# Patient Record
Sex: Female | Born: 1990 | Race: Black or African American | Hispanic: No | Marital: Married | Smoking: Never smoker
Health system: Southern US, Community
[De-identification: ages and names within clinical notes are randomized; demographics above are authoritative.]

## PROBLEM LIST (undated history)

## (undated) ENCOUNTER — Inpatient Hospital Stay: Payer: Self-pay

## (undated) DIAGNOSIS — G43909 Migraine, unspecified, not intractable, without status migrainosus: Secondary | ICD-10-CM

## (undated) DIAGNOSIS — F419 Anxiety disorder, unspecified: Secondary | ICD-10-CM

## (undated) DIAGNOSIS — R569 Unspecified convulsions: Secondary | ICD-10-CM

## (undated) DIAGNOSIS — Z98891 History of uterine scar from previous surgery: Secondary | ICD-10-CM

## (undated) DIAGNOSIS — D649 Anemia, unspecified: Secondary | ICD-10-CM

## (undated) DIAGNOSIS — R519 Headache, unspecified: Secondary | ICD-10-CM

## (undated) DIAGNOSIS — R112 Nausea with vomiting, unspecified: Secondary | ICD-10-CM

## (undated) DIAGNOSIS — Z9889 Other specified postprocedural states: Secondary | ICD-10-CM

## (undated) HISTORY — PX: THERAPEUTIC ABORTION: SHX798

## (undated) HISTORY — PX: TYMPANOSTOMY TUBE PLACEMENT: SHX32

## (undated) HISTORY — DX: Migraine, unspecified, not intractable, without status migrainosus: G43.909

## (undated) HISTORY — PX: WISDOM TOOTH EXTRACTION: SHX21

## (undated) HISTORY — PX: TONSILLECTOMY: SUR1361

---
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
## 2009-09-21 ENCOUNTER — Ambulatory Visit: Payer: Self-pay

## 2010-06-29 ENCOUNTER — Ambulatory Visit: Payer: Self-pay | Admitting: Obstetrics and Gynecology

## 2012-12-04 ENCOUNTER — Ambulatory Visit: Payer: Self-pay | Admitting: Internal Medicine

## 2013-01-28 ENCOUNTER — Encounter (INDEPENDENT_AMBULATORY_CARE_PROVIDER_SITE_OTHER): Payer: Self-pay

## 2013-01-28 ENCOUNTER — Ambulatory Visit (INDEPENDENT_AMBULATORY_CARE_PROVIDER_SITE_OTHER): Payer: BC Managed Care – PPO | Admitting: Internal Medicine

## 2013-01-28 ENCOUNTER — Encounter: Payer: Self-pay | Admitting: Internal Medicine

## 2013-01-28 VITALS — BP 114/74 | HR 81 | Temp 98.5°F | Ht 64.0 in | Wt 136.0 lb

## 2013-01-28 DIAGNOSIS — D509 Iron deficiency anemia, unspecified: Secondary | ICD-10-CM

## 2013-01-28 DIAGNOSIS — E538 Deficiency of other specified B group vitamins: Secondary | ICD-10-CM

## 2013-01-28 DIAGNOSIS — D649 Anemia, unspecified: Secondary | ICD-10-CM

## 2013-01-28 DIAGNOSIS — N926 Irregular menstruation, unspecified: Secondary | ICD-10-CM | POA: Insufficient documentation

## 2013-01-28 DIAGNOSIS — Z309 Encounter for contraceptive management, unspecified: Secondary | ICD-10-CM

## 2013-01-28 LAB — CBC WITH DIFFERENTIAL/PLATELET
Basophils Absolute: 0 10*3/uL (ref 0.0–0.1)
Eosinophils Absolute: 0 10*3/uL (ref 0.0–0.7)
HCT: 41.6 % (ref 36.0–46.0)
Hemoglobin: 13.8 g/dL (ref 12.0–15.0)
Lymphocytes Relative: 14 % (ref 12.0–46.0)
Lymphs Abs: 2 10*3/uL (ref 0.7–4.0)
MCHC: 33.3 g/dL (ref 30.0–36.0)
MCV: 89.7 fl (ref 78.0–100.0)
Monocytes Absolute: 0.6 10*3/uL (ref 0.1–1.0)
Monocytes Relative: 4.2 % (ref 3.0–12.0)
Neutro Abs: 11.3 10*3/uL — ABNORMAL HIGH (ref 1.4–7.7)
RBC: 4.63 Mil/uL (ref 3.87–5.11)
RDW: 12.8 % (ref 11.5–14.6)

## 2013-01-28 LAB — COMPREHENSIVE METABOLIC PANEL
ALT: 15 U/L (ref 0–35)
AST: 18 U/L (ref 0–37)
Alkaline Phosphatase: 48 U/L (ref 39–117)
BUN: 7 mg/dL (ref 6–23)
CO2: 25 mEq/L (ref 19–32)
Creatinine, Ser: 0.8 mg/dL (ref 0.4–1.2)
GFR: 96.82 mL/min (ref >60.00)
Sodium: 134 mEq/L — ABNORMAL LOW (ref 135–145)
Total Bilirubin: 0.7 mg/dL (ref 0.3–1.2)

## 2013-01-28 LAB — IRON AND TIBC
%SAT: 5 % — ABNORMAL LOW (ref 20–55)
Iron: 21 ug/dL — ABNORMAL LOW (ref 42–145)
UIBC: 386 ug/dL (ref 125–400)

## 2013-01-28 LAB — VITAMIN B12: Vitamin B-12: 189 pg/mL — ABNORMAL LOW (ref 211–911)

## 2013-01-28 NOTE — Progress Notes (Signed)
Patient ID: Brenda Wang, female   DOB: 1990/03/06, 22 y.o.   MRN: [id number]   Patient Active Problem List   Diagnosis Date Noted  . Anemia, iron deficiency 01/29/2013  . B12 deficiency 01/29/2013  . Unspecified contraceptive management 01/29/2013  . Menstrual irregularity 01/28/2013    Subjective:  CC:   Chief Complaint  Patient presents with  . Menstrual Problem    HPI:   Brenda Wang is a 22 y.o. female who presents as a new patient to establish primary care with the chief complaint of Menstrual irregularities for the past 2 months .   Bleeds for 2 days,  thens stops,  Then spots for a whole day  after 4 or 5 days of no bleeding .   Birth control used has not  changed since June. Had a therapeutic abortion in March and after that   Dr Madelin Headings started her on birth control in June.  She is  Not due for next PAP until June.  She Checks  A UPT monthly at the beginning of each  pack of pills. She is still actively involved with the father of her aborted baby.   Menstrual cycle pre initiation of hormonal contraceptive therapy was normal.  Menses lasted  5 days.  ,  Would start out light x 2 then heavy x 1 day,  then tapers off.  Menses are currently lighter and interrupted by days of no bleeding as previously described.     Lives independently ,since graduating from college in [place].  Degree in Math /Business   Does not exercise.  Gaylyn Rong snot engaged in regular exercise in over a  year.  Has gained 16 lbs  Since  June  .  Diet reviewed.  Lean meats ,   Lot of vegetables.      History reviewed. No pertinent past medical history.  Past Surgical History  Procedure Laterality Date  . Therapeutic abortion      Family History  Problem Relation Age of Onset  . Hypertension Paternal Grandmother   . Diabetes Paternal Grandmother   . Cancer Paternal Grandfather     colon ca    History   Social History  . Marital Status: Single    Spouse Name: N/A    Number of  Children: N/A  . Years of Education: N/A   Occupational History  . Not on file.   Social History Main Topics  . Smoking status: Never Smoker   . Smokeless tobacco: Not on file  . Alcohol Use: 2.4 oz/week    4 Glasses of wine per week  . Drug Use: No  . Sexual Activity: Yes   Other Topics Concern  . Not on file   Social History Narrative  . No narrative on file       @ALLHX @    Review of Systems:   The remainder of the review of systems was negative except those addressed in the HPI.       Objective:  BP 114/74  Pulse 81  Temp(Src) 98.5 F (36.9 C) (Oral)  Ht 5\' 4"  (1.626 m)  Wt 136 lb (61.689 kg)  BMI 23.33 kg/m2  SpO2 95%  General appearance: alert, cooperative and appears stated age Ears: normal TM's and external ear canals both ears Throat: lips, mucosa, and tongue normal; teeth and gums normal Neck: no adenopathy, no carotid bruit, supple, symmetrical, trachea midline and thyroid not enlarged, symmetric, no tenderness/mass/nodules Back: symmetric, no curvature. ROM normal. No CVA tenderness. Lungs: clear  to auscultation bilaterally Heart: regular rate and rhythm, S1, S2 normal, no murmur, click, rub or gallop Abdomen: soft, non-tender; bowel sounds normal; no masses,  no organomegaly Pulses: 2+ and symmetric Skin: Skin color, texture, turgor normal. No rashes or lesions Lymph nodes: Cervical, supraclavicular, and axillary nodes normal.  Assessment and Plan:  Anemia, iron deficiency adding iron supplement and changing OCP to Loestrin FE   B12 deficiency Advised to start IM injections.  Folate RBC level needed   Menstrual irregularity Thyroid function fine,  changing OCPS to Loestrin FE   Unspecified contraceptive management Changing hormonal therapy given irregular menstruation currently.    Updated Medication List Outpatient Encounter Prescriptions as of 01/28/2013  Medication Sig  . norethindrone-ethinyl estradiol-iron (MICROGESTIN  FE,GILDESS FE,LOESTRIN FE) 1.5-30 MG-MCG tablet Take 1 tablet by mouth daily.  . [DISCONTINUED] LORYNA 3-0.02 MG tablet      Orders Placed This Encounter  Procedures  . CBC with Differential  . TSH  . Ferritin  . Iron and TIBC  . Vitamin B12  . Comp Met (CMET)  . Folate RBC    No Follow-up on file.

## 2013-01-28 NOTE — Progress Notes (Signed)
Pre visit review using our clinic review tool, if applicable. No additional management support is needed unless otherwise documented below in the visit note. 

## 2013-01-28 NOTE — Patient Instructions (Signed)
I am checking your liver, kideny and thyroid function,  And iron stores today  If they are normal we will try changing your current oral contraceptive to see if the irregularity resolves.

## 2013-01-29 DIAGNOSIS — E538 Deficiency of other specified B group vitamins: Secondary | ICD-10-CM | POA: Insufficient documentation

## 2013-01-29 DIAGNOSIS — Z309 Encounter for contraceptive management, unspecified: Secondary | ICD-10-CM | POA: Insufficient documentation

## 2013-01-29 DIAGNOSIS — D509 Iron deficiency anemia, unspecified: Secondary | ICD-10-CM | POA: Insufficient documentation

## 2013-01-29 MED ORDER — NORETHIN ACE-ETH ESTRAD-FE 1.5-30 MG-MCG PO TABS: 1.0000 | ORAL_TABLET | Freq: Every day | ORAL | Status: DC

## 2013-01-29 NOTE — Assessment & Plan Note (Signed)
Thyroid function fine,  changing OCPS to Loestrin FE

## 2013-01-29 NOTE — Assessment & Plan Note (Signed)
adding iron supplement and changing OCP to Loestrin FE

## 2013-01-29 NOTE — Assessment & Plan Note (Signed)
Advised to start IM injections.  Folate RBC level needed

## 2013-01-30 NOTE — Assessment & Plan Note (Signed)
Changing hormonal therapy given irregular menstruation currently.

## 2013-01-31 ENCOUNTER — Ambulatory Visit (INDEPENDENT_AMBULATORY_CARE_PROVIDER_SITE_OTHER): Payer: BC Managed Care – PPO | Admitting: *Deleted

## 2013-01-31 DIAGNOSIS — D509 Iron deficiency anemia, unspecified: Secondary | ICD-10-CM

## 2013-01-31 DIAGNOSIS — E538 Deficiency of other specified B group vitamins: Secondary | ICD-10-CM

## 2013-01-31 MED ORDER — CYANOCOBALAMIN 1000 MCG/ML IJ SOLN
1000.0000 ug | Freq: Once | INTRAMUSCULAR | Status: AC
  Administered 2013-01-31: 1000 ug via INTRAMUSCULAR

## 2013-02-01 LAB — FOLATE RBC: RBC Folate: 494 ng/mL (ref >280)

## 2013-02-03 ENCOUNTER — Encounter: Payer: Self-pay | Admitting: Internal Medicine

## 2013-02-03 ENCOUNTER — Telehealth: Payer: Self-pay | Admitting: Internal Medicine

## 2013-02-03 NOTE — Telephone Encounter (Signed)
The patient's iron supplement was not called into the pharmacy. She stated that it should have been called into the pharmacy after her last visit.

## 2013-02-03 NOTE — Telephone Encounter (Signed)
Advised pt OTC

## 2013-02-10 ENCOUNTER — Ambulatory Visit (INDEPENDENT_AMBULATORY_CARE_PROVIDER_SITE_OTHER): Payer: BC Managed Care – PPO | Admitting: *Deleted

## 2013-02-10 DIAGNOSIS — E538 Deficiency of other specified B group vitamins: Secondary | ICD-10-CM

## 2013-02-10 MED ORDER — CYANOCOBALAMIN 1000 MCG/ML IJ SOLN
1000.0000 ug | Freq: Once | INTRAMUSCULAR | Status: AC
  Administered 2013-02-10: 1000 ug via INTRAMUSCULAR

## 2013-02-18 ENCOUNTER — Telehealth: Payer: Self-pay | Admitting: *Deleted

## 2013-02-18 ENCOUNTER — Ambulatory Visit (INDEPENDENT_AMBULATORY_CARE_PROVIDER_SITE_OTHER): Payer: BC Managed Care – PPO | Admitting: *Deleted

## 2013-02-18 DIAGNOSIS — E538 Deficiency of other specified B group vitamins: Secondary | ICD-10-CM

## 2013-02-18 MED ORDER — CYANOCOBALAMIN 1000 MCG SL SUBL: 1.0000 | SUBLINGUAL_TABLET | Freq: Every day | SUBLINGUAL | Status: DC

## 2013-02-18 MED ORDER — CYANOCOBALAMIN 1000 MCG/ML IJ SOLN
1000.0000 ug | Freq: Once | INTRAMUSCULAR | Status: AC
  Administered 2013-02-18: 1000 ug via INTRAMUSCULAR

## 2013-02-18 NOTE — Telephone Encounter (Signed)
Sublingual tablets sent to [facility].  Take daily.  Repeat b12 level in 3 months

## 2013-02-18 NOTE — Telephone Encounter (Signed)
Patient Third weekly B-12 given today patient stated she was advised last visit that after third injection may be able to take sublingual B-12 Please advise.

## 2013-02-21 NOTE — Telephone Encounter (Signed)
Pharmacy switched as requested.

## 2013-02-21 NOTE — Telephone Encounter (Signed)
Pt came into clinic asking if we can switch her pharmacy:  [facility]

## 2013-05-15 ENCOUNTER — Telehealth: Payer: Self-pay | Admitting: *Deleted

## 2013-05-15 DIAGNOSIS — Z79899 Other long term (current) drug therapy: Secondary | ICD-10-CM

## 2013-05-15 DIAGNOSIS — E785 Hyperlipidemia, unspecified: Secondary | ICD-10-CM

## 2013-05-15 DIAGNOSIS — D72829 Elevated white blood cell count, unspecified: Secondary | ICD-10-CM

## 2013-05-15 NOTE — Telephone Encounter (Signed)
Pt is coming in on Monday what labs and dx? 

## 2013-05-19 ENCOUNTER — Other Ambulatory Visit (INDEPENDENT_AMBULATORY_CARE_PROVIDER_SITE_OTHER): Payer: BC Managed Care – PPO

## 2013-05-19 DIAGNOSIS — D72829 Elevated white blood cell count, unspecified: Secondary | ICD-10-CM

## 2013-05-19 DIAGNOSIS — Z79899 Other long term (current) drug therapy: Secondary | ICD-10-CM

## 2013-05-19 DIAGNOSIS — E785 Hyperlipidemia, unspecified: Secondary | ICD-10-CM

## 2013-05-19 DIAGNOSIS — E876 Hypokalemia: Secondary | ICD-10-CM

## 2013-05-19 LAB — COMPREHENSIVE METABOLIC PANEL
ALBUMIN: 3.5 g/dL (ref 3.5–5.2)
ALT: 29 U/L (ref 0–35)
AST: 21 U/L (ref 0–37)
Alkaline Phosphatase: 47 U/L (ref 39–117)
BILIRUBIN TOTAL: 0.5 mg/dL (ref 0.3–1.2)
BUN: 6 mg/dL (ref 6–23)
CO2: 27 meq/L (ref 19–32)
Calcium: 8.4 mg/dL (ref 8.4–10.5)
Chloride: 105 mEq/L (ref 96–112)
Creatinine, Ser: 0.7 mg/dL (ref 0.4–1.2)
GFR: 113.13 mL/min (ref >60.00)
GLUCOSE: 90 mg/dL (ref 70–99)
POTASSIUM: 3.2 meq/L — AB (ref 3.5–5.1)
SODIUM: 137 meq/L (ref 135–145)
Total Protein: 6.4 g/dL (ref 6.0–8.3)

## 2013-05-19 LAB — LIPID PANEL
CHOL/HDL RATIO: 2
CHOLESTEROL: 97 mg/dL (ref 0–200)
HDL: 43 mg/dL (ref >39.00)
LDL Cholesterol: 49 mg/dL (ref 0–99)
Triglycerides: 24 mg/dL (ref 0.0–149.0)
VLDL: 4.8 mg/dL (ref 0.0–40.0)

## 2013-05-19 LAB — CBC WITH DIFFERENTIAL/PLATELET
Basophils Absolute: 0 10*3/uL (ref 0.0–0.1)
Basophils Relative: 0.5 % (ref 0.0–3.0)
EOS ABS: 0 10*3/uL (ref 0.0–0.7)
EOS PCT: 0.6 % (ref 0.0–5.0)
HCT: 37.8 % (ref 36.0–46.0)
HEMOGLOBIN: 12.8 g/dL (ref 12.0–15.0)
LYMPHS PCT: 32.2 % (ref 12.0–46.0)
Lymphs Abs: 1.7 10*3/uL (ref 0.7–4.0)
MCHC: 33.8 g/dL (ref 30.0–36.0)
MCV: 90.8 fl (ref 78.0–100.0)
MONOS PCT: 5.7 % (ref 3.0–12.0)
Monocytes Absolute: 0.3 10*3/uL (ref 0.1–1.0)
NEUTROS ABS: 3.3 10*3/uL (ref 1.4–7.7)
NEUTROS PCT: 61 % (ref 43.0–77.0)
Platelets: 226 10*3/uL (ref 150.0–400.0)
RBC: 4.16 Mil/uL (ref 3.87–5.11)
RDW: 13.2 % (ref 11.5–14.6)
WBC: 5.4 10*3/uL (ref 4.5–10.5)

## 2013-05-20 ENCOUNTER — Encounter: Payer: Self-pay | Admitting: Internal Medicine

## 2013-05-20 MED ORDER — POTASSIUM CHLORIDE CRYS ER 20 MEQ PO TBCR: 20.0000 meq | EXTENDED_RELEASE_TABLET | Freq: Every day | ORAL | Status: DC

## 2013-05-20 NOTE — Addendum Note (Signed)
Addended by: Crecencio Mc on: 05/20/2013 07:45 AM   Modules accepted: Orders

## 2014-01-21 ENCOUNTER — Observation Stay: Payer: Self-pay

## 2014-01-23 ENCOUNTER — Inpatient Hospital Stay: Payer: Self-pay | Admitting: Obstetrics and Gynecology

## 2014-01-23 DIAGNOSIS — Z98891 History of uterine scar from previous surgery: Secondary | ICD-10-CM

## 2014-01-23 LAB — DRUG SCREEN, URINE
Amphetamines, Ur Screen: NEGATIVE (ref ?–1000)
Barbiturates, Ur Screen: NEGATIVE (ref ?–200)
Benzodiazepine, Ur Scrn: NEGATIVE (ref ?–200)
Cannabinoid 50 Ng, Ur ~~LOC~~: NEGATIVE (ref ?–50)
Cocaine Metabolite,Ur ~~LOC~~: NEGATIVE (ref ?–300)
MDMA (ECSTASY) UR SCREEN: NEGATIVE (ref ?–500)
METHADONE, UR SCREEN: NEGATIVE (ref ?–300)
Opiate, Ur Screen: NEGATIVE (ref ?–300)
Phencyclidine (PCP) Ur S: NEGATIVE (ref ?–25)
Tricyclic, Ur Screen: NEGATIVE (ref ?–1000)

## 2014-01-23 LAB — PIH PROFILE
AST: 17 U/L (ref 15–37)
Anion Gap: 6 — ABNORMAL LOW (ref 7–16)
BUN: 7 mg/dL (ref 7–18)
CO2: 28 mmol/L (ref 21–32)
Calcium, Total: 8 mg/dL — ABNORMAL LOW (ref 8.5–10.1)
Chloride: 104 mmol/L (ref 98–107)
Creatinine: 0.82 mg/dL (ref 0.60–1.30)
EGFR (African American): >60
EGFR (Non-African Amer.): >60
GLUCOSE: 80 mg/dL (ref 65–99)
HCT: 37.5 % (ref 35.0–47.0)
HGB: 12 g/dL (ref 12.0–16.0)
MCH: 28.7 pg (ref 26.0–34.0)
MCHC: 32 g/dL (ref 32.0–36.0)
MCV: 90 fL (ref 80–100)
Osmolality: 273 (ref 275–301)
Platelet: 194 10*3/uL (ref 150–440)
Potassium: 3.6 mmol/L (ref 3.5–5.1)
RBC: 4.19 10*6/uL (ref 3.80–5.20)
RDW: 13.6 % (ref 11.5–14.5)
SODIUM: 138 mmol/L (ref 136–145)
Uric Acid: 4.5 mg/dL (ref 2.6–6.0)
WBC: 15.2 10*3/uL — AB (ref 3.6–11.0)

## 2014-01-23 LAB — PROTEIN / CREATININE RATIO, URINE
CREATININE, URINE: 196.6 mg/dL — AB (ref 30.0–125.0)
Protein, Random Urine: 37 mg/dL — ABNORMAL HIGH (ref 0–12)
Protein/Creat. Ratio: 188 mg/gCREAT (ref 0–200)

## 2014-01-24 LAB — HEMATOCRIT: HCT: 36.4 % (ref 35.0–47.0)

## 2014-06-10 NOTE — Op Note (Signed)
PATIENT NAME:  Brenda Wang, WALPOLE MR#:  [id number] DATE OF BIRTH:  10/17/1990  DATE OF PROCEDURE:  01/23/2014  PREOPERATIVE DIAGNOSES: 55. A 24 year old G2, P0-0-1-0 at 23 weeks' and 2 days' gestation.  2. Gestational hypertension.  3. Fetal intolerance to labor.   POSTOPERATIVE DIAGNOSES: 71.  A 24 year old G2, P0-0-1-0 at 40 weeks and 2 days gestation.  2.  Gestational hypertension.  3.  Fetal intolerance to labor.  4.  Including nuchal cords x 2.  OPERATION PERFORMED:  Low transverse cesarean section via Pfannenstiel skin incision.  ANESTHESIA:  Spinal.   PRIMARY SURGEON: Dorthula Nettles, M.D.   ESTIMATED BLOOD LOSS:  600 mL.  OPERATIVE FLUIDS:  1 liter.   URINE OUTPUT:  150 mL.   COMPLICATIONS:  None.  PREOPERATIVE ANTIBIOTICS:  2 gm of Ancef.   DRAINS OR TUBES:  Foley to gravity drainage, On-Q catheter system.   IMPLANTS: None.   INTRAOPERATIVE FINDINGS: Normal tubes, ovaries, and uterus. Female infant weighing 3460 gm; 7 pounds 10 ounces. Apgars 8 and 9, with 2 tight nuchal cords.   SPECIMENS REMOVED:  None.  PATIENT CONDITION FOLLOWING PROCEDURE: Stable.   PROCEDURE IN DETAIL: Risks, benefits, and alternatives, as well as the fetal heart rate tracing findings were discussed with the patient prior to proceeding to the Operating Room. The patient was taken to the Operating Room, where she was placed under spinal anesthesia. She was positioned in the supine position, prepped and draped in the usual sterile fashion. The level of anesthetic was checked prior to proceeding with the case, and a timeout was performed. A Pfannenstiel skin incision was made 2 cm above the pubic symphysis, carried down sharply to the level of the rectus fascia. The fascia was incised in the midline using a knife and the fascial incision extended using Mayo scissors. The superior border of the rectus fascia was grasped with 2 Kocher clamps. The underlying rectus muscles were dissected off the  fascia using blunt dissection. The median raphe was incised using Mayo scissors. The inferior border of the rectus fascia was dissected off the rectus muscle in a similar fashion. The midline was identified. The peritoneum was entered bluntly, and the peritoneal incision was extended using manual traction. The bladder blade was placed to displace the bladder caudally, and a low transverse incision was scored on the lower uterine segment. The hysterotomy incision was entered bluntly using the operator's finger, then extended using manual traction. Upon placing the operator's hand into the hysterotomy incision, the infant was noted to be in the OA position. The vertex was grasped, flexed, brought to the incision, and delivered atraumatically using fundal pressure. Two nuchal cords were noted. These were tight, but able to be reduced at the time of delivery, and the remainder of the body delivered with ease. Uterus was exteriorized, wiped clean of clots and debris using two moist laps. The hysterotomy incision was then repaired using a two-layer closure, with the 1st being a 0 Vicryl running locked, the 2nd, a 0 Vicryl vertical imbricating. Following hysterotomy closure, the uterus was returned to the abdomen. The peritoneal gutters were wiped clean of clots and debris using two moist laps. The peritoneum was closed using 2-0 Vicryl in a running fashion. The rectus muscles were reapproximated in the midline using 2-0 Vicryl mattress stitch. The On-Q catheters were then placed subfascially per the usual protocol. The fascia was closed using a looped #1 PDS in a running fashion. Subcutaneous tissue was irrigated. Hemostasis was achieved using the  Bovie. Skin was closed using a 4-0 Monocryl in a subcuticular fashion. Each On-Q catheter was bolused with 5 mL of 0.5% bupivacaine each. Sponge, needle, and instrument counts were correct x 2. The patient tolerated the procedure well and was taken to the recovery room in stable  condition    ____________________________ Stoney Bang. Georgianne Fick, MD ams:mw D: 01/24/2014 06:27:09 ET T: 01/24/2014 06:40:49 ET JOB#: [id number]  cc: Stoney Bang. Georgianne Fick, MD, <Dictator> Conan Bowens Madelon Lips MD ELECTRONICALLY SIGNED 02/24/2014 15:35

## 2014-06-23 NOTE — H&P (Signed)
L&D Evaluation:  History:  HPI 24 year old G2 p0010 with EDC=01/21/2014 by a 15 week scan presents with c/o LOF today since 1100. She has been having to change panties or panty liner today several times because she felt LOF which she noticed more after voiding. Denies vulvar  itching or irritation. Denies VB or contraction pain. Baby active. Pregancy has been c/b persistent nausea and vomiting; still vomits an average of 1-2 x/day.  She also has reflux for which she takes protonix. Has been anemic, but she stopped her Fe and vitamins because of constipation. LABS: O POS, RI, VI, GBS positive.   Presents with leaking fluid   Patient's Medical History Anemia   Patient's Surgical History none   Medications protonix   Allergies Sulfa, hives   Social History MJ prior to pregnancy   Family History Non-Contributory   ROS:  ROS see HPI   Exam:  Vital Signs stable  120/71   Urine Protein not completed   General no apparent distress   Mental Status clear   Abdomen gravid, non-tender   Estimated Fetal Weight Average for gestational age   Fetal Position cephalic   Pelvic mild inflammation and swelling of the labia minora. off white-yellow cottage cheese discharge adherent to walls of vagina. Small amt mucous at cx os. CX: 1/50%/-2   Mebranes Intact, Equivocal Nitrazine and negative fern   FHT normal rate with no decels, 135 baseline with accels to 160s   Ucx irregular, q3, q5, q7 and mild   Skin dry   Other AFI=15.41 cm   Impression:  Impression IUP at 40 weeks with a monilial vulvovaginitis. NO evidence of SROM.   Plan:  Plan DC home with labor precautions FU at [facility] tomorrow.   Electronic Signatures: Karene Fry (CNM)  (Signed 09-Dec-15 23:39)  Authored: L&D Evaluation   Last Updated: 09-Dec-15 23:39 by Karene Fry (CNM)

## 2014-06-23 NOTE — H&P (Signed)
L&D Evaluation:  History:  HPI 24 yo G2P0010 at [redacted]w[redacted]d gestation  by 40 week [place] of 01/21/2014 presenting with contractions.  Cervix unchanged from prior checks at 1cm, but BP elevated on presentation.  Patient denies HA, vision changes, RUQ or epigastic pain, increased edema.  +FM, no LOF, no VB.  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  Atypical hypermesis this pregnancy   Patient's Medical History anemia   Patient's Surgical History none   Medications Pre Natal Vitamins  protonix   Allergies NKDA   Social History none   Family History Non-Contributory   ROS:  ROS All systems were reviewed.  HEENT, CNS, GI, GU, Respiratory, CV, Renal and Musculoskeletal systems were found to be normal.   Exam:  Vital Signs BP >140/90   Urine Protein P/C ratio pending   General no apparent distress   Mental Status clear   Abdomen gravid, non-tender   Estimated Fetal Weight Average for gestational age   Fetal Position vtx   Back no CVAT   Edema trace BLE   Pelvic no external lesions, 1/50/-3   Mebranes Intact   Description 150, moderate, postive accels, has had some subtle decelerations including some looking late in timine - category II tracing   FHT normal rate with no decels   Ucx regular, q56min   Skin dry, no lesions   Impression:  Impression 24 yo G2P0010 at [redacted]w[redacted]d with GHTN vs mild preEclampsia   Plan:  Comments 1) GHTN vs mild preeclampsia - intial labs normal, mild range blood pressures.  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

## 2014-09-28 ENCOUNTER — Ambulatory Visit (INDEPENDENT_AMBULATORY_CARE_PROVIDER_SITE_OTHER): Payer: BC Managed Care – PPO | Admitting: Internal Medicine

## 2014-09-28 ENCOUNTER — Encounter: Payer: Self-pay | Admitting: Internal Medicine

## 2014-09-28 VITALS — BP 102/60 | HR 86 | Temp 97.8°F | Resp 12 | Ht 64.0 in | Wt 178.2 lb

## 2014-09-28 DIAGNOSIS — D509 Iron deficiency anemia, unspecified: Secondary | ICD-10-CM | POA: Diagnosis not present

## 2014-09-28 DIAGNOSIS — E538 Deficiency of other specified B group vitamins: Secondary | ICD-10-CM

## 2014-09-28 DIAGNOSIS — L731 Pseudofolliculitis barbae: Secondary | ICD-10-CM

## 2014-09-28 DIAGNOSIS — Z3040 Encounter for surveillance of contraceptives, unspecified: Secondary | ICD-10-CM | POA: Diagnosis not present

## 2014-09-28 DIAGNOSIS — L739 Follicular disorder, unspecified: Principal | ICD-10-CM

## 2014-09-28 DIAGNOSIS — B965 Pseudomonas (aeruginosa) (mallei) (pseudomallei) as the cause of diseases classified elsewhere: Secondary | ICD-10-CM | POA: Insufficient documentation

## 2014-09-28 LAB — CBC WITH DIFFERENTIAL/PLATELET
Basophils Absolute: 0 10*3/uL (ref 0.0–0.1)
Basophils Relative: 0.5 % (ref 0.0–3.0)
EOS ABS: 0.1 10*3/uL (ref 0.0–0.7)
Eosinophils Relative: 1.7 % (ref 0.0–5.0)
HEMATOCRIT: 42.3 % (ref 36.0–46.0)
Hemoglobin: 13.9 g/dL (ref 12.0–15.0)
LYMPHS ABS: 1.7 10*3/uL (ref 0.7–4.0)
Lymphocytes Relative: 30.2 % (ref 12.0–46.0)
MCHC: 32.8 g/dL (ref 30.0–36.0)
MCV: 88.5 fl (ref 78.0–100.0)
MONOS PCT: 5 % (ref 3.0–12.0)
Monocytes Absolute: 0.3 10*3/uL (ref 0.1–1.0)
NEUTROS ABS: 3.6 10*3/uL (ref 1.4–7.7)
Neutrophils Relative %: 62.6 % (ref 43.0–77.0)
PLATELETS: 227 10*3/uL (ref 150.0–400.0)
RBC: 4.78 Mil/uL (ref 3.87–5.11)
RDW: 14.4 % (ref 11.5–15.5)
WBC: 5.8 10*3/uL (ref 4.0–10.5)

## 2014-09-28 LAB — SEDIMENTATION RATE: Sed Rate: 14 mm/hr (ref 0–22)

## 2014-09-28 LAB — IBC PANEL
IRON: 96 ug/dL (ref 42–145)
Saturation Ratios: 27.1 % (ref 20.0–50.0)
Transferrin: 253 mg/dL (ref 212.0–360.0)

## 2014-09-28 LAB — COMPLETE METABOLIC PANEL WITH GFR
ALT: 23 U/L (ref 6–29)
AST: 20 U/L (ref 10–30)
Albumin: 3.6 g/dL (ref 3.6–5.1)
Alkaline Phosphatase: 72 U/L (ref 33–115)
BUN: 7 mg/dL (ref 7–25)
CHLORIDE: 104 mmol/L (ref 98–110)
CO2: 22 mmol/L (ref 20–31)
CREATININE: 1.08 mg/dL (ref 0.50–1.10)
Calcium: 8.9 mg/dL (ref 8.6–10.2)
GFR, EST AFRICAN AMERICAN: 82 mL/min (ref >=60)
GFR, Est Non African American: 72 mL/min (ref >=60)
GLUCOSE: 90 mg/dL (ref 65–99)
Potassium: 3.9 mmol/L (ref 3.5–5.3)
SODIUM: 135 mmol/L (ref 135–146)
Total Bilirubin: 0.7 mg/dL (ref 0.2–1.2)
Total Protein: 6.6 g/dL (ref 6.1–8.1)

## 2014-09-28 LAB — VITAMIN B12: Vitamin B-12: 270 pg/mL (ref 211–911)

## 2014-09-28 MED ORDER — CIPROFLOXACIN HCL 250 MG PO TABS: 250.0000 mg | ORAL_TABLET | Freq: Two times a day (BID) | ORAL | Status: DC

## 2014-09-28 MED ORDER — TRIAMCINOLONE ACETONIDE 0.1 % EX CREA: 1.0000 "application " | TOPICAL_CREAM | Freq: Two times a day (BID) | CUTANEOUS | Status: DC

## 2014-09-28 NOTE — Progress Notes (Signed)
Pre-visit discussion using our clinic review tool. No additional management support is needed unless otherwise documented below in the visit note.  

## 2014-09-28 NOTE — Progress Notes (Signed)
Subjective:  Patient ID: Brenda Wang, female    DOB: 09/19/1990  Age: 24 y.o. MRN: [id number]  CC: The primary encounter diagnosis was Pseudomonas folliculitis. Diagnoses of Anemia, iron deficiency, B12 deficiency, and Encounter for surveillance of contraceptives were also pertinent to this visit.  HPI Brenda Wang presents for  New onset slowly spreading  pruritic rash  Which started on her right thigh just above the knee.   The rash began one week ago.  And has spread to  the the lower leg and the left leg after starting from one papule.  She denies fevers, lymphadenopathy and headaches.   New medicatiions :  Sarted OCPs for birth control a month ago,  Previously on Depo provera, prior to that was on oral contraceptives.   ExposuresL: [place] theme park 2 days before the the onset of the rash and went swimming in the public pool at the water park  and on the rides.  No contact with poison ivy. NO new soaps,moisturizers or detergents , no dietary changes  Outpatient Prescriptions Prior to Visit  Medication Sig Dispense Refill  . Cyanocobalamin 1000 MCG SUBL Place 1 tablet (1,000 mcg total) under the tongue daily. (Patient not taking: Reported on 09/28/2014) 90 tablet 3  . potassium chloride SA (K-DUR,KLOR-CON) 20 MEQ tablet Take 1 tablet (20 mEq total) by mouth daily. (Patient not taking: Reported on 09/28/2014) 7 tablet 0  . norethindrone-ethinyl estradiol-iron (MICROGESTIN FE,GILDESS FE,LOESTRIN FE) 1.5-30 MG-MCG tablet Take 1 tablet by mouth daily. (Patient not taking: Reported on 09/28/2014) 1 Package 11   No facility-administered medications prior to visit.    Review of Systems;  Patient denies headache, fevers, malaise, unintentional weight loss, skin rash, eye pain, sinus congestion and sinus pain, sore throat, dysphagia,  hemoptysis , cough, dyspnea, wheezing, chest pain, palpitations, orthopnea, edema, abdominal pain, nausea, melena, diarrhea, constipation, flank  pain, dysuria, hematuria, urinary  Frequency, nocturia, numbness, tingling, seizures,  Focal weakness, Loss of consciousness,  Tremor, insomnia, depression, anxiety, and suicidal ideation.      Objective:  BP 102/60 mmHg  Pulse 86  Temp(Src) 97.8 F (36.6 C) (Oral)  Resp 12  Ht 5\' 4"  (1.626 m)  Wt 178 lb 4 oz (80.854 kg)  BMI 30.58 kg/m2  SpO2 98%  LMP 09/11/2014 (Approximate)  BP Readings from Last 3 Encounters:  09/28/14 102/60  01/28/13 114/74    Wt Readings from Last 3 Encounters:  09/28/14 178 lb 4 oz (80.854 kg)  01/28/13 136 lb (61.689 kg)    General appearance: alert, cooperative and appears stated age  Neck: no adenopathy, no carotid bruit, supple, symmetrical, trachea midline and thyroid not enlarged, symmetric, no tenderness/mass/nodules Lungs: clear to auscultation bilaterally Heart: regular rate and rhythm, S1, S2 normal, no murmur, click, rub or gallop Skin: papular rash , herpetiform distribution left thigh , with singular papules on lower leg and left thigh   Lymph nodes: Cervical, supraclavicular, and axillary nodes normal.  No results found for: HGBA1C  Lab Results  Component Value Date   CREATININE 0.82 01/23/2014   CREATININE 0.7 05/19/2013   CREATININE 0.8 01/28/2013    Lab Results  Component Value Date   WBC 5.8 09/28/2014   HGB 13.9 09/28/2014   HCT 42.3 09/28/2014   PLT 227.0 09/28/2014   GLUCOSE 80 01/23/2014   CHOL 97 05/19/2013   TRIG 24.0 05/19/2013   HDL 43.00 05/19/2013   LDLCALC 49 05/19/2013   ALT 29 05/19/2013   AST 17  01/23/2014   NA 138 01/23/2014   K 3.6 01/23/2014   CL 104 01/23/2014   CREATININE 0.82 01/23/2014   BUN 7 01/23/2014   CO2 28 01/23/2014   TSH 1.86 01/28/2013    No results found.  Assessment & Plan:   Problem List Items Addressed This Visit      Unprioritized   Contraceptive management   Relevant Orders   COMPLETE METABOLIC PANEL WITH GFR   Pseudomonas folliculitis - Primary    Suspected  since rash started after swimming and riding at [employer]. Will treat with cipro x 7 days,      Relevant Medications   ciprofloxacin (CIPRO) 250 MG tablet   Other Relevant Orders   Sedimentation rate (Completed)   Anemia, iron deficiency   Relevant Orders   CBC with Differential/Platelet (Completed)   IBC panel (Completed)   B12 deficiency   Relevant Orders   Vitamin B12 (Completed)      I have discontinued Brenda Wang's norethindrone-ethinyl estradiol-iron. I am also having her start on ciprofloxacin and triamcinolone cream. Additionally, I am having her maintain her Cyanocobalamin, potassium chloride SA, and norethindrone-ethinyl estradiol.  Meds ordered this encounter  Medications  . norethindrone-ethinyl estradiol (JUNEL FE,GILDESS FE,LOESTRIN FE) 1-20 MG-MCG tablet    Sig: Take 1 tablet by mouth daily.  . ciprofloxacin (CIPRO) 250 MG tablet    Sig: Take 1 tablet (250 mg total) by mouth 2 (two) times daily.    Dispense:  10 tablet    Refill:  0  . triamcinolone cream (KENALOG) 0.1 %    Sig: Apply 1 application topically 2 (two) times daily.    Dispense:  30 g    Refill:  0    Medications Discontinued During This Encounter  Medication Reason  . norethindrone-ethinyl estradiol-iron (MICROGESTIN FE,GILDESS FE,LOESTRIN FE) 1.5-30 MG-MCG tablet Change in therapy    Follow-up: No Follow-up on file.   Crecencio Mc, MD

## 2014-09-28 NOTE — Assessment & Plan Note (Signed)
Suspected since rash started after swimming and riding [place] [employer]. Will treat with cipro x 7 days,

## 2014-09-28 NOTE — Patient Instructions (Signed)
I am treating you for follicultis with cipro (oral antibiotic) Please take a probiotic ( Align, Floraque or Culturelle) while you are on the antibiotic to prevent a serious antibiotic associated diarrhea  Called clostirudium dificile colitis and a vaginal yeast infection   If the rash does not resolve in 2 weeks,  Let me know and we will try an alternative treatment.     Folliculitis  Folliculitis is redness, soreness, and swelling (inflammation) of the hair follicles. This condition can occur anywhere on the body. People with weakened immune systems, diabetes, or obesity have a greater risk of getting folliculitis. CAUSES  Bacterial infection. This is the most common cause.  Fungal infection.  Viral infection.  Contact with certain chemicals, especially oils and tars. Long-term folliculitis can result from bacteria that live in the nostrils. The bacteria may trigger multiple outbreaks of folliculitis over time. SYMPTOMS Folliculitis most commonly occurs on the scalp, thighs, legs, back, buttocks, and areas where hair is shaved frequently. An early sign of folliculitis is a small, white or yellow, pus-filled, itchy lesion (pustule). These lesions appear on a red, inflamed follicle. They are usually less than 0.2 inches (5 mm) wide. When there is an infection of the follicle that goes deeper, it becomes a boil or furuncle. A group of closely packed boils creates a larger lesion (carbuncle). Carbuncles tend to occur in hairy, sweaty areas of the body. DIAGNOSIS  Your caregiver can usually tell what is wrong by doing a physical exam. A sample may be taken from one of the lesions and tested in a lab. This can help determine what is causing your folliculitis. TREATMENT  Treatment may include:  Applying warm compresses to the affected areas.  Taking antibiotic medicines orally or applying them to the skin.  Draining the lesions if they contain a large amount of pus or fluid.  Laser hair  removal for cases of long-lasting folliculitis. This helps to prevent regrowth of the hair. HOME CARE INSTRUCTIONS  Apply warm compresses to the affected areas as directed by your caregiver.  If antibiotics are prescribed, take them as directed. Finish them even if you start to feel better.  You may take over-the-counter medicines to relieve itching.  Do not shave irritated skin.  Follow up with your caregiver as directed. SEEK IMMEDIATE MEDICAL CARE IF:   You have increasing redness, swelling, or pain in the affected area.  You have a fever. MAKE SURE YOU:  Understand these instructions.  Will watch your condition.  Will get help right away if you are not doing well or get worse. Document Released: 04/10/2001 Document Revised: 08/01/2011 Document Reviewed: 05/02/2011 [facility] Patient Information 2015 [place], [place]. This information is not intended to replace advice given to you by your health care provider. Make sure you discuss any questions you have with your health care provider.

## 2014-09-29 ENCOUNTER — Other Ambulatory Visit: Payer: Self-pay | Admitting: Internal Medicine

## 2014-09-29 ENCOUNTER — Encounter: Payer: Self-pay | Admitting: Internal Medicine

## 2014-10-14 NOTE — Telephone Encounter (Signed)
Mailed unread message to patient.  

## 2014-12-30 LAB — HM PAP SMEAR: HM PAP: NEGATIVE

## 2015-01-02 LAB — OB RESULTS CONSOLE HIV ANTIBODY (ROUTINE TESTING): HIV: NONREACTIVE

## 2015-01-02 LAB — OB RESULTS CONSOLE HEPATITIS B SURFACE ANTIGEN: Hepatitis B Surface Ag: NEGATIVE

## 2015-01-02 LAB — OB RESULTS CONSOLE VARICELLA ZOSTER ANTIBODY, IGG: Varicella: IMMUNE

## 2015-01-02 LAB — OB RESULTS CONSOLE RPR: RPR: NONREACTIVE

## 2015-01-02 LAB — OB RESULTS CONSOLE RUBELLA ANTIBODY, IGM: RUBELLA: IMMUNE

## 2015-02-11 ENCOUNTER — Emergency Department
Admission: EM | Admit: 2015-02-11 | Discharge: 2015-02-11 | Disposition: A | Discharge status: Home / Self Care | Payer: BC Managed Care – PPO | Attending: Emergency Medicine | Admitting: Emergency Medicine

## 2015-02-11 ENCOUNTER — Encounter: Payer: Self-pay | Admitting: Emergency Medicine

## 2015-02-11 DIAGNOSIS — O210 Mild hyperemesis gravidarum: Secondary | ICD-10-CM | POA: Insufficient documentation

## 2015-02-11 DIAGNOSIS — O219 Vomiting of pregnancy, unspecified: Secondary | ICD-10-CM

## 2015-02-11 DIAGNOSIS — Z792 Long term (current) use of antibiotics: Secondary | ICD-10-CM | POA: Insufficient documentation

## 2015-02-11 DIAGNOSIS — Z79899 Other long term (current) drug therapy: Secondary | ICD-10-CM | POA: Diagnosis not present

## 2015-02-11 DIAGNOSIS — Z3A15 15 weeks gestation of pregnancy: Secondary | ICD-10-CM | POA: Diagnosis not present

## 2015-02-11 LAB — CBC WITH DIFFERENTIAL/PLATELET
Basophils Absolute: 0 10*3/uL (ref 0–0.1)
Basophils Relative: 0 %
Eosinophils Absolute: 0 10*3/uL (ref 0–0.7)
Eosinophils Relative: 0 %
HCT: 37.5 % (ref 35.0–47.0)
Hemoglobin: 12.6 g/dL (ref 12.0–16.0)
Lymphocytes Relative: 10 %
Lymphs Abs: 0.8 10*3/uL — ABNORMAL LOW (ref 1.0–3.6)
MCH: 28.6 pg (ref 26.0–34.0)
MCHC: 33.6 g/dL (ref 32.0–36.0)
MCV: 85.1 fL (ref 80.0–100.0)
Monocytes Absolute: 0.3 10*3/uL (ref 0.2–0.9)
Monocytes Relative: 4 %
Neutro Abs: 7.4 10*3/uL — ABNORMAL HIGH (ref 1.4–6.5)
Neutrophils Relative %: 86 %
Platelets: 209 10*3/uL (ref 150–440)
RBC: 4.41 MIL/uL (ref 3.80–5.20)
RDW: 13.3 % (ref 11.5–14.5)
WBC: 8.5 10*3/uL (ref 3.6–11.0)

## 2015-02-11 LAB — URINALYSIS COMPLETE WITH MICROSCOPIC (ARMC ONLY)
BACTERIA UA: NONE SEEN
BILIRUBIN URINE: NEGATIVE
GLUCOSE, UA: NEGATIVE mg/dL
Hgb urine dipstick: NEGATIVE
NITRITE: NEGATIVE
Protein, ur: 30 mg/dL — AB
RBC / HPF: NONE SEEN RBC/hpf (ref 0–5)
SPECIFIC GRAVITY, URINE: 1.031 — AB (ref 1.005–1.030)
pH: 5 (ref 5.0–8.0)

## 2015-02-11 LAB — BASIC METABOLIC PANEL
ANION GAP: 7 (ref 5–15)
BUN: 8 mg/dL (ref 6–20)
CALCIUM: 8.1 mg/dL — AB (ref 8.9–10.3)
CO2: 23 mmol/L (ref 22–32)
CREATININE: 0.66 mg/dL (ref 0.44–1.00)
Chloride: 102 mmol/L (ref 101–111)
Glucose, Bld: 96 mg/dL (ref 65–99)
Potassium: 3 mmol/L — ABNORMAL LOW (ref 3.5–5.1)
SODIUM: 132 mmol/L — AB (ref 135–145)

## 2015-02-11 MED ORDER — ONDANSETRON HCL 4 MG/2ML IJ SOLN
INTRAMUSCULAR | Status: AC
  Administered 2015-02-11: 4 mg via INTRAVENOUS
  Filled 2015-02-11: qty 2

## 2015-02-11 MED ORDER — ONDANSETRON 4 MG PO TBDP: 4.0000 mg | ORAL_TABLET | Freq: Three times a day (TID) | ORAL | Status: DC | PRN

## 2015-02-11 MED ORDER — SODIUM CHLORIDE 0.9 % IV BOLUS (SEPSIS)
1000.0000 mL | Freq: Once | INTRAVENOUS | Status: AC
  Administered 2015-02-11: 1000 mL via INTRAVENOUS

## 2015-02-11 MED ORDER — ONDANSETRON HCL 4 MG/2ML IJ SOLN
4.0000 mg | Freq: Once | INTRAMUSCULAR | Status: AC
  Administered 2015-02-11: 4 mg via INTRAVENOUS

## 2015-02-11 NOTE — ED Notes (Signed)
States has been vomiting intermittently throughout pregnancy.  States vomiting returned about one week ago.  Feeling lightheaded today.

## 2015-02-11 NOTE — ED Provider Notes (Signed)
[facility] Emergency Department Provider Note  Time seen: 5:36 PM  I have reviewed the triage vital signs and the nursing notes.   HISTORY  Chief Complaint Emesis During Pregnancy    HPI Brenda Wang is a 24 y.o. female G2 P1 [redacted] weeks pregnant presents the emergency department nausea or vomiting. According to the patient throughout her entire pregnancy she has had intermittent nausea and vomiting. For the past 1 week and is worsen, patient states she is vomiting approximately 5-6 times per day, taking in very little due to the nausea, today began feeling lightheaded. She called the OB referred her to the emergency department for evaluation. Patient denies any abdominal pain, cramping, contractions, vaginal bleeding, discharge or fluid leakage. Patient states for her first pregnancy she had significant nausea as well requiring Zofran and Phenergan at home. Has not used any antiemetics during this pregnancy.Describes her nausea as moderate.     History reviewed. No pertinent past medical history.  Patient Active Problem List   Diagnosis Date Noted  . Pseudomonas folliculitis [id number]  . Anemia, iron deficiency 01/29/2013  . B12 deficiency 01/29/2013  . Contraceptive management 01/29/2013  . Menstrual irregularity 01/28/2013    Past Surgical History  Procedure Laterality Date  . Therapeutic abortion    . Cesarean section  01/23/2014    Current Outpatient Rx  Name  Route  Sig  Dispense  Refill  . Prenatal Multivit-Min-Fe-FA (PRENATAL VITAMINS PO)   Oral   Take 1 tablet by mouth daily.         . ciprofloxacin (CIPRO) 250 MG tablet   Oral   Take 1 tablet (250 mg total) by mouth 2 (two) times daily.   10 tablet   0   . Cyanocobalamin 1000 MCG SUBL   Sublingual   Place 1 tablet (1,000 mcg total) under the tongue daily. Patient not taking: Reported on 09/28/2014   90 tablet   3   . norethindrone-ethinyl estradiol (JUNEL FE,GILDESS  FE,LOESTRIN FE) 1-20 MG-MCG tablet   Oral   Take 1 tablet by mouth daily.         Marland Kitchen triamcinolone cream (KENALOG) 0.1 %   Topical   Apply 1 application topically 2 (two) times daily.   30 g   0     Allergies Sulfa antibiotics  Family History  Problem Relation Age of Onset  . Hypertension Paternal Grandmother   . Diabetes Paternal Grandmother   . Cancer Paternal Grandfather     colon ca    Social History Social History  Substance Use Topics  . Smoking status: Never Smoker   . Smokeless tobacco: Never Used  . Alcohol Use: 2.4 oz/week    4 Glasses of wine per week    Review of Systems Constitutional: Negative for fever. Cardiovascular: Negative for chest pain. Respiratory: Negative for shortness of breath. Gastrointestinal: Negative for abdominal pain. Positive for nausea and vomiting. Negative for diarrhea or constipation. Genitourinary: Negative for dysuria. Neurological: Negative for headache 10-point ROS otherwise negative.  ____________________________________________   PHYSICAL EXAM:  VITAL SIGNS: ED Triage Vitals  Enc Vitals Group     BP 02/11/15 1706 121/75 mmHg     Pulse Rate 02/11/15 1706 97     Resp 02/11/15 1706 18     Temp 02/11/15 1706 98.3 F (36.8 C)     Temp Source 02/11/15 1706 Oral     SpO2 02/11/15 1706 99 %     Weight 02/11/15 1706 180 lb (81.647 kg)  Height 02/11/15 1706 5\' 4"  (1.626 m)     Head Cir --      Peak Flow --      Pain Score 02/11/15 1707 0     Pain Loc --      Pain Edu? --      Excl. in [place]? --     Constitutional: Alert and oriented. Well appearing and in no distress. Eyes: Normal exam ENT   Head: Normocephalic and atraumatic   Mouth/Throat: Dry mucous membranes. Cardiovascular: Normal rate, regular rhythm. No murmur Respiratory: Normal respiratory effort without tachypnea nor retractions. Breath sounds are clear Gastrointestinal: Soft and nontender. No rebound or guarding. Musculoskeletal: Nontender  with normal range of motion in all extremities.  Neurologic:  Normal speech and language. No gross focal neurologic deficits Skin:  Skin is warm, dry and intact.  Psychiatric: Mood and affect are normal. Speech and behavior are normal.   ____________________________________________     INITIAL IMPRESSION / ASSESSMENT AND PLAN / ED COURSE  Pertinent labs & imaging results that were available during my care of the patient were reviewed by me and considered in my medical decision making (see chart for details).  She presents the emergency department with nausea and vomiting. Patient states this has been happening throughout her entire pregnancy but is much worse over the past one week. Knisely abdominal pain or other concerning symptoms. We will check basic labs, IV hydrate. I discussed with the patient and her mother the pros and cons of using Zofran including cleft lip, cleft palate, and they would still prefer to proceed with that medication, which I believe is a reasonable choice. We will dose Zofran, IV fluids while awaiting lab results.  Patient is feeling much better. Labs show no acute abnormalities. We'll discharge the patient home with Zofran and OB follow-up. Patient agreeable plan. ____________________________________________   FINAL CLINICAL IMPRESSION(S) / ED DIAGNOSES  Nausea and vomiting of pregnancy   Harvest Dark, MD 02/11/15 1851

## 2015-02-11 NOTE — ED Notes (Signed)
AAOx3.  Skin warm and dry. Ambulates with easy and steady gait. 

## 2015-02-11 NOTE — Discharge Instructions (Signed)
Hyperemesis Gravidarum  Hyperemesis gravidarum is a severe form of nausea and vomiting that happens during pregnancy. Hyperemesis is worse than morning sickness. It may cause you to have nausea or vomiting all day for many days. It may keep you from eating and drinking enough food and liquids. Hyperemesis usually occurs during the first half (the first 20 weeks) of pregnancy. It often goes away once a woman is in her second half of pregnancy. However, sometimes hyperemesis continues through an entire pregnancy.   CAUSES   The cause of this condition is not completely known but is thought to be related to changes in the body's hormones when pregnant. It could be from the high level of the pregnancy hormone or an increase in estrogen in the body.   SIGNS AND SYMPTOMS    Severe nausea and vomiting.   Nausea that does not go away.   Vomiting that does not allow you to keep any food down.   Weight loss and body fluid loss (dehydration).   Having no desire to eat or not liking food you have previously enjoyed.  DIAGNOSIS   Your health care provider will do a physical exam and ask you about your symptoms. He or she may also order blood tests and urine tests to make sure something else is not causing the problem.   TREATMENT   You may only need medicine to control the problem. If medicines do not control the nausea and vomiting, you will be treated in the hospital to prevent dehydration, increased acid in the blood (acidosis), weight loss, and changes in the electrolytes in your body that may harm the unborn baby (fetus). You may need IV fluids.   HOME CARE INSTRUCTIONS    Only take over-the-counter or prescription medicines as directed by your health care provider.   Try eating a couple of dry crackers or toast in the morning before getting out of bed.   Avoid foods and smells that upset your stomach.   Avoid fatty and spicy foods.   Eat 5-6 small meals a day.   Do not drink when eating meals. Drink between  meals.   For snacks, eat high-protein foods, such as cheese.   Eat or suck on things that have ginger in them. Ginger helps nausea.   Avoid food preparation. The smell of food can spoil your appetite.   Avoid iron pills and iron in your multivitamins until after 3-4 months of being pregnant. However, consult with your health care provider before stopping any prescribed iron pills.  SEEK MEDICAL CARE IF:    Your abdominal pain increases.   You have a severe headache.   You have vision problems.   You are losing weight.  SEEK IMMEDIATE MEDICAL CARE IF:    You are unable to keep fluids down.   You vomit blood.   You have constant nausea and vomiting.   You have excessive weakness.   You have extreme thirst.   You have dizziness or fainting.   You have a fever or persistent symptoms for more than 2-3 days.   You have a fever and your symptoms suddenly get worse.  MAKE SURE YOU:    Understand these instructions.   Will watch your condition.   Will get help right away if you are not doing well or get worse.     This information is not intended to replace advice given to you by your health care provider. Make sure you discuss any questions you have with 

## 2015-05-18 LAB — OB RESULTS CONSOLE RPR: RPR: NONREACTIVE

## 2015-05-18 LAB — OB RESULTS CONSOLE HIV ANTIBODY (ROUTINE TESTING): HIV: NONREACTIVE

## 2015-07-25 ENCOUNTER — Observation Stay
Admission: EM | Admit: 2015-07-25 | Discharge: 2015-07-25 | Disposition: A | Discharge status: Home / Self Care | Payer: Medicaid Other | Attending: Certified Nurse Midwife | Admitting: Certified Nurse Midwife

## 2015-07-25 DIAGNOSIS — R112 Nausea with vomiting, unspecified: Secondary | ICD-10-CM | POA: Insufficient documentation

## 2015-07-25 DIAGNOSIS — Z3A37 37 weeks gestation of pregnancy: Secondary | ICD-10-CM | POA: Diagnosis not present

## 2015-07-25 DIAGNOSIS — O471 False labor at or after 37 completed weeks of gestation: Secondary | ICD-10-CM | POA: Diagnosis present

## 2015-07-25 DIAGNOSIS — E860 Dehydration: Secondary | ICD-10-CM | POA: Insufficient documentation

## 2015-07-25 DIAGNOSIS — O9989 Other specified diseases and conditions complicating pregnancy, childbirth and the puerperium: Secondary | ICD-10-CM | POA: Insufficient documentation

## 2015-07-25 DIAGNOSIS — O34219 Maternal care for unspecified type scar from previous cesarean delivery: Secondary | ICD-10-CM | POA: Diagnosis not present

## 2015-07-25 HISTORY — DX: History of uterine scar from previous surgery: Z98.891

## 2015-07-25 HISTORY — DX: Anemia, unspecified: D64.9

## 2015-07-25 LAB — URINALYSIS COMPLETE WITH MICROSCOPIC (ARMC ONLY)
BILIRUBIN URINE: NEGATIVE
Glucose, UA: NEGATIVE mg/dL
Hgb urine dipstick: NEGATIVE
Nitrite: NEGATIVE
PH: 7 (ref 5.0–8.0)
Protein, ur: NEGATIVE mg/dL
SPECIFIC GRAVITY, URINE: 1.018 (ref 1.005–1.030)

## 2015-07-25 MED ORDER — ONDANSETRON 4 MG PO TBDP
4.0000 mg | ORAL_TABLET | Freq: Once | ORAL | Status: AC
  Administered 2015-07-25: 4 mg via ORAL
  Filled 2015-07-25: qty 1

## 2015-07-25 NOTE — Final Progress Note (Signed)
Physician Final Progress Note  Patient ID: Brenda Wang MRN: [id number] DOB/AGE: [birth year] 25 y.o.  Admit date: 07/25/2015 Admitting provider: Honor Loh Ward, MD Discharge date: 07/25/2015   Admission Diagnoses: IUP at 37 weeks with contractions Previous Cesarean section  Discharge Diagnoses: as above Dehydration Nausea and vomiting Not in labor Consults: none  Significant Findings/ Diagnostic Studies: 25 year old G3 P1011 with EDC=08/15/2015 presented at 37 weeks with c/o contractions this evening since spending much of the day outdoors. She had not drunk very much while outdoors so went home first and drank and ate supper. Her contractions fizzled out upon arrival to L&D; only mild uterine irritability seen. Her cervix was closed/50-60%/ -2. Shortly after arrival vomited up supper. She was treated with Zofran ODT and was feeling better-able to keep down crackers and some fluids before being discharged. FHR was reactive with a baseline 125-130 and accelerations to 140s to 190s. Her prenatal care at [facility] is remarkable for a LTCS for FITL and a repeat CS has been scheduled. Results for orders placed or performed during the hospital encounter of 07/25/15 (from the past 24 hour(s))  Urinalysis complete, with microscopic ([facility] only)     Status: Abnormal   Collection Time: 07/25/15  8:30 PM  Result Value Ref Range   Color, Urine YELLOW (A) YELLOW   APPearance CLEAR (A) CLEAR   Glucose, UA NEGATIVE NEGATIVE mg/dL   Bilirubin Urine NEGATIVE NEGATIVE   Ketones, ur TRACE (A) NEGATIVE mg/dL   Specific Gravity, Urine 1.018 1.005 - 1.030   Hgb urine dipstick NEGATIVE NEGATIVE   pH 7.0 5.0 - 8.0   Protein, ur NEGATIVE NEGATIVE mg/dL   Nitrite NEGATIVE NEGATIVE   Leukocytes, UA 3+ (A) NEGATIVE   RBC / HPF 0-5 0 - 5 RBC/hpf   WBC, UA 0-5 0 - 5 WBC/hpf   Bacteria, UA RARE (A) NONE SEEN   Squamous Epithelial / LPF 6-30 (A) NONE SEEN   Mucous PRESENT     Procedures: NONe  Discharge  Condition: stable  Disposition: 01-Home or Self Care  Diet: Regular diet  Discharge Activity: Activity as tolerated      Total time spent taking care of this patient: 15 minutes  Signed: Dalia Heading 07/25/2015, 8:46 PM

## 2015-07-25 NOTE — OB Triage Note (Signed)
May take otc Tylenol as instructed on label for mild abdominal discomfort, return to hospital if having vaginal bleeding like a period,continued leaking or gush of fluid, decreased or absent movement of baby and regular painful contractions for more than an hour.

## 2015-07-25 NOTE — Progress Notes (Signed)
Lab associate notified urine culture being added to urine specimen sent earlier.

## 2015-07-25 NOTE — OB Triage Note (Signed)
UA results reviewed with pt. FHT remains reactive, occasional, irregular contractions noted. Cervix closed on initial exam. Discharge teaching with labor precautions reviewed with pt, s/o and family present. Encouraged pt to drink plenty fluids throughout the day and night to stay well hydrated. Avoid strenuous activity, rest, warm shower to ease discomfort, lying on left side and pelvic rest until seen and evaluated at next OB visit as scheduled tomorrow.

## 2015-07-25 NOTE — OB Triage Note (Signed)
Pt arrived to [facility] triage in no acute distress, reports painful contractions that started this afternoon about 1715p after being outdoors at park for about 1.5 hours, denies drinking any fluids while outdoors, says she drank a bottle of water on her way home and was still feeling painful contractions but since arriving to triage she had not felt contractions as often or painful as she was earlier. Last seen in clinic last Monday and cervix checked, closed. GBS swab collected, results unknown. Pt confirms active fetal movement, denies spotting, leaking or gush of fluid, urinary sym or unusual vaginal discharge. Reports she was treated for uti 2 mos ago and nausea w/o vomiting yesterday, just not feeling okay since yesterday, was busy helping prepare meal for her mom's 50th birthday. Pt has hx previous c-section d/t NRFHT and failure to progress, plan for repeat c/s, scheduled for 6/27, next OB appt tomorrow.

## 2015-07-25 NOTE — Progress Notes (Signed)
Pt given clicker button at [id number], test performed, pt instructed to press button on clicker whenever she felt painful contractions. RN back in room at 2120 to reassess, pt states she has not felt any painful contraction like she was earlier and for that reason she has not pressed clicker button yet. Pt mentions she has been going to the bathroom more often than usual since today.

## 2015-07-28 LAB — URINE CULTURE

## 2015-08-04 ENCOUNTER — Observation Stay
Admission: EM | Admit: 2015-08-04 | Discharge: 2015-08-04 | Disposition: A | Discharge status: Home / Self Care | Payer: Medicaid Other | Attending: Obstetrics & Gynecology | Admitting: Obstetrics & Gynecology

## 2015-08-04 DIAGNOSIS — O34219 Maternal care for unspecified type scar from previous cesarean delivery: Secondary | ICD-10-CM | POA: Diagnosis not present

## 2015-08-04 DIAGNOSIS — O26893 Other specified pregnancy related conditions, third trimester: Principal | ICD-10-CM | POA: Insufficient documentation

## 2015-08-04 DIAGNOSIS — Z3A38 38 weeks gestation of pregnancy: Secondary | ICD-10-CM | POA: Diagnosis not present

## 2015-08-04 DIAGNOSIS — N898 Other specified noninflammatory disorders of vagina: Secondary | ICD-10-CM | POA: Insufficient documentation

## 2015-08-04 NOTE — OB Triage Note (Signed)
Pt presents to L&D with vaginal leaking= pt reports leaking since 1000am this morning. Pt run of contractions about 6 minutes apart which stopped around 1130. Then more contractions started from 1400-1600 with contractions lasting 6 minutes. No contractions since then. Pt reports changing undergarments multiple times today with clothing "soaked".

## 2015-08-04 NOTE — Discharge Summary (Signed)
Physician Discharge Summary  Patient ID: Brenda Wang MRN: [id number] DOB/AGE: Mar 10, 1990 25 y.o.  Admit date: 08/04/2015 Discharge date: 08/04/2015  Admission Diagnoses: 25 year old with [redacted]w[redacted]d IUP with complaint of vaginal fluid discharge  Discharge Diagnoses:  Active Problems:   Labor and delivery indication for care or intervention IUP with reactive NST, not in labor, scheduled for repeat cesarean section Tuesday June 27  Discharged Condition: good  Hospital Course: admitted for observation, placed on monitors, tests for Rupture of Membranes  Consults: None  Significant Diagnostic Studies: none  Treatments: none  Discharge Exam: Blood pressure 116/64, pulse 86, temperature 98.1 F (36.7 C), temperature source Oral, resp. rate 18, height 5\' 4"  (1.626 m), weight 86.183 kg (190 lb), last menstrual period 11/08/2014, SpO2 98 %. General appearance: alert, cooperative, appears stated age and no distress  Cervix: FT/80%/Mid Position on admission per RN exam Cervix: Speculum exam: FT/20%/-3 per CNM visual exam Nitrazine: negative, Pooling: negative, Ferning: negative, White discharge present Toco: negative Fetal Well Being: 135 bpm baseline, moderate variability, + accelerations, - decelerations   Disposition: Home      Discharge Instructions    Discharge activity:  No Restrictions    Complete by:  As directed      Discharge diet:  No restrictions    Complete by:  As directed      Fetal Kick Count:  Lie on our left side for one hour after a meal, and count the number of times your baby kicks.  If it is less than 5 times, get up, move around and drink some juice.  Repeat the test 30 minutes later.  If it is still less than 5 kicks in an hour, notify your doctor.    Complete by:  As directed      LABOR:  When conractions begin, you should start to time them from the beginning of one contraction to the beginning  of the next.  When contractions are 5 - 10 minutes apart or  less and have been regular for at least an hour, you should call your health care provider.    Complete by:  As directed      No sexual activity restrictions    Complete by:  As directed      Notify physician for bleeding from the vagina    Complete by:  As directed      Notify physician for blurring of vision or spots before the eyes    Complete by:  As directed      Notify physician for chills or fever    Complete by:  As directed      Notify physician for fainting spells, "black outs" or loss of consciousness    Complete by:  As directed      Notify physician for increase in vaginal discharge    Complete by:  As directed      Notify physician for leaking of fluid    Complete by:  As directed      Notify physician for pain or burning when urinating    Complete by:  As directed      Notify physician for pelvic pressure (sudden increase)    Complete by:  As directed      Notify physician for severe or continued nausea or vomiting    Complete by:  As directed      Notify physician for sudden gushing of fluid from the vagina (with or without continued leaking)    Complete by:  As directed      Notify physician for sudden, constant, or occasional abdominal pain    Complete by:  As directed      Notify physician if baby moving less than usual    Complete by:  As directed             Medication List    STOP taking these medications        ciprofloxacin 250 MG tablet  Commonly known as:  CIPRO     Cyanocobalamin 1000 MCG Subl     ondansetron 4 MG disintegrating tablet  Commonly known as:  ZOFRAN ODT     triamcinolone cream 0.1 %  Commonly known as:  KENALOG      TAKE these medications        PRENATAL VITAMINS PO  Take 1 tablet by mouth daily.       Follow-up Information    Go to to follow up.   Why:  regular scheduled prenatal appointment      Signed: Rod Can, CNM

## 2015-08-09 ENCOUNTER — Encounter
Admission: RE | Admit: 2015-08-09 | Discharge: 2015-08-09 | Disposition: A | Discharge status: Home / Self Care | Payer: Medicaid Other | Source: Ambulatory Visit | Attending: Obstetrics and Gynecology | Admitting: Obstetrics and Gynecology

## 2015-08-09 HISTORY — DX: Unspecified convulsions: R56.9

## 2015-08-09 LAB — CBC
HEMATOCRIT: 32.8 % — AB (ref 35.0–47.0)
HEMOGLOBIN: 11.4 g/dL — AB (ref 12.0–16.0)
MCH: 27.5 pg (ref 26.0–34.0)
MCHC: 34.8 g/dL (ref 32.0–36.0)
MCV: 79.1 fL — AB (ref 80.0–100.0)
PLATELETS: 162 10*3/uL (ref 150–440)
RBC: 4.15 MIL/uL (ref 3.80–5.20)
RDW: 14.4 % (ref 11.5–14.5)
WBC: 7.3 10*3/uL (ref 3.6–11.0)

## 2015-08-09 LAB — TYPE AND SCREEN
ABO/RH(D): O POS
Antibody Screen: NEGATIVE
EXTEND SAMPLE REASON: UNDETERMINED

## 2015-08-09 LAB — OB RESULTS CONSOLE HIV ANTIBODY (ROUTINE TESTING): HIV: NONREACTIVE

## 2015-08-09 LAB — OB RESULTS CONSOLE RPR: RPR: NONREACTIVE

## 2015-08-09 NOTE — Patient Instructions (Signed)
  Your procedure is scheduled on: 08/10/15 5:30 AM Report to [place]. Marland Kitchen  Remember: Instructions that are not followed completely may result in serious medical risk, up to and including death, or upon the discretion of your surgeon and anesthesiologist your surgery may need to be rescheduled.    _X___ 1. Do not eat food or drink liquids after midnight. No gum chewing or hard candies.     _X___ 2. No Alcohol for 24 hours before or after surgery.   _X___ 3. Do Not Smoke For 24 Hours Prior to Your Surgery.   ____ 4. Bring all medications with you on the day of surgery if instructed.    _X___ 5. Notify your doctor if there is any change in your medical condition     (cold, fever, infections).       Do not wear jewelry, make-up, hairpins, clips or nail polish.  Do not wear lotions, powders, or perfumes. You may wear deodorant.  Do not shave 48 hours prior to surgery. Men may shave face and neck.  Do not bring valuables to the hospital.    [facility] is not responsible for any belongings or valuables.               Contacts, dentures or bridgework may not be worn into surgery.  Leave your suitcase in the car. After surgery it may be brought to your room.  For patients admitted to the hospital, discharge time is determined by your                treatment team.   Patients discharged the day of surgery will not be allowed to drive home.   Please read over the following fact sheets that you were given:   Surgical Site Infection Prevention   ____ Take these medicines the morning of surgery with A SIP OF WATER:    1. NONE  2.   3.   4.  5.  6.  ____ Fleet Enema (as directed)   _X___ Use CHG Soap as directed  ____ Use inhalers on the day of surgery  ____ Stop metformin 2 days prior to surgery    ____ Take 1/2 of usual insulin dose the night before surgery and none on the morning of surgery.   ____ Stop Coumadin/Plavix/aspirin on  ____ Stop Anti-inflammatories  on   ____ Stop supplements until after surgery.    ____ Bring C-Pap to the hospital.

## 2015-08-10 ENCOUNTER — Inpatient Hospital Stay: Payer: Medicaid Other | Admitting: Certified Registered Nurse Anesthetist

## 2015-08-10 ENCOUNTER — Encounter
Admission: RE | Disposition: A | Discharge status: Home / Self Care | Payer: Self-pay | Source: Ambulatory Visit | Attending: Obstetrics and Gynecology

## 2015-08-10 ENCOUNTER — Inpatient Hospital Stay
Admission: RE | Admit: 2015-08-10 | Discharge: 2015-08-13 | DRG: 765 | Disposition: A | Discharge status: Home / Self Care | Payer: Medicaid Other | Source: Ambulatory Visit | Attending: Obstetrics and Gynecology | Admitting: Obstetrics and Gynecology

## 2015-08-10 DIAGNOSIS — Z3A39 39 weeks gestation of pregnancy: Secondary | ICD-10-CM | POA: Diagnosis not present

## 2015-08-10 DIAGNOSIS — D62 Acute posthemorrhagic anemia: Secondary | ICD-10-CM | POA: Diagnosis not present

## 2015-08-10 DIAGNOSIS — O34211 Maternal care for low transverse scar from previous cesarean delivery: Principal | ICD-10-CM | POA: Diagnosis present

## 2015-08-10 DIAGNOSIS — O9081 Anemia of the puerperium: Secondary | ICD-10-CM | POA: Diagnosis not present

## 2015-08-10 DIAGNOSIS — Z98891 History of uterine scar from previous surgery: Secondary | ICD-10-CM

## 2015-08-10 LAB — RPR: RPR Ser Ql: NONREACTIVE

## 2015-08-10 LAB — HIV ANTIBODY (ROUTINE TESTING W REFLEX): HIV SCREEN 4TH GENERATION: NONREACTIVE

## 2015-08-10 SURGERY — Surgical Case
Anesthesia: Spinal

## 2015-08-10 MED ORDER — OXYTOCIN 40 UNITS IN LACTATED RINGERS INFUSION - SIMPLE MED
2.5000 [IU]/h | INTRAVENOUS | Status: DC
  Filled 2015-08-10: qty 1000

## 2015-08-10 MED ORDER — MORPHINE SULFATE (PF) 2 MG/ML IV SOLN: 1.0000 mg | INTRAVENOUS | Status: AC | PRN

## 2015-08-10 MED ORDER — SIMETHICONE 80 MG PO CHEW: 80.0000 mg | CHEWABLE_TABLET | ORAL | Status: DC

## 2015-08-10 MED ORDER — NALBUPHINE HCL 10 MG/ML IJ SOLN: 5.0000 mg | Freq: Once | INTRAMUSCULAR | Status: DC | PRN

## 2015-08-10 MED ORDER — POTASSIUM CITRATE-CITRIC ACID 1100-334 MG/5ML PO SOLN: 10.0000 meq | Freq: Three times a day (TID) | ORAL | Status: DC

## 2015-08-10 MED ORDER — ACETAMINOPHEN 325 MG PO TABS: 650.0000 mg | ORAL_TABLET | ORAL | Status: DC | PRN

## 2015-08-10 MED ORDER — OXYTOCIN BOLUS FROM INFUSION: 500.0000 mL | INTRAVENOUS | Status: DC

## 2015-08-10 MED ORDER — DIBUCAINE 1 % RE OINT: 1.0000 "application " | TOPICAL_OINTMENT | RECTAL | Status: DC | PRN

## 2015-08-10 MED ORDER — NALOXONE HCL 2 MG/2ML IJ SOSY
1.0000 ug/kg/h | PREFILLED_SYRINGE | INTRAMUSCULAR | Status: DC | PRN
  Filled 2015-08-10: qty 2

## 2015-08-10 MED ORDER — IBUPROFEN 600 MG PO TABS
600.0000 mg | ORAL_TABLET | Freq: Four times a day (QID) | ORAL | Status: DC
  Administered 2015-08-11 – 2015-08-13 (×7): 600 mg via ORAL
  Filled 2015-08-10 (×2): qty 1

## 2015-08-10 MED ORDER — WITCH HAZEL-GLYCERIN EX PADS: 1.0000 "application " | MEDICATED_PAD | CUTANEOUS | Status: DC | PRN

## 2015-08-10 MED ORDER — SOD CITRATE-CITRIC ACID 500-334 MG/5ML PO SOLN
30.0000 mL | Freq: Once | ORAL | Status: AC
  Administered 2015-08-10: 30 mL via ORAL

## 2015-08-10 MED ORDER — ONDANSETRON HCL 4 MG/2ML IJ SOLN
INTRAMUSCULAR | Status: DC | PRN
  Administered 2015-08-10: 4 mg via INTRAVENOUS

## 2015-08-10 MED ORDER — NALOXONE HCL 0.4 MG/ML IJ SOLN: 0.4000 mg | INTRAMUSCULAR | Status: DC | PRN

## 2015-08-10 MED ORDER — OXYCODONE-ACETAMINOPHEN 5-325 MG PO TABS
2.0000 | ORAL_TABLET | ORAL | Status: DC | PRN
  Administered 2015-08-11 – 2015-08-12 (×3): 2 via ORAL
  Filled 2015-08-10 (×3): qty 2

## 2015-08-10 MED ORDER — SIMETHICONE 80 MG PO CHEW
80.0000 mg | CHEWABLE_TABLET | Freq: Three times a day (TID) | ORAL | Status: DC
  Administered 2015-08-11 – 2015-08-13 (×5): 80 mg via ORAL
  Filled 2015-08-10 (×5): qty 1

## 2015-08-10 MED ORDER — OXYCODONE-ACETAMINOPHEN 5-325 MG PO TABS
1.0000 | ORAL_TABLET | ORAL | Status: DC | PRN
  Administered 2015-08-13: 1 via ORAL
  Filled 2015-08-10 (×2): qty 1

## 2015-08-10 MED ORDER — COCONUT OIL OIL
1.0000 "application " | TOPICAL_OIL | Status: DC | PRN
  Administered 2015-08-12: 1 via TOPICAL
  Filled 2015-08-10: qty 120

## 2015-08-10 MED ORDER — SENNOSIDES-DOCUSATE SODIUM 8.6-50 MG PO TABS
2.0000 | ORAL_TABLET | ORAL | Status: DC
  Administered 2015-08-12 – 2015-08-13 (×3): 2 via ORAL
  Filled 2015-08-10 (×4): qty 2

## 2015-08-10 MED ORDER — FENTANYL CITRATE (PF) 100 MCG/2ML IJ SOLN: 25.0000 ug | INTRAMUSCULAR | Status: DC | PRN

## 2015-08-10 MED ORDER — OXYTOCIN 40 UNITS IN LACTATED RINGERS INFUSION - SIMPLE MED
INTRAVENOUS | Status: DC | PRN
  Administered 2015-08-10: 1000 mL via INTRAVENOUS

## 2015-08-10 MED ORDER — NALBUPHINE HCL 10 MG/ML IJ SOLN: 5.0000 mg | INTRAMUSCULAR | Status: DC | PRN

## 2015-08-10 MED ORDER — BUPIVACAINE HCL (PF) 0.5 % IJ SOLN
10.0000 mL | Freq: Once | INTRAMUSCULAR | Status: DC
  Filled 2015-08-10: qty 30

## 2015-08-10 MED ORDER — DIPHENHYDRAMINE HCL 25 MG PO CAPS: 25.0000 mg | ORAL_CAPSULE | Freq: Four times a day (QID) | ORAL | Status: DC | PRN

## 2015-08-10 MED ORDER — SODIUM CHLORIDE 0.9% FLUSH: 3.0000 mL | INTRAVENOUS | Status: DC | PRN

## 2015-08-10 MED ORDER — PHENYLEPHRINE HCL 10 MG/ML IJ SOLN
INTRAMUSCULAR | Status: DC | PRN
  Administered 2015-08-10: 50 ug via INTRAVENOUS
  Administered 2015-08-10 (×3): 100 ug via INTRAVENOUS

## 2015-08-10 MED ORDER — IBUPROFEN 600 MG PO TABS
600.0000 mg | ORAL_TABLET | Freq: Four times a day (QID) | ORAL | Status: DC | PRN
  Filled 2015-08-10 (×5): qty 1

## 2015-08-10 MED ORDER — DIPHENHYDRAMINE HCL 25 MG PO CAPS: 25.0000 mg | ORAL_CAPSULE | ORAL | Status: DC | PRN

## 2015-08-10 MED ORDER — BUPIVACAINE IN DEXTROSE 0.75-8.25 % IT SOLN
INTRATHECAL | Status: DC | PRN
  Administered 2015-08-10: 1.7 mL via INTRATHECAL

## 2015-08-10 MED ORDER — MEPERIDINE HCL 25 MG/ML IJ SOLN: 6.2500 mg | INTRAMUSCULAR | Status: DC | PRN

## 2015-08-10 MED ORDER — LACTATED RINGERS IV SOLN: INTRAVENOUS | Status: DC

## 2015-08-10 MED ORDER — SIMETHICONE 80 MG PO CHEW: 80.0000 mg | CHEWABLE_TABLET | ORAL | Status: DC | PRN

## 2015-08-10 MED ORDER — ONDANSETRON HCL 4 MG/2ML IJ SOLN: 4.0000 mg | Freq: Once | INTRAMUSCULAR | Status: DC | PRN

## 2015-08-10 MED ORDER — SOD CITRATE-CITRIC ACID 500-334 MG/5ML PO SOLN
ORAL | Status: AC
  Administered 2015-08-10: 30 mL via ORAL
  Filled 2015-08-10: qty 15

## 2015-08-10 MED ORDER — ONDANSETRON HCL 4 MG/2ML IJ SOLN
4.0000 mg | Freq: Three times a day (TID) | INTRAMUSCULAR | Status: DC | PRN
  Administered 2015-08-10: 4 mg via INTRAVENOUS
  Filled 2015-08-10: qty 2

## 2015-08-10 MED ORDER — SCOPOLAMINE 1 MG/3DAYS TD PT72
1.0000 | MEDICATED_PATCH | Freq: Once | TRANSDERMAL | Status: DC
  Administered 2015-08-10: 1.5 mg via TRANSDERMAL
  Filled 2015-08-10: qty 1

## 2015-08-10 MED ORDER — BUPIVACAINE 0.25 % ON-Q PUMP DUAL CATH 400 ML
400.0000 mL | INJECTION | Status: DC
  Filled 2015-08-10: qty 400

## 2015-08-10 MED ORDER — DIPHENHYDRAMINE HCL 50 MG/ML IJ SOLN: 12.5000 mg | INTRAMUSCULAR | Status: DC | PRN

## 2015-08-10 MED ORDER — CEFAZOLIN SODIUM-DEXTROSE 2-4 GM/100ML-% IV SOLN
2.0000 g | Freq: Once | INTRAVENOUS | Status: AC
  Administered 2015-08-10: 2 g via INTRAVENOUS
  Filled 2015-08-10: qty 100

## 2015-08-10 MED ORDER — OXYTOCIN 40 UNITS IN LACTATED RINGERS INFUSION - SIMPLE MED
2.5000 [IU]/h | INTRAVENOUS | Status: AC
  Administered 2015-08-10: 2.5 [IU]/h via INTRAVENOUS
  Filled 2015-08-10 (×2): qty 1000

## 2015-08-10 MED ORDER — PRENATAL MULTIVITAMIN CH
1.0000 | ORAL_TABLET | Freq: Every day | ORAL | Status: DC
  Administered 2015-08-11 – 2015-08-13 (×3): 1 via ORAL
  Filled 2015-08-10 (×3): qty 1

## 2015-08-10 MED ORDER — MENTHOL 3 MG MT LOZG
1.0000 | LOZENGE | OROMUCOSAL | Status: DC | PRN
  Filled 2015-08-10: qty 9

## 2015-08-10 MED ORDER — LACTATED RINGERS IV SOLN
INTRAVENOUS | Status: DC
  Administered 2015-08-10 (×2): via INTRAVENOUS

## 2015-08-10 MED ORDER — EPHEDRINE SULFATE 50 MG/ML IJ SOLN
INTRAMUSCULAR | Status: DC | PRN
  Administered 2015-08-10: 10 mg via INTRAVENOUS

## 2015-08-10 MED ORDER — MORPHINE SULFATE (PF) 0.5 MG/ML IJ SOLN
INTRAMUSCULAR | Status: DC | PRN
  Administered 2015-08-10: .2 mg via EPIDURAL

## 2015-08-10 SURGICAL SUPPLY — 26 items
BAG COUNTER SPONGE EZ (MISCELLANEOUS) ×2 IMPLANT
CANISTER SUCT 3000ML (MISCELLANEOUS) ×3 IMPLANT
CATH KIT ON-Q SILVERSOAK 5IN (CATHETERS) ×6 IMPLANT
CHLORAPREP W/TINT 26ML (MISCELLANEOUS) ×6 IMPLANT
CLOSURE WOUND 1/2 X4 (GAUZE/BANDAGES/DRESSINGS) ×1
COUNTER SPONGE BAG EZ (MISCELLANEOUS) ×1
DRSG TELFA 3X8 NADH (GAUZE/BANDAGES/DRESSINGS) ×3 IMPLANT
ELECT CAUTERY BLADE 6.4 (BLADE) IMPLANT
ELECT REM PT RETURN 9FT ADLT (ELECTROSURGICAL) ×3
ELECTRODE REM PT RTRN 9FT ADLT (ELECTROSURGICAL) ×1 IMPLANT
GAUZE SPONGE 4X4 12PLY STRL (GAUZE/BANDAGES/DRESSINGS) ×3 IMPLANT
GLOVE BIO SURGEON STRL SZ7 (GLOVE) ×12 IMPLANT
GLOVE INDICATOR 7.5 STRL GRN (GLOVE) ×12 IMPLANT
GOWN STRL REUS W/ TWL LRG LVL3 (GOWN DISPOSABLE) ×3 IMPLANT
GOWN STRL REUS W/TWL LRG LVL3 (GOWN DISPOSABLE) ×6
LIQUID BAND (GAUZE/BANDAGES/DRESSINGS) ×3 IMPLANT
NS IRRIG 1000ML POUR BTL (IV SOLUTION) ×3 IMPLANT
PACK C SECTION AR (MISCELLANEOUS) ×3 IMPLANT
PAD OB MATERNITY 4.3X12.25 (PERSONAL CARE ITEMS) ×3 IMPLANT
PAD PREP 24X41 OB/GYN DISP (PERSONAL CARE ITEMS) ×3 IMPLANT
STRIP CLOSURE SKIN 1/2X4 (GAUZE/BANDAGES/DRESSINGS) ×2 IMPLANT
SUT MNCRL AB 4-0 PS2 18 (SUTURE) ×3 IMPLANT
SUT PDS AB 1 TP1 96 (SUTURE) ×3 IMPLANT
SUT VIC AB 0 CTX 36 (SUTURE) ×2
SUT VIC AB 0 CTX36XBRD ANBCTRL (SUTURE) ×1 IMPLANT
SUT VIC AB 2-0 CT1 36 (SUTURE) ×3 IMPLANT

## 2015-08-10 NOTE — Anesthesia Procedure Notes (Signed)
Spinal Patient location during procedure: OB Start time: 08/10/2015 8:49 AM End time: 08/10/2015 8:57 AM Staffing Anesthesiologist: Alvin Critchley Resident/CRNA: Parminder Cupples Performed by: resident/CRNA  Preanesthetic Checklist Completed: patient identified, site marked, surgical consent, pre-op evaluation, IV checked, risks and benefits discussed and monitors and equipment checked Spinal Block Patient position: sitting Prep: Betadine Patient monitoring: heart rate, continuous pulse ox and blood pressure Approach: midline Location: L3-4 Injection technique: single-shot Needle Needle type: Whitacre  Needle gauge: 25 G

## 2015-08-10 NOTE — Op Note (Signed)
Preoperative Diagnosis: 1) 25 y.o. [id number] at [redacted]w[redacted]d 2) History of prior low transverse cesarean section  Postoperative Diagnosis: 1) 25 y.o. [id number] at [redacted]w[redacted]d 2) History of prior low transverse cesarean section  Operation Performed: Repeat low transverse C-section via pfannenstiel skin incision  Indiciation: Prior Cesarean  Anesthesia: Spinal  Primary Surgeon: Malachy Mood, MD   Assistant: Barnett Applebaum, MD  Preoperative Antibiotics: 2g ancef  Estimated Blood Loss:549mL  IV Fluids: 957mL  Drains or Tubes: Foley to gravity drainage, ON-Q catheter system  Implants: none  Specimens Removed: none  Complications: none  Intraoperative Findings:  Normal tubes ovaries and uterus.  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  Delivery resulted in the birth of a liveborn female, APGAR (1 MIN): 8   APGAR (5 MINS): 9, weight pending  Patient Condition:stable  Procedure in Detail:  Patient was taken to the operating room were she was administered regional anesthesia.  She was positioned in the supine position, prepped and draped in the  Usual sterile fashion.  Prior to proceeding with the case a time out was performed and the level of anesthetic was checked and noted to be adequate.  Utilizing the scalpel a pfannenstiel skin incision was made 2cm above the pubic symphysis utilizing the patient's pre-existing scar, prior keloid was excised at this time, and the incision was carried down sharply to the the level of the rectus fascia.  The fascia was incised in the midline using the scalpel and then extended using mayo scissors.  The superior border of the rectus fascia was grasped with two Kocher clamps and the underlying rectus muscles were dissected of the fascia using blunt dissection.  The median raphae was incised using Mayo scissors.   The inferior border of the rectus fascia was dissected of the rectus muscles in a  similar fashion.  The midline was identified, the peritoneum was entered bluntly and expanded using manual tractions.  The uterus was noted to be in a none rotated position.  Next the bladder blade was placed retracting the bladder caudally.  A bladder flap was created.  The bladder reflection was grasped with a pickup, and Metzenbaum scissors were then used the undermine the bladder reflection.  The bladder flap was developed using digital dissection.  The bladder blade was replaced retracting the bladder caudally out of the operative field. A low transverse incision was scored on the lower uterine segment.  The hysterotomy was entered bluntly using the operators finger.  The hysterotomy incision was extended using manual traction.  The operators hand was placed within the hysterotomy position noting the fetus to be within the OA  position.  The vertex was grasped, flexed, brought to the incision, and delivered a traumatically using fundal pressure.  The remainder of the body delivered with ease.  The infant was suctioned, cord was clamped and cut after 30 minute delay before handing the infant off to the awaiting neonatologist.  The placenta was delivered using manual extraction.  The uterus was exteriorized, wiped clean of clots and debris using two moist laps.  The hysterotomy was closed using a two layer closure of 0 Vicryl, with the first being a running locked, the second a vertical imbricating.  The uterus was returned to the abdomen.  The peritoneal gutters were wiped clean of clots and debris using two moist laps.  The hysterotomy incision was re-inspected noted to be hemostatic.  The peritoneum was closed using a 2-0  Vicryl in a running fashion.  The rectus muscles were inspected noted to be hemostatic.  The superior border of the rectus fascia was grasped with a Kocher clamp.  The ON-Q trocars were then placed 4cm above the superior border of the incision and tunneled subfascially.  The introducers  were removed and the catheters were threaded through the sleeves after which the sleeves were removed.  The fascia was closed using a looped #1 PDS in a running fashion taking 1cm by 1cm bites.  The subcutaneous tissue was irrigated using warm saline, hemostasis achieved using the bovis.  The subcutaneous dead space was less 3cm and was not closed.  The skin was closed using staples.  Sponge needle and instrument counts were corrects times two.  The patient tolerated the procedure well and was taken to the recovery room in stable condition.

## 2015-08-10 NOTE — H&P (Signed)
Date of Initial H&P: 08/06/2015  History reviewed, patient examined, no change in status, stable for surgery.

## 2015-08-10 NOTE — Transfer of Care (Signed)
Immediate Anesthesia Transfer of Care Note  Patient: Brenda Wang  Procedure(s) Performed: Procedure(s): CESAREAN SECTION (N/A)  Patient Location: [facility]  Anesthesia Type:Spinal  Level of Consciousness: awake and oriented  Airway & Oxygen Therapy: Patient Spontanous Breathing  Post-op Assessment: Post -op Vital signs reviewed and stable  Post vital signs: stable  Last Vitals:  Filed Vitals:   08/10/15 0727 08/10/15 1017  BP: 113/54 109/47  Pulse: 78 69  Temp: 36.8 C 36.4 C  Resp: 18 12    Last Pain:  Filed Vitals:   08/10/15 1017  PainSc: 0-No pain         Complications: No apparent anesthesia complications

## 2015-08-10 NOTE — Anesthesia Preprocedure Evaluation (Addendum)
Anesthesia Evaluation  Patient identified by MRN, date of birth, ID band Patient awake    Reviewed: Allergy & Precautions, NPO status , Patient's Chart, lab work & pertinent test results  Airway Mallampati: II  TM Distance: >3 FB     Dental no notable dental hx.    Pulmonary neg pulmonary ROS,    Pulmonary exam normal        Cardiovascular negative cardio ROS Normal cardiovascular exam     Neuro/Psych Seizures as a child negative psych ROS   GI/Hepatic negative GI ROS, Neg liver ROS,   Endo/Other  negative endocrine ROS  Renal/GU negative Renal ROS  negative genitourinary   Musculoskeletal negative musculoskeletal ROS (+)   Abdominal Normal abdominal exam  (+)   Peds negative pediatric ROS (+)  Hematology  (+) anemia ,   Anesthesia Other Findings Seizures as a child Anemia Vit B12 deficiency Prior C-section  Reproductive/Obstetrics (+) Pregnancy                            Anesthesia Physical Anesthesia Plan  ASA: II  Anesthesia Plan: Spinal   Post-op Pain Management:    Induction: Intravenous  Airway Management Planned: Nasal Cannula  Additional Equipment:   Intra-op Plan:   Post-operative Plan:   Informed Consent: I have reviewed the patients History and Physical, chart, labs and discussed the procedure including the risks, benefits and alternatives for the proposed anesthesia with the patient or authorized representative who has indicated his/her understanding and acceptance.   Dental advisory given  Plan Discussed with: CRNA and Surgeon  Anesthesia Plan Comments: (Will add astromorph for post op pain relief per request of surgeon)        Anesthesia Quick Evaluation

## 2015-08-11 LAB — CBC
HCT: 34.9 % — ABNORMAL LOW (ref 35.0–47.0)
HEMOGLOBIN: 11.4 g/dL — AB (ref 12.0–16.0)
MCH: 26.7 pg (ref 26.0–34.0)
MCHC: 32.7 g/dL (ref 32.0–36.0)
MCV: 81.4 fL (ref 80.0–100.0)
Platelets: 169 10*3/uL (ref 150–440)
RBC: 4.29 MIL/uL (ref 3.80–5.20)
RDW: 14.5 % (ref 11.5–14.5)
WBC: 12.7 10*3/uL — AB (ref 3.6–11.0)

## 2015-08-11 NOTE — Progress Notes (Signed)
Post Op Day 1 Subjective:   Pt seen at approximately 0930 this am and was doing well. Tolerating PO intake, ambulating and voiding without difficulty. Her pain is well controlled with PO meds and OnQ Pump. Her bleeding is minimal.   Objective:  Blood pressure 110/68, pulse 77, temperature 98.3 F (36.8 C), temperature source Oral, resp. rate 20, height 5\' 4"  (1.626 m), weight 86.183 kg (190 lb), last menstrual period 11/08/2014, SpO2 97 %, breastfeeding.  General: NAD Pulmonary: no increased work of breathing Abdomen: non-distended, non-tender, fundus firm at level of umbilicus Incision: C/D/I Extremities: no edema, no erythema, no tenderness  Results for orders placed or performed during the hospital encounter of 08/10/15 (from the past 24 hour(s))  CBC     Status: Abnormal   Collection Time: 08/11/15  5:54 AM  Result Value Ref Range   WBC 12.7 (H) 3.6 - 11.0 K/uL   RBC 4.29 3.80 - 5.20 MIL/uL   Hemoglobin 11.4 (L) 12.0 - 16.0 g/dL   HCT 34.9 (L) 35.0 - 47.0 %   MCV 81.4 80.0 - 100.0 fL   MCH 26.7 26.0 - 34.0 pg   MCHC 32.7 32.0 - 36.0 g/dL   RDW 14.5 11.5 - 14.5 %   Platelets 169 150 - 440 K/uL   @I /O24@   Assessment:   25 y.o. [id number] postoperativeday # 1   Plan:  1) Acute blood loss anemia - hemodynamically stable and asymptomatic - po ferrous sulfate  2) O+, Rubella Immune, Varicella Immune  3) TDAP UTD  4) Breast//Contraception: Considering Patch at 3 months  5) Disposition: home- day of discharge depends on PP progress   Amarianna Abplanalp, CNM

## 2015-08-11 NOTE — Anesthesia Postprocedure Evaluation (Signed)
Anesthesia Post Note  Patient: Brenda Wang  Procedure(s) Performed: Procedure(s) (LRB): CESAREAN SECTION (N/A)  Patient location during evaluation: Mother Baby Anesthesia Type: Spinal Level of consciousness: awake and alert Pain management: satisfactory to patient Vital Signs Assessment: post-procedure vital signs reviewed and stable Respiratory status: spontaneous breathing Cardiovascular status: blood pressure returned to baseline Postop Assessment: no backache, no headache, spinal receding and patient able to bend at knees Anesthetic complications: no    Last Vitals:  Filed Vitals:   08/10/15 2346 08/11/15 0418  BP: 96/41 104/63  Pulse: 84 83  Temp: 37 C 36.8 C  Resp: 18 18    Last Pain:  Filed Vitals:   08/11/15 0418  PainSc: 0-No pain                 Rolla Plate P

## 2015-08-11 NOTE — Anesthesia Post-op Follow-up Note (Signed)
  Anesthesia Pain Follow-up Note  Patient: Brenda Wang  Day #: 1  Date of Follow-up: 08/11/2015 Time: 7:18 AM  Last Vitals:  Filed Vitals:   08/10/15 2346 08/11/15 0418  BP: 96/41 104/63  Pulse: 84 83  Temp: 37 C 36.8 C  Resp: 18 18    Level of Consciousness: alert  Pain: mild   Side Effects:None  Catheter Site Exam:clean     Plan: D/C from anesthesia care  Brantley Fling

## 2015-08-12 NOTE — Progress Notes (Signed)
  Post-operative Day 2  Subjective: no complaints, up ad lib, voiding, tolerating PO and + flatus  Objective: Blood pressure 104/53, pulse 63, temperature 97.9 F (36.6 C), temperature source Oral, resp. rate 18, height 5\' 4"  (1.626 m), weight 190 lb (86.183 kg), last menstrual period 11/08/2014, SpO2 99 %,   Physical Exam:  General: alert and cooperative Lochia: appropriate Uterine Fundus: firm Incision: healing well, no significant drainage DVT Evaluation: No evidence of DVT seen on physical exam. Abdomen: soft, NT   Recent Labs  08/11/15 0554  HGB 11.4*  HCT 34.9*   Pre-op Hct: 32.8 Assessment POD #2, repeat LTCS  Plan: Continue PO care and Advance activity as tolerated  Feeding: breast Contraception: ? Patch later Blood Type: O+ RI/VI TDAP UTD    Brenda Wang, [place] 08/12/2015, 11:46 AM

## 2015-08-12 NOTE — Progress Notes (Signed)
Pt asleep with baby in arms during this hourly round. Safe sleep practices discussed with pt including no sleeping with infant in arms.  Pt refused to let RN place infant in crib.  Pt states "I will wake up and call you if I need to put him in the crib."

## 2015-08-13 MED ORDER — IBUPROFEN 600 MG PO TABS: 600.0000 mg | ORAL_TABLET | Freq: Four times a day (QID) | ORAL | Status: DC | PRN

## 2015-08-13 MED ORDER — OXYCODONE-ACETAMINOPHEN 5-325 MG PO TABS: 1.0000 | ORAL_TABLET | ORAL | Status: DC | PRN

## 2015-08-13 NOTE — Discharge Instructions (Signed)
Cesarean Delivery, Care After Refer to this sheet in the next few weeks. These instructions provide you with information on caring for yourself after your procedure. Your health care provider may also give you specific instructions. Your treatment has been planned according to current medical practices, but problems sometimes occur. Call your health care provider if you have any problems or questions after you go home. HOME CARE INSTRUCTIONS  If you have an On-Q pump, remove it on the 5th day after your surgery, by removing the dressing/bandage and pulling the pump out. Cover the site where the pump strings came out with a band-aid, as needed.  Only take over-the-counter or prescription medications as directed by your health care provider.  Do not drink alcohol, especially if you are breastfeeding or taking medication to relieve pain.  Do not  smoke tobacco.  Continue to use good perineal care. Good perineal care includes:  Wiping your perineum from front to back.  Keeping your perineum clean.  Check your surgical cut (incision) daily for increased redness, drainage, swelling, or separation of skin.  Shower and clean your incision gently with soap and water every day, by letting warm and soapy water run over the incision, and then pat it dry. If your health care provider says it is okay, leave the incision uncovered. Use a bandage (dressing) if the incision is draining fluid or appears irritated. If the adhesive strips across the incision do not fall off within 7 days, carefully peel them off, after a shower.  Hug a pillow when coughing or sneezing until your incision is healed. This helps to relieve pain.  Do not use tampons, douches or have sexual intercourse, for 6 weeks  Wear a well-fitting bra that provides breast support.  Limit wearing support panties or control-top hose.  Drink enough fluids to keep your urine clear or pale yellow.  Eat high-fiber foods such as whole  grain cereals and breads, brown rice, beans, and fresh fruits and vegetables every day. These foods may help prevent or relieve constipation.  Do not drive for 2 weeks. No heavy lifting x 6 weeks. Walking is great exercise, but do not resume any strenuous exercise for 6 weeks.  Try to have someone help you with your household activities and your newborn for at least a few days after you leave the hospital.  Rest as much as possible. Try to rest or take a nap when your newborn is sleeping.  Increase your activities gradually.  Do not lift more than 15lbs until directed by a provider.  Keep all of your scheduled postpartum appointments. It is very important to keep your scheduled follow-up appointments. At these appointments, your health care provider will be checking to make sure that you are healing physically and emotionally. SEEK MEDICAL CARE IF:   You are passing large clots from your vagina. Save any clots to show your health care provider.  You have a foul smelling discharge from your vagina.  You have trouble urinating.  You are urinating frequently.  You have pain when you urinate.  You have a change in your bowel movements.  You have increasing redness, pain, or swelling near your incision.  You have pus draining from your incision.  Your incision is separating.  You have painful, hard, or reddened breasts.  You have a severe headache.  You have blurred vision or see spots.  You feel sad or depressed.  You have thoughts of hurting yourself or your newborn.  You have questions  about your care, the care of your newborn, or medications.  You are dizzy or light-headed.  You have a rash.  You have pain, redness, or swelling at the site of the removed intravenous access (IV) tube.  You have nausea or vomiting.  You stopped breastfeeding and have not had a menstrual period within 12 weeks of stopping.  You are not breastfeeding and have not had a menstrual  period within 12 weeks of delivery.  You have a fever. SEEK IMMEDIATE MEDICAL CARE IF:  You have persistent pain.  You have chest pain.  You have shortness of breath.  You faint.  You have leg pain.  You have stomach pain.  Your vaginal bleeding saturates 2 or more sanitary pads in 1 hour. MAKE SURE YOU:   Understand these instructions.  Will watch your condition.  Will get help right away if you are not doing well or get worse. Document Released: 10/22/2001 Document Revised: 06/16/2013 Document Reviewed: 09/27/2011 [facility] Patient Information 2015 [place], [place]. This information is not intended to replace advice given to you by your health care provider. Make sure you discuss any questions you have with your health care provider.  Call your doctor for increased pain or vaginal bleeding, temperature above 100.4, depression, or concerns.  No strenuous activity or heavy lifting for 6 weeks.  No intercourse, tampons, douching, or enemas for 6 weeks.  No tub baths-showers only.  No driving for 2 weeks or while taking pain medications.  Continue prenatal vitamin and iron.  Keep incision clean and dry.  Call your doctor for incision concerns including redness, swelling, bleeding or drainage, or if begins to come apart.  Increase calories and fluids while breastfeeding.

## 2015-08-13 NOTE — Lactation Note (Signed)
This note was copied from a baby's chart. Lactation Consultation Note  Patient Name: Brenda Wang [id number] Date: 08/13/2015     Maternal Data   Mom states baby is nursing well and milk is starting to come in, will go for wt check at ped. On Monday. Encouraged her to feed frequently, attempt every 2 hrs, over weekend to increase wt gain and encourage production of milk and to prevent engorgement.  Baby has had wt loss of 7%   Feeding    [facility] Score/Interventions                      Lactation Tools Discussed/Used     Consult Status      Ferol Luz 08/13/2015, 11:11 AM

## 2015-08-13 NOTE — Progress Notes (Signed)
Prenatal records indicate that pt received TDaP vaccine on 07/05/15. Reed Breech, RN 08/13/2015 10:04 AM

## 2015-08-13 NOTE — Progress Notes (Signed)
Discharge instructions provided.  Pt and sig other verbalize understanding of all instructions and follow-up care.  Pt discharged to home with infant at 63 on 08/13/15 via wheelchair by RN. Reed Breech, RN 08/13/2015 11:28 AM

## 2015-08-13 NOTE — Discharge Summary (Signed)
Physician Obstetric Discharge Summary  Patient ID: Brenda Wang MRN: [id number] DOB/AGE: 1990-06-02 25 y.o.   Date of Admission: 08/10/2015  Date of Discharge: 08/13/2015  Admitting Diagnosis: Scheduled cesarean section at [redacted]w[redacted]d  Secondary Diagnosis: Previous Cesarean section, desires repeat  Mode of Delivery: repeat cesarean section 08/10/2015  low uterine, transverse, two layer closure     Discharge Diagnosis: Term intrauterine pregnancy delivered   Intrapartum Procedures: Atificial rupture of membranes   Post partum procedures: none  Complications: none   Brief Hospital Course    ELEZABETH YOUMAN is a [id number] who underwent cesarean section on 08/10/2015.  Patient had an uncomplicated surgery; for further details of this surgery, please refer to the operative note.  Patient had an uncomplicated postpartum course.  By time of discharge on POD#3, her pain was controlled on oral pain medications; she had appropriate lochia and was ambulating, voiding without difficulty, tolerating regular diet and passing flatus.   She was deemed stable for discharge to home.    Labs: CBC Latest Ref Rng 08/11/2015 08/09/2015 02/11/2015  WBC 3.6 - 11.0 K/uL 12.7(H) 7.3 8.5  Hemoglobin 12.0 - 16.0 g/dL 11.4(L) 11.4(L) 12.6  Hematocrit 35.0 - 47.0 % 34.9(L) 32.8(L) 37.5  Platelets 150 - 440 K/uL 169 162 209   O POS  Physical exam:  Blood pressure 94/68, pulse 77, temperature 98.3 F (36.8 C), temperature source Oral, resp. rate 20, height 5\' 4"  (1.626 m), weight 86.183 kg (190 lb), last menstrual period 11/08/2014, SpO2 99 %, unknown if currently breastfeeding. General: alert and no distress Heart: RRR without murmur Lungs: CTAB Lochia: appropriate Abdomen: soft, NT, BS active Uterine Fundus: firm Pfan Incision: healing well, no significant drainage (scant bloody discharge), no dehiscence, no significant erythema; ON Q intact Extremities: No evidence of DVT seen on physical exam.    Discharge Instructions: Per After Visit Summary. Activity: Advance as tolerated. Pelvic rest for 6 weeks.  Also refer to Discharge Instructions Diet: Regular Medications:   Medication List    STOP taking these medications        ondansetron 8 MG tablet  Commonly known as:  ZOFRAN      TAKE these medications        ibuprofen 600 MG tablet  Commonly known as:  ADVIL,MOTRIN  Take 1 tablet (600 mg total) by mouth every 6 (six) hours as needed for mild pain, moderate pain or cramping.     oxyCODONE-acetaminophen 5-325 MG tablet  Commonly known as:  PERCOCET/ROXICET  Take 1 tablet by mouth every 4 (four) hours as needed for moderate pain or severe pain (pain scale 7-10).     PRENATAL VITAMINS PO  Take 1 tablet by mouth daily.       Outpatient follow up:  Follow-up Information    Follow up with Dorthula Nettles, MD On 08/16/2015.   Specialty:  Obstetrics and Gynecology   Why:  For wound re-check and staple removal   Contact information:   [address] [place] [address] [phone]      Postpartum contraception: to be discussed at her postpartum visits  Discharged Condition: stable  Discharged to: home   Newborn Data: Disposition:home with mother  Apgars: APGAR (1 MIN): 8   APGAR (5 MINS): 9   APGAR (10 MINS):    Baby Feeding: Breast/ KOBE/ 7#6oz  Dalia Heading, [place] 08/13/2015 9:13 AM

## 2015-08-18 MED ORDER — SODIUM CHLORIDE FLUSH 0.9 % IV SOLN
INTRAVENOUS | Status: AC
  Filled 2015-08-18: qty 30

## 2015-08-26 MED ORDER — CEFAZOLIN SODIUM-DEXTROSE 2-4 GM/100ML-% IV SOLN
INTRAVENOUS | Status: AC
  Filled 2015-08-26: qty 100

## 2015-09-29 ENCOUNTER — Encounter: Payer: Self-pay | Admitting: Emergency Medicine

## 2015-09-29 ENCOUNTER — Emergency Department
Admission: EM | Admit: 2015-09-29 | Discharge: 2015-09-29 | Disposition: A | Discharge status: Home / Self Care | Payer: Medicaid Other | Attending: Emergency Medicine | Admitting: Emergency Medicine

## 2015-09-29 DIAGNOSIS — N390 Urinary tract infection, site not specified: Secondary | ICD-10-CM | POA: Diagnosis not present

## 2015-09-29 DIAGNOSIS — R350 Frequency of micturition: Secondary | ICD-10-CM | POA: Diagnosis present

## 2015-09-29 LAB — URINALYSIS COMPLETE WITH MICROSCOPIC (ARMC ONLY)
BILIRUBIN URINE: NEGATIVE
Bacteria, UA: NONE SEEN
Glucose, UA: NEGATIVE mg/dL
KETONES UR: NEGATIVE mg/dL
NITRITE: NEGATIVE
PH: 8 (ref 5.0–8.0)
PROTEIN: 100 mg/dL — AB
SPECIFIC GRAVITY, URINE: 1.011 (ref 1.005–1.030)

## 2015-09-29 MED ORDER — NITROFURANTOIN MONOHYD MACRO 100 MG PO CAPS: 100.0000 mg | ORAL_CAPSULE | Freq: Two times a day (BID) | ORAL | 0 refills | Status: AC

## 2015-09-29 NOTE — ED Provider Notes (Signed)
[facility] Emergency Department Provider Note    ____________________________________________   I have reviewed the triage vital signs and the nursing notes.   HISTORY  Chief Complaint Urinary Frequency   History limited by: Not Limited   HPI Brenda Wang is a 25 y.o. female who presents to the emergency department today because of concerns for urinary frequency. The patient stated that she started having this symptom after a pelvic exam performed by her ob/gyn. She denies any associated dysuria or bad odor to her urine. No abnormal vaginal discharge. No fevers.   Past Medical History:  Diagnosis Date  . Anemia   . H/O: cesarean section    FTP and NRFHT  . Seizures ([place])    as a child    Patient Active Problem List   Diagnosis Date Noted  . S/P cesarean section 08/10/2015  . Anemia, iron deficiency 01/29/2013  . B12 deficiency 01/29/2013  . Contraceptive management 01/29/2013  . Menstrual irregularity 01/28/2013    Past Surgical History:  Procedure Laterality Date  . CESAREAN SECTION  01/23/2014  . CESAREAN SECTION N/A 08/10/2015   Procedure: CESAREAN SECTION;  Surgeon: Malachy Mood, MD;  Location: [facility] ORS;  Service: Obstetrics;  Laterality: N/A;  . THERAPEUTIC ABORTION    . WISDOM TOOTH EXTRACTION      Prior to Admission medications   Medication Sig Start Date End Date Taking? Authorizing Provider  ibuprofen (ADVIL,MOTRIN) 600 MG tablet Take 1 tablet (600 mg total) by mouth every 6 (six) hours as needed for mild pain, moderate pain or cramping. 08/13/15   Dalia Heading, CNM  nitrofurantoin, macrocrystal-monohydrate, (MACROBID) 100 MG capsule Take 1 capsule (100 mg total) by mouth 2 (two) times daily. 09/29/15 10/04/15  Nance Pear, MD  oxyCODONE-acetaminophen (PERCOCET/ROXICET) 5-325 MG tablet Take 1 tablet by mouth every 4 (four) hours as needed for moderate pain or severe pain (pain scale 7-10). 08/13/15   Dalia Heading, CNM  Prenatal Multivit-Min-Fe-FA (PRENATAL VITAMINS PO) Take 1 tablet by mouth daily.    Historical Provider, MD    Allergies Sulfa antibiotics  Family History  Problem Relation Age of Onset  . Cancer Paternal Grandfather     colon ca  . Hypertension Paternal Grandmother   . Diabetes Paternal Grandmother     Social History Social History  Substance Use Topics  . Smoking status: Never Smoker  . Smokeless tobacco: Never Used  . Alcohol use No    Review of Systems  Constitutional: Negative for fever. Cardiovascular: Negative for chest pain. Respiratory: Negative for shortness of breath. Gastrointestinal: Negative for abdominal pain, vomiting and diarrhea. Neurological: Negative for headaches, focal weakness or numbness.  10-point ROS otherwise negative.  ____________________________________________   PHYSICAL EXAM:  VITAL SIGNS: ED Triage Vitals  Enc Vitals Group     BP 09/29/15 0059 124/70     Pulse Rate 09/29/15 0059 (!) 102     Resp 09/29/15 0059 18     Temp 09/29/15 0059 98.2 F (36.8 C)     Temp Source 09/29/15 0059 Oral     SpO2 09/29/15 0059 99 %     Weight 09/29/15 0100 163 lb (73.9 kg)     Height 09/29/15 0100 5\' 4"  (1.626 m)     Head Circumference --      Peak Flow --      Pain Score 09/29/15 0517 0   Constitutional: Alert and oriented. Well appearing and in no distress. Eyes: Conjunctivae are normal. PERRL. Normal extraocular movements. ENT  Head: Normocephalic and atraumatic.   Nose: No congestion/rhinnorhea.   Mouth/Throat: Mucous membranes are moist.   Neck: No stridor. Hematological/Lymphatic/Immunilogical: No cervical lymphadenopathy. Cardiovascular: Normal rate, regular rhythm.  No murmurs, rubs, or gallops. Respiratory: Normal respiratory effort without tachypnea nor retractions. Breath sounds are clear and equal bilaterally. No wheezes/rales/rhonchi. Gastrointestinal: Soft and nontender. No distention. There is  no CVA tenderness. Genitourinary: Deferred Musculoskeletal: Normal range of motion in all extremities. No joint effusions.  No lower extremity tenderness nor edema. Neurologic:  Normal speech and language. No gross focal neurologic deficits are appreciated.  Skin:  Skin is warm, dry and intact. No rash noted. Psychiatric: Mood and affect are normal. Speech and behavior are normal. Patient exhibits appropriate insight and judgment.  ____________________________________________    LABS (pertinent positives/negatives)  Labs Reviewed  URINALYSIS COMPLETEWITH MICROSCOPIC ([facility] ONLY) - Abnormal; Notable for the following:       Result Value   Color, Urine YELLOW (*)    APPearance CLOUDY (*)    Hgb urine dipstick 3+ (*)    Protein, ur 100 (*)    Leukocytes, UA 3+ (*)    Squamous Epithelial / LPF 0-5 (*)    All other components within normal limits     ____________________________________________   EKG  None  ____________________________________________    RADIOLOGY  None  ____________________________________________   PROCEDURES  Procedures  ____________________________________________   INITIAL IMPRESSION / ASSESSMENT AND PLAN / ED COURSE  Pertinent labs & imaging results that were available during my care of the patient were reviewed by me and considered in my medical decision making (see chart for details).  Patient presented with urinary retention. UA consistent with UTI. Patient is not breastfeeding. Will discharge with antibiotics.   ____________________________________________   FINAL CLINICAL IMPRESSION(S) / ED DIAGNOSES  Final diagnoses:  UTI (lower urinary tract infection)  Urinary frequency     Note: This dictation was prepared with Dragon dictation. Any transcriptional errors that result from this process are unintentional    Nance Pear, MD 09/29/15 [phone]

## 2015-09-29 NOTE — ED Triage Notes (Addendum)
Pt reports having her 6 week postpartum visit yesterday and has since been experiencing urinary urgency/frequency (every 15 min) with no pain; denies abd or back pain.

## 2015-09-29 NOTE — Discharge Instructions (Signed)
Please seek medical attention for any high fevers, chest pain, shortness of breath, change in behavior, persistent vomiting, bloody stool or any other new or concerning symptoms.  

## 2015-10-04 ENCOUNTER — Ambulatory Visit: Payer: Medicaid Other | Admitting: Family Medicine

## 2015-10-04 DIAGNOSIS — Z0289 Encounter for other administrative examinations: Secondary | ICD-10-CM

## 2015-10-05 ENCOUNTER — Encounter: Payer: Self-pay | Admitting: Family Medicine

## 2015-10-05 ENCOUNTER — Ambulatory Visit (INDEPENDENT_AMBULATORY_CARE_PROVIDER_SITE_OTHER): Payer: Medicaid Other | Admitting: Family Medicine

## 2015-10-05 DIAGNOSIS — R51 Headache: Secondary | ICD-10-CM

## 2015-10-05 DIAGNOSIS — R519 Headache, unspecified: Secondary | ICD-10-CM

## 2015-10-05 DIAGNOSIS — R197 Diarrhea, unspecified: Secondary | ICD-10-CM

## 2015-10-05 MED ORDER — BUTALBITAL-APAP-CAFFEINE 50-325-40 MG PO TABS: 1.0000 | ORAL_TABLET | Freq: Four times a day (QID) | ORAL | 0 refills | Status: DC | PRN

## 2015-10-05 MED ORDER — ONDANSETRON 4 MG PO TBDP: 4.0000 mg | ORAL_TABLET | Freq: Three times a day (TID) | ORAL | 0 refills | Status: DC | PRN

## 2015-10-05 NOTE — Progress Notes (Signed)
Subjective:  Patient ID: Brenda Wang, female    DOB: February 18, 1990  Age: 25 y.o. MRN: [id number]  CC: Multiple complaints (see below).  HPI:  25 year old female presents today with numerous complaints.  Patient states that she has not been feeling well since Friday. She has been experiencing left frontal/temporal headache, chills, diarrhea, dizziness, decrease in appetite, nausea. She also endorses ear pain. No known inciting factor. No reported sick contacts. She states her diarrhea has persisted. Her dizziness has gotten better. She was able to be yesterday and today. She still feels somewhat nauseated. Patient states her headache is moderate to severe. No medications or interventions tried.  Social Hx   Social History   Social History  . Marital status: Single    Spouse name: N/A  . Number of children: N/A  . Years of education: N/A   Social History Main Topics  . Smoking status: Never Smoker  . Smokeless tobacco: Never Used  . Alcohol use No  . Drug use: No  . Sexual activity: Yes     Comment: pregnant   Other Topics Concern  . None   Social History Narrative  . None   Review of Systems  Constitutional: Positive for appetite change, chills and fatigue.  Gastrointestinal: Positive for diarrhea and nausea.  Neurological: Positive for dizziness and headaches.   Objective:  BP 131/81 (BP Location: Right Arm, Patient Position: Sitting, Cuff Size: Normal)   Pulse (!) 117   Temp 98.9 F (37.2 C) (Oral)   Wt 163 lb 6 oz (74.1 kg)   SpO2 99%   BMI 28.04 kg/m   BP/Weight 10/05/2015 09/29/2015 [id number]  Systolic BP [id number] 0000000 94  Diastolic BP 81 78 68  Wt. (Lbs) 163.38 163 -  BMI 28.04 27.98 -   Physical Exam  Constitutional: She appears well-developed. No distress.  HENT:  Mouth/Throat: Oropharynx is clear and moist.  Normal TM's bilaterally.  Cardiovascular: Regular rhythm.  Tachycardia present.   Pulmonary/Chest: Effort normal. She has no wheezes. She has  no rales.  Abdominal: Soft. She exhibits no distension. There is no tenderness. There is no rebound and no guarding.  Neurological:  No focal deficits.   Psychiatric: She has a normal mood and affect.  Vitals reviewed.  Lab Results  Component Value Date   WBC 12.7 (H) 08/11/2015   HGB 11.4 (L) 08/11/2015   HCT 34.9 (L) 08/11/2015   PLT 169 08/11/2015   GLUCOSE 96 02/11/2015   CHOL 97 05/19/2013   TRIG 24.0 05/19/2013   HDL 43.00 05/19/2013   LDLCALC 49 05/19/2013   ALT 23 09/28/2014   AST 20 09/28/2014   NA 132 (L) 02/11/2015   K 3.0 (L) 02/11/2015   CL 102 02/11/2015   CREATININE 0.66 02/11/2015   BUN 8 02/11/2015   CO2 23 02/11/2015   TSH 1.86 01/28/2013    Assessment & Plan:   Problem List Items Addressed This Visit    Headache    New problem. Treating with Fioricet.      Relevant Medications   butalbital-acetaminophen-caffeine (FIORICET) 50-325-40 MG tablet   Diarrhea    New problem. Associated nausea and decreased appetite. Giving zofran for nausea. Advised increasing fluid intake.       Other Visit Diagnoses   None.     Meds ordered this encounter  Medications  . butalbital-acetaminophen-caffeine (FIORICET) 50-325-40 MG tablet    Sig: Take 1-2 tablets by mouth every 6 (six) hours as needed for headache.    Dispense:  20 tablet    Refill:  0  . ondansetron (ZOFRAN ODT) 4 MG disintegrating tablet    Sig: Take 1 tablet (4 mg total) by mouth every 8 (eight) hours as needed for nausea or vomiting.    Dispense:  20 tablet    Refill:  0    Follow-up: PRN  [place]

## 2015-10-05 NOTE — Progress Notes (Signed)
Pre visit review using our clinic review tool, if applicable. No additional management support is needed unless otherwise documented below in the visit note. 

## 2015-10-05 NOTE — Assessment & Plan Note (Signed)
New problem. Treating with Fioricet.

## 2015-10-05 NOTE — Assessment & Plan Note (Signed)
New problem. Associated nausea and decreased appetite. Giving zofran for nausea. Advised increasing fluid intake.

## 2015-10-05 NOTE — Patient Instructions (Signed)
You are slightly dehydrated from the diarrhea.  Take the zofran for nausea and increase your fluid intake.  Use the fioricet for your headache.  Follow up closely with Dr. Derrel Nip  Take care  Dr. Lacinda Axon

## 2016-04-19 ENCOUNTER — Ambulatory Visit (INDEPENDENT_AMBULATORY_CARE_PROVIDER_SITE_OTHER): Payer: BLUE CROSS/BLUE SHIELD | Admitting: Advanced Practice Midwife

## 2016-04-19 VITALS — Wt 182.0 lb

## 2016-04-19 DIAGNOSIS — Z3A16 16 weeks gestation of pregnancy: Secondary | ICD-10-CM

## 2016-04-19 NOTE — Progress Notes (Signed)
Doing well. No questions or complaints. Return in 4 weeks for anatomy scan.

## 2016-04-19 NOTE — Addendum Note (Signed)
Addended by: [place], Di Jasmer B on: 04/19/2016 10:13 AM   Modules accepted: Orders

## 2016-04-26 ENCOUNTER — Emergency Department: Payer: BLUE CROSS/BLUE SHIELD

## 2016-04-26 ENCOUNTER — Emergency Department
Admission: EM | Admit: 2016-04-26 | Discharge: 2016-04-26 | Disposition: A | Discharge status: Home / Self Care | Payer: BLUE CROSS/BLUE SHIELD | Attending: Emergency Medicine | Admitting: Emergency Medicine

## 2016-04-26 DIAGNOSIS — Z3A17 17 weeks gestation of pregnancy: Secondary | ICD-10-CM | POA: Insufficient documentation

## 2016-04-26 DIAGNOSIS — R1030 Lower abdominal pain, unspecified: Secondary | ICD-10-CM | POA: Diagnosis not present

## 2016-04-26 DIAGNOSIS — K8020 Calculus of gallbladder without cholecystitis without obstruction: Secondary | ICD-10-CM | POA: Insufficient documentation

## 2016-04-26 DIAGNOSIS — R109 Unspecified abdominal pain: Secondary | ICD-10-CM

## 2016-04-26 DIAGNOSIS — O99612 Diseases of the digestive system complicating pregnancy, second trimester: Secondary | ICD-10-CM | POA: Diagnosis present

## 2016-04-26 DIAGNOSIS — R112 Nausea with vomiting, unspecified: Secondary | ICD-10-CM

## 2016-04-26 LAB — COMPREHENSIVE METABOLIC PANEL
ALBUMIN: 3.4 g/dL — AB (ref 3.5–5.0)
ALK PHOS: 60 U/L (ref 38–126)
ALT: 17 U/L (ref 14–54)
AST: 22 U/L (ref 15–41)
Anion gap: 7 (ref 5–15)
BILIRUBIN TOTAL: 0.5 mg/dL (ref 0.3–1.2)
BUN: 12 mg/dL (ref 6–20)
CO2: 24 mmol/L (ref 22–32)
CREATININE: 0.68 mg/dL (ref 0.44–1.00)
Calcium: 8.2 mg/dL — ABNORMAL LOW (ref 8.9–10.3)
Chloride: 104 mmol/L (ref 101–111)
GFR calc Af Amer: >60 mL/min (ref >60)
GFR calc non Af Amer: >60 mL/min (ref >60)
GLUCOSE: 113 mg/dL — AB (ref 65–99)
Potassium: 3.5 mmol/L (ref 3.5–5.1)
Sodium: 135 mmol/L (ref 135–145)
TOTAL PROTEIN: 7.5 g/dL (ref 6.5–8.1)

## 2016-04-26 LAB — URINALYSIS, COMPLETE (UACMP) WITH MICROSCOPIC
Bilirubin Urine: NEGATIVE
Glucose, UA: NEGATIVE mg/dL
Hgb urine dipstick: NEGATIVE
Ketones, ur: 80 mg/dL — AB
Leukocytes, UA: NEGATIVE
NITRITE: NEGATIVE
Protein, ur: 30 mg/dL — AB
Specific Gravity, Urine: 1.031 — ABNORMAL HIGH (ref 1.005–1.030)
pH: 5 (ref 5.0–8.0)

## 2016-04-26 LAB — CBC
HEMATOCRIT: 39.7 % (ref 35.0–47.0)
Hemoglobin: 13.2 g/dL (ref 12.0–16.0)
MCH: 28.1 pg (ref 26.0–34.0)
MCHC: 33.1 g/dL (ref 32.0–36.0)
MCV: 84.8 fL (ref 80.0–100.0)
Platelets: 240 10*3/uL (ref 150–440)
RBC: 4.69 MIL/uL (ref 3.80–5.20)
RDW: 14 % (ref 11.5–14.5)
WBC: 14.9 10*3/uL — ABNORMAL HIGH (ref 3.6–11.0)

## 2016-04-26 LAB — POCT PREGNANCY, URINE: PREG TEST UR: POSITIVE — AB

## 2016-04-26 LAB — LIPASE, BLOOD: LIPASE: 13 U/L (ref 11–51)

## 2016-04-26 MED ORDER — OXYCODONE-ACETAMINOPHEN 5-325 MG PO TABS
1.0000 | ORAL_TABLET | Freq: Once | ORAL | Status: AC
  Administered 2016-04-26: 1 via ORAL
  Filled 2016-04-26: qty 1

## 2016-04-26 MED ORDER — SODIUM CHLORIDE 0.9 % IV BOLUS (SEPSIS)
500.0000 mL | Freq: Once | INTRAVENOUS | Status: AC
  Administered 2016-04-26: 500 mL via INTRAVENOUS

## 2016-04-26 MED ORDER — SODIUM CHLORIDE 0.9 % IV BOLUS (SEPSIS)
1000.0000 mL | Freq: Once | INTRAVENOUS | Status: AC
  Administered 2016-04-26: 1000 mL via INTRAVENOUS

## 2016-04-26 MED ORDER — OXYCODONE-ACETAMINOPHEN 5-325 MG PO TABS: 1.0000 | ORAL_TABLET | Freq: Four times a day (QID) | ORAL | 0 refills | Status: DC | PRN

## 2016-04-26 MED ORDER — ONDANSETRON HCL 4 MG/2ML IJ SOLN
4.0000 mg | Freq: Once | INTRAMUSCULAR | Status: AC
  Administered 2016-04-26: 4 mg via INTRAVENOUS
  Filled 2016-04-26: qty 2

## 2016-04-26 MED ORDER — METOCLOPRAMIDE HCL 10 MG PO TABS: 10.0000 mg | ORAL_TABLET | Freq: Three times a day (TID) | ORAL | 0 refills | Status: DC | PRN

## 2016-04-26 NOTE — ED Notes (Addendum)
Dr. Webster at the bedside for pt evaluation 

## 2016-04-26 NOTE — ED Notes (Signed)
Pt given ginger ale per MD request for PO challenge.

## 2016-04-26 NOTE — ED Provider Notes (Signed)
[facility] Emergency Department Provider Note   ____________________________________________   First MD Initiated Contact with Patient 04/26/16 0036     (approximate)  I have reviewed the triage vital signs and the nursing notes.   HISTORY  Chief Complaint Abdominal Pain    HPI Brenda Wang is a 26 y.o. female who comes into the hospital today with some abdominal pain radiating to both sides of her back. She reports this started today. She has not had any fevers. She has had some nausea and has been vomiting here in the emergency department. The last time she felt like that she was in labor. The patient is [redacted] weeks pregnant. She's had no problems with her pregnancy and sees [facility]. The patient has not taken anything for pain. It's not more than one side of the other. Abdomen and going to her bilateral flanks. The patient reports she's had some urinary frequency but denies any pain or odor. Her pain is a 6 out of 10 in intensity. The patient is a G4 P2 012.   Past Medical History:  Diagnosis Date  . Anemia   . H/O: cesarean section    FTP and NRFHT  . Seizures ([place])    as a child    Patient Active Problem List   Diagnosis Date Noted  . Headache 10/05/2015  . Diarrhea 10/05/2015  . S/P cesarean section 08/10/2015  . Anemia, iron deficiency 01/29/2013  . B12 deficiency 01/29/2013  . Contraceptive management 01/29/2013  . Menstrual irregularity 01/28/2013    Past Surgical History:  Procedure Laterality Date  . CESAREAN SECTION  01/23/2014  . CESAREAN SECTION N/A 08/10/2015   Procedure: CESAREAN SECTION;  Surgeon: Malachy Mood, MD;  Location: [facility] ORS;  Service: Obstetrics;  Laterality: N/A;  . THERAPEUTIC ABORTION    . WISDOM TOOTH EXTRACTION      Prior to Admission medications   Medication Sig Start Date End Date Taking? Authorizing Provider  Prenatal Multivit-Min-Fe-FA (PRENATAL VITAMINS PO) Take 1 tablet by mouth  daily.   Yes Historical Provider, MD  metoCLOPramide (REGLAN) 10 MG tablet Take 1 tablet (10 mg total) by mouth every 8 (eight) hours as needed. 04/26/16   Loney Hering, MD  ondansetron (ZOFRAN ODT) 4 MG disintegrating tablet Take 1 tablet (4 mg total) by mouth every 8 (eight) hours as needed for nausea or vomiting. Patient not taking: Reported on 04/19/2016 10/05/15   Coral Spikes, DO  oxyCODONE-acetaminophen (ROXICET) 5-325 MG tablet Take 1 tablet by mouth every 6 (six) hours as needed. 04/26/16   Loney Hering, MD    Allergies Sulfa antibiotics  Family History  Problem Relation Age of Onset  . Cancer Paternal Grandfather     colon ca  . Hypertension Paternal Grandmother   . Diabetes Paternal Grandmother     Social History Social History  Substance Use Topics  . Smoking status: Never Smoker  . Smokeless tobacco: Never Used  . Alcohol use No    Review of Systems Constitutional: No fever/chills Eyes: No visual changes. ENT: No sore throat. Cardiovascular: Denies chest pain. Respiratory: Denies shortness of breath. Gastrointestinal:  abdominal pain. nausea, vomiting.  No diarrhea.  No constipation. Genitourinary: Negative for dysuria. Musculoskeletal:  back pain. Skin: Negative for rash. Neurological: Negative for headaches, focal weakness or numbness.  10-point ROS otherwise negative.  ____________________________________________   PHYSICAL EXAM:  VITAL SIGNS: ED Triage Vitals  Enc Vitals Group     BP 04/26/16 0019 127/90  Pulse Rate 04/26/16 0019 (!) 108     Resp 04/26/16 0019 20     Temp 04/26/16 0019 99.1 F (37.3 C)     Temp Source 04/26/16 0019 Oral     SpO2 04/26/16 0019 99 %     Weight 04/26/16 0020 172 lb (78 kg)     Height 04/26/16 0020 5\' 4"  (1.626 m)     Head Circumference --      Peak Flow --      Pain Score 04/26/16 0020 7     Pain Loc --      Pain Edu? --      Excl. in [place]? --     Constitutional: Alert and oriented. Well appearing  and in moderate distress. Eyes: Conjunctivae are normal. PERRL. EOMI. Head: Atraumatic. Nose: No congestion/rhinnorhea. Mouth/Throat: Mucous membranes are moist.  Oropharynx non-erythematous. Cardiovascular: Normal rate, regular rhythm. Grossly normal heart sounds.  Good peripheral circulation. Respiratory: Normal respiratory effort.  No retractions. Lungs CTAB. Gastrointestinal: Soft, gravid uterus. Positive bowel sounds, b/l cva tenderness Musculoskeletal: No lower extremity tenderness nor edema.  Neurologic:  Normal speech and language.  Skin:  Skin is warm, dry and intact.  Psychiatric: Mood and affect are normal.   ____________________________________________   LABS (all labs ordered are listed, but only abnormal results are displayed)  Labs Reviewed  COMPREHENSIVE METABOLIC PANEL - Abnormal; Notable for the following:       Result Value   Glucose, Bld 113 (*)    Calcium 8.2 (*)    Albumin 3.4 (*)    All other components within normal limits  CBC - Abnormal; Notable for the following:    WBC 14.9 (*)    All other components within normal limits  URINALYSIS, COMPLETE (UACMP) WITH MICROSCOPIC - Abnormal; Notable for the following:    Color, Urine YELLOW (*)    APPearance HAZY (*)    Specific Gravity, Urine 1.031 (*)    Ketones, ur 80 (*)    Protein, ur 30 (*)    Bacteria, UA RARE (*)    Squamous Epithelial / LPF 6-30 (*)    All other components within normal limits  POCT PREGNANCY, URINE - Abnormal; Notable for the following:    Preg Test, Ur POSITIVE (*)    All other components within normal limits  LIPASE, BLOOD  POC URINE PREG, ED   ____________________________________________  EKG  none ____________________________________________  RADIOLOGY  US OB US kidney ____________________________________________   PROCEDURES  Procedure(s) performed: None  Procedures  Critical Care performed: No  ____________________________________________   INITIAL  IMPRESSION / ASSESSMENT AND PLAN / ED COURSE  Pertinent labs & imaging results that were available during my care of the patient were reviewed by me and considered in my medical decision making (see chart for details).  This is a 26 year old who comes into the hospital today with some abdominal pain and flank pain. The patient is having some nausea and vomiting and she is [redacted] weeks pregnant. I'm concerned about a possible UTI. I will send the patient for an ultrasound to ensure she didn't have any hydronephrosis and I am at this time waiting for the results of her urinalysis. The patient did receive a dose of Zofran and a liter of normal saline.  Clinical Course as of Apr 27 831  Wed Apr 26, 2016  0319 Single live intrauterine pregnancy with an estimated gestational age of [redacted] weeks, 1 day concordant with LMP. No acute abnormality identified.   US OB Limited [  AW]  0320 Normal ultrasound appearance of the kidneys.  No hydronephrosis. US Renal [AW]  0731 Cholelithiasis. No gallbladder wall thickening or pericholecystic fluid. Study otherwise unremarkable.   US Abdomen Limited RUQ [AW]    Clinical Course User Index [AW] Loney Hering, MD   I gave her a second liter of NS as she remained tachycardic. I contacted Dr. Georgianne Fick and he recommended a lipase and an ultrasound. The ultrasound shows some gallstones with no cholecystitis. The patient's tachycardia is improved. She will be discharged home to follow-up with herOB/ GYN physician. She should return with any worsening symptoms.   ____________________________________________   FINAL CLINICAL IMPRESSION(S) / ED DIAGNOSES  Final diagnoses:  Abdominal pain  Gallstones  Nausea and vomiting, intractability of vomiting not specified, unspecified vomiting type      NEW MEDICATIONS STARTED DURING THIS VISIT:  New Prescriptions   METOCLOPRAMIDE (REGLAN) 10 MG TABLET    Take 1 tablet (10 mg total) by mouth every 8 (eight) hours as  needed.   OXYCODONE-ACETAMINOPHEN (ROXICET) 5-325 MG TABLET    Take 1 tablet by mouth every 6 (six) hours as needed.     Note:  This document was prepared using Dragon voice recognition software and may include unintentional dictation errors.    Loney Hering, MD 04/26/16 [phone]

## 2016-04-26 NOTE — ED Notes (Addendum)
Pt reports that after urinating she noticed an increase in pain to her back and her right side.

## 2016-04-26 NOTE — Discharge Instructions (Signed)
Please follow up with your OB/GYN. Please refrain from greasy foods and spicy foods

## 2016-04-26 NOTE — ED Notes (Signed)
Pt actively vomiting. MD aware.

## 2016-04-26 NOTE — ED Notes (Signed)
Patient transported to Ultrasound 

## 2016-04-26 NOTE — ED Notes (Signed)
Pt returned from ultrasound

## 2016-04-26 NOTE — ED Triage Notes (Signed)
Pt in with co lower abd cramping radiates to back. Pt is approx [redacted] weeks pregnant no complications prior to today. Pt denies any abd pain at this time.

## 2016-04-27 ENCOUNTER — Telehealth: Payer: Self-pay | Admitting: Obstetrics and Gynecology

## 2016-04-27 NOTE — Telephone Encounter (Signed)
Pt is calling today from being seening Last night 04/26/16 in the ER was to follow up from Gallstones. Pt report she is [redacted] weeks pregnant . Please Advise.

## 2016-04-27 NOTE — Telephone Encounter (Signed)
Keep reg appt; sooner if pain severe; then can refer to surgeon regarding gallstones as appropriate (otherwise don't want to rush into surgery while pregnant)

## 2016-04-27 NOTE — Telephone Encounter (Signed)
Pt not available will call back later

## 2016-04-28 NOTE — Telephone Encounter (Signed)
Pt aware and states she is not in any pain

## 2016-04-28 NOTE — Telephone Encounter (Signed)
Left message to return call 

## 2016-05-11 ENCOUNTER — Telehealth: Payer: Self-pay | Admitting: Obstetrics and Gynecology

## 2016-05-11 NOTE — Telephone Encounter (Signed)
Pt is schedule on 05/18/16 reschedule due to Opal Sidles not in office to 05/22/16 with Glennon Mac

## 2016-05-18 ENCOUNTER — Encounter: Payer: BLUE CROSS/BLUE SHIELD | Admitting: Advanced Practice Midwife

## 2016-05-18 ENCOUNTER — Ambulatory Visit (INDEPENDENT_AMBULATORY_CARE_PROVIDER_SITE_OTHER): Payer: BLUE CROSS/BLUE SHIELD

## 2016-05-18 DIAGNOSIS — Z369 Encounter for antenatal screening, unspecified: Secondary | ICD-10-CM

## 2016-05-18 DIAGNOSIS — Z3A16 16 weeks gestation of pregnancy: Secondary | ICD-10-CM | POA: Diagnosis not present

## 2016-05-22 ENCOUNTER — Ambulatory Visit (INDEPENDENT_AMBULATORY_CARE_PROVIDER_SITE_OTHER): Payer: BLUE CROSS/BLUE SHIELD | Admitting: Obstetrics and Gynecology

## 2016-05-22 VITALS — BP 114/74 | Wt 185.0 lb

## 2016-05-22 DIAGNOSIS — O0992 Supervision of high risk pregnancy, unspecified, second trimester: Secondary | ICD-10-CM

## 2016-05-22 DIAGNOSIS — Z98891 History of uterine scar from previous surgery: Secondary | ICD-10-CM

## 2016-05-22 DIAGNOSIS — O99212 Obesity complicating pregnancy, second trimester: Secondary | ICD-10-CM

## 2016-05-22 DIAGNOSIS — O0993 Supervision of high risk pregnancy, unspecified, third trimester: Secondary | ICD-10-CM | POA: Insufficient documentation

## 2016-05-22 DIAGNOSIS — Z3A21 21 weeks gestation of pregnancy: Secondary | ICD-10-CM

## 2016-05-22 DIAGNOSIS — Z6830 Body mass index (BMI) 30.0-30.9, adult: Secondary | ICD-10-CM | POA: Insufficient documentation

## 2016-05-22 NOTE — Progress Notes (Signed)
Anatomy scan done 4/5, complete.  No vb. No lof.

## 2016-06-19 ENCOUNTER — Ambulatory Visit (INDEPENDENT_AMBULATORY_CARE_PROVIDER_SITE_OTHER): Payer: BLUE CROSS/BLUE SHIELD | Admitting: Obstetrics and Gynecology

## 2016-06-19 VITALS — BP 104/62 | Wt 187.0 lb

## 2016-06-19 DIAGNOSIS — Z3482 Encounter for supervision of other normal pregnancy, second trimester: Secondary | ICD-10-CM

## 2016-06-19 DIAGNOSIS — Z3A25 25 weeks gestation of pregnancy: Secondary | ICD-10-CM

## 2016-06-19 DIAGNOSIS — Z113 Encounter for screening for infections with a predominantly sexual mode of transmission: Secondary | ICD-10-CM

## 2016-07-12 ENCOUNTER — Encounter: Payer: BLUE CROSS/BLUE SHIELD | Admitting: Obstetrics and Gynecology

## 2016-07-12 ENCOUNTER — Other Ambulatory Visit: Payer: BLUE CROSS/BLUE SHIELD

## 2016-07-21 ENCOUNTER — Other Ambulatory Visit: Payer: Self-pay | Admitting: Obstetrics and Gynecology

## 2016-07-21 ENCOUNTER — Encounter: Payer: Self-pay | Admitting: Obstetrics and Gynecology

## 2016-07-21 ENCOUNTER — Other Ambulatory Visit: Payer: BLUE CROSS/BLUE SHIELD

## 2016-07-21 ENCOUNTER — Ambulatory Visit (INDEPENDENT_AMBULATORY_CARE_PROVIDER_SITE_OTHER): Payer: BLUE CROSS/BLUE SHIELD | Admitting: Obstetrics and Gynecology

## 2016-07-21 VITALS — BP 110/70 | Wt 193.0 lb

## 2016-07-21 DIAGNOSIS — O0993 Supervision of high risk pregnancy, unspecified, third trimester: Secondary | ICD-10-CM

## 2016-07-21 DIAGNOSIS — Z3A25 25 weeks gestation of pregnancy: Secondary | ICD-10-CM

## 2016-07-21 DIAGNOSIS — Z6830 Body mass index (BMI) 30.0-30.9, adult: Secondary | ICD-10-CM

## 2016-07-21 DIAGNOSIS — Z3482 Encounter for supervision of other normal pregnancy, second trimester: Secondary | ICD-10-CM

## 2016-07-21 DIAGNOSIS — Z113 Encounter for screening for infections with a predominantly sexual mode of transmission: Secondary | ICD-10-CM

## 2016-07-21 DIAGNOSIS — Z98891 History of uterine scar from previous surgery: Secondary | ICD-10-CM

## 2016-07-21 DIAGNOSIS — Z3A30 30 weeks gestation of pregnancy: Secondary | ICD-10-CM

## 2016-07-21 NOTE — Addendum Note (Signed)
Addended by: Brien Few on: 07/21/2016 11:49 AM   Modules accepted: Orders

## 2016-07-21 NOTE — Progress Notes (Signed)
28 week labs today. Novb. No lof. Blood transfusion signed and TDAP today CS scheduled for 8/14 today.

## 2016-07-24 ENCOUNTER — Encounter: Payer: Self-pay | Admitting: Obstetrics and Gynecology

## 2016-07-24 LAB — 28 WEEK RH+PANEL
BASOS: 0 %
Basophils Absolute: 0 10*3/uL (ref 0.0–0.2)
EOS (ABSOLUTE): 0.1 10*3/uL (ref 0.0–0.4)
Eos: 1 %
GESTATIONAL DIABETES SCREEN: 78 mg/dL (ref 65–139)
HEMOGLOBIN: 10.8 g/dL — AB (ref 11.1–15.9)
HIV SCREEN 4TH GENERATION: NONREACTIVE
Hematocrit: 34.5 % (ref 34.0–46.6)
Immature Grans (Abs): 0 10*3/uL (ref 0.0–0.1)
Immature Granulocytes: 0 %
LYMPHS ABS: 1.8 10*3/uL (ref 0.7–3.1)
LYMPHS: 23 %
MCH: 26.9 pg (ref 26.6–33.0)
MCHC: 31.3 g/dL — ABNORMAL LOW (ref 31.5–35.7)
MCV: 86 fL (ref 79–97)
MONOS ABS: 0.3 10*3/uL (ref 0.1–0.9)
Monocytes: 4 %
NEUTROS ABS: 5.6 10*3/uL (ref 1.4–7.0)
NEUTROS PCT: 72 %
PLATELETS: 212 10*3/uL (ref 150–379)
RBC: 4.01 x10E6/uL (ref 3.77–5.28)
RDW: 14.1 % (ref 12.3–15.4)
RPR Ser Ql: NONREACTIVE
WBC: 7.7 10*3/uL (ref 3.4–10.8)

## 2016-08-04 ENCOUNTER — Ambulatory Visit (INDEPENDENT_AMBULATORY_CARE_PROVIDER_SITE_OTHER): Payer: BLUE CROSS/BLUE SHIELD | Admitting: Advanced Practice Midwife

## 2016-08-04 ENCOUNTER — Telehealth: Payer: Self-pay

## 2016-08-04 ENCOUNTER — Encounter: Payer: BLUE CROSS/BLUE SHIELD | Admitting: Advanced Practice Midwife

## 2016-08-04 VITALS — BP 110/50 | Wt 196.0 lb

## 2016-08-04 DIAGNOSIS — Z3A32 32 weeks gestation of pregnancy: Secondary | ICD-10-CM

## 2016-08-04 NOTE — Telephone Encounter (Signed)
Pt returned call and request a call back. Thanks TNP

## 2016-08-04 NOTE — Telephone Encounter (Signed)
Discussed w/JEG. [place]. Pt advised to contact us. Need further details to see if pt should keep 3:30 apt w/JEG or report to L&D for eval. Pt TRC & ask for [employer]

## 2016-08-04 NOTE — Progress Notes (Signed)
Has been feeling dehydrated today- light headed and seeing spots. She has vomited today but denies s/s of illness. Urine negative for ketones. Feeling a little better now than she did earlier this morning. Discussed FKCs. No LOF, VB, Ctx's. Encouraged increased intake of fluids and increased protein throughout the day- meals and snacks.

## 2016-08-04 NOTE — Progress Notes (Signed)
C/o doesn't feel well today.  Threw up four times at work this am - the last time was ~ 11am.  Was sent home from work.rj  Ketones negative rj

## 2016-08-04 NOTE — Telephone Encounter (Signed)
LMTRC

## 2016-08-04 NOTE — Telephone Encounter (Signed)
Spoke w/pt. This is her 3rd pregnancy & each time she has had to have IV for dehydration. States she has been drinking 1 bottle of water per hour today. Last time she was able to keep anything down was late yesterday evening. She denies contractions. Advised to keep apt w/JEG @3 :30 today for ROB/eval, but can arrive as soon as she is ready.

## 2016-08-04 NOTE — Telephone Encounter (Signed)
Pt states she has been dehydrated all week. She had to leave work early bc she was going to restroom vomitting. States she usually gets an IV. CB#[phone].

## 2016-08-18 ENCOUNTER — Encounter: Payer: BLUE CROSS/BLUE SHIELD | Admitting: Advanced Practice Midwife

## 2016-08-22 ENCOUNTER — Ambulatory Visit (INDEPENDENT_AMBULATORY_CARE_PROVIDER_SITE_OTHER): Payer: BLUE CROSS/BLUE SHIELD | Admitting: Advanced Practice Midwife

## 2016-08-22 VITALS — BP 112/56 | Wt 193.0 lb

## 2016-08-22 DIAGNOSIS — Z3A34 34 weeks gestation of pregnancy: Secondary | ICD-10-CM

## 2016-08-22 NOTE — Progress Notes (Signed)
Doing well today. Has question about when surgery for gall bladder removal will be scheduled. She will talk with Dr Georgianne Fick about that either at [facility] or at time of c/s. No LOF, VB, Ctx's.

## 2016-08-22 NOTE — Progress Notes (Signed)
No concerns. 

## 2016-08-28 ENCOUNTER — Ambulatory Visit (INDEPENDENT_AMBULATORY_CARE_PROVIDER_SITE_OTHER): Payer: BLUE CROSS/BLUE SHIELD | Admitting: Advanced Practice Midwife

## 2016-08-28 VITALS — BP 112/62 | Wt 192.0 lb

## 2016-08-28 DIAGNOSIS — Z3A35 35 weeks gestation of pregnancy: Secondary | ICD-10-CM

## 2016-08-28 DIAGNOSIS — R112 Nausea with vomiting, unspecified: Secondary | ICD-10-CM

## 2016-08-28 MED ORDER — ONDANSETRON 4 MG PO TBDP: 4.0000 mg | ORAL_TABLET | Freq: Four times a day (QID) | ORAL | 1 refills | Status: DC | PRN

## 2016-08-28 NOTE — Progress Notes (Signed)
Patient has had a recent death in the family and since then she has been nauseated and vomiting. This has been going on since last Tuesday. She has kept liquids down. Rx sent for zofran and recommendation to rest, hydrate. Call for worsening s/s HA, blurry vision, epigastric pain. She has appointment already scheduled for early next week.

## 2016-08-28 NOTE — Progress Notes (Signed)
Work in today for N&V constantly Diarrhea x6 days UDIP negative Ketones/Nitrites

## 2016-09-01 ENCOUNTER — Observation Stay
Admission: EM | Admit: 2016-09-01 | Discharge: 2016-09-01 | Disposition: A | Discharge status: Home / Self Care | Payer: BLUE CROSS/BLUE SHIELD | Attending: Obstetrics & Gynecology | Admitting: Obstetrics & Gynecology

## 2016-09-01 DIAGNOSIS — E860 Dehydration: Secondary | ICD-10-CM

## 2016-09-01 DIAGNOSIS — F4329 Adjustment disorder with other symptoms: Secondary | ICD-10-CM | POA: Diagnosis not present

## 2016-09-01 DIAGNOSIS — O219 Vomiting of pregnancy, unspecified: Secondary | ICD-10-CM | POA: Diagnosis present

## 2016-09-01 DIAGNOSIS — Z3A36 36 weeks gestation of pregnancy: Secondary | ICD-10-CM

## 2016-09-01 DIAGNOSIS — O0993 Supervision of high risk pregnancy, unspecified, third trimester: Secondary | ICD-10-CM

## 2016-09-01 DIAGNOSIS — O218 Other vomiting complicating pregnancy: Secondary | ICD-10-CM | POA: Diagnosis present

## 2016-09-01 DIAGNOSIS — Z6830 Body mass index (BMI) 30.0-30.9, adult: Secondary | ICD-10-CM

## 2016-09-01 LAB — URINALYSIS, COMPLETE (UACMP) WITH MICROSCOPIC
BILIRUBIN URINE: NEGATIVE
Bacteria, UA: NONE SEEN
Bacteria, UA: NONE SEEN
Bilirubin Urine: NEGATIVE
Glucose, UA: NEGATIVE mg/dL
Glucose, UA: NEGATIVE mg/dL
HGB URINE DIPSTICK: NEGATIVE
Hgb urine dipstick: NEGATIVE
KETONES UR: 20 mg/dL — AB
KETONES UR: 20 mg/dL — AB
LEUKOCYTES UA: NEGATIVE
Leukocytes, UA: NEGATIVE
NITRITE: NEGATIVE
Nitrite: NEGATIVE
PH: 6 (ref 5.0–8.0)
PH: 7 (ref 5.0–8.0)
PROTEIN: NEGATIVE mg/dL
Protein, ur: NEGATIVE mg/dL
RBC / HPF: NONE SEEN RBC/hpf (ref 0–5)
RBC / HPF: NONE SEEN RBC/hpf (ref 0–5)
SPECIFIC GRAVITY, URINE: 1.012 (ref 1.005–1.030)
Specific Gravity, Urine: 1.013 (ref 1.005–1.030)

## 2016-09-01 LAB — CBC
HCT: 33.4 % — ABNORMAL LOW (ref 35.0–47.0)
Hemoglobin: 11.2 g/dL — ABNORMAL LOW (ref 12.0–16.0)
MCH: 26.9 pg (ref 26.0–34.0)
MCHC: 33.5 g/dL (ref 32.0–36.0)
MCV: 80.5 fL (ref 80.0–100.0)
PLATELETS: 199 10*3/uL (ref 150–440)
RBC: 4.15 MIL/uL (ref 3.80–5.20)
RDW: 14.6 % — AB (ref 11.5–14.5)
WBC: 9.9 10*3/uL (ref 3.6–11.0)

## 2016-09-01 LAB — COMPREHENSIVE METABOLIC PANEL
ALBUMIN: 2.7 g/dL — AB (ref 3.5–5.0)
ALT: 20 U/L (ref 14–54)
AST: 37 U/L (ref 15–41)
Alkaline Phosphatase: 100 U/L (ref 38–126)
Anion gap: 8 (ref 5–15)
BILIRUBIN TOTAL: 0.7 mg/dL (ref 0.3–1.2)
BUN: 7 mg/dL (ref 6–20)
CO2: 23 mmol/L (ref 22–32)
Calcium: 8.4 mg/dL — ABNORMAL LOW (ref 8.9–10.3)
Chloride: 105 mmol/L (ref 101–111)
Creatinine, Ser: 0.73 mg/dL (ref 0.44–1.00)
GFR calc Af Amer: >60 mL/min (ref >60)
GFR calc non Af Amer: >60 mL/min (ref >60)
GLUCOSE: 90 mg/dL (ref 65–99)
POTASSIUM: 4.1 mmol/L (ref 3.5–5.1)
SODIUM: 136 mmol/L (ref 135–145)
Total Protein: 6.2 g/dL — ABNORMAL LOW (ref 6.5–8.1)

## 2016-09-01 LAB — TYPE AND SCREEN
ABO/RH(D): O POS
Antibody Screen: NEGATIVE

## 2016-09-01 MED ORDER — ACETAMINOPHEN 325 MG PO TABS: 650.0000 mg | ORAL_TABLET | ORAL | Status: DC | PRN

## 2016-09-01 MED ORDER — PROMETHAZINE HCL 12.5 MG PO TABS: 25.0000 mg | ORAL_TABLET | Freq: Four times a day (QID) | ORAL | 0 refills | Status: DC | PRN

## 2016-09-01 MED ORDER — LACTATED RINGERS IV SOLN
INTRAVENOUS | Status: DC
Start: 2016-09-01 — End: 2016-09-01
  Administered 2016-09-01: 07:00:00 via INTRAVENOUS

## 2016-09-01 MED ORDER — LACTATED RINGERS IV BOLUS (SEPSIS)
500.0000 mL | Freq: Once | INTRAVENOUS | Status: AC
  Administered 2016-09-01: 500 mL via INTRAVENOUS

## 2016-09-01 MED ORDER — ONDANSETRON 4 MG PO TBDP: 4.0000 mg | ORAL_TABLET | Freq: Four times a day (QID) | ORAL | Status: DC | PRN

## 2016-09-01 MED ORDER — ONDANSETRON HCL 4 MG/2ML IJ SOLN: 4.0000 mg | Freq: Four times a day (QID) | INTRAMUSCULAR | Status: DC | PRN

## 2016-09-01 MED ORDER — PROMETHAZINE HCL 25 MG/ML IJ SOLN
25.0000 mg | INTRAMUSCULAR | Status: DC | PRN
  Administered 2016-09-01: 25 mg via INTRAMUSCULAR
  Filled 2016-09-01: qty 1

## 2016-09-01 NOTE — Final Progress Note (Signed)
Physician Final Progress Note  Patient ID: Brenda Wang MRN: [id number] DOB/AGE: [birth year] 27 y.o.  Admit date: 09/01/2016 Admitting provider: Gae Dry, MD Discharge date: 09/01/2016   Admission Diagnoses: Nausea and vomiting during pregnancy  Discharge Diagnoses:  Active Problems:   Nausea and vomiting during pregnancy  Grief reaction  26 yo G3P2002 at 107w0dpresenting with nausea, vomiting, and dehydration over the past 4 days.  She reports that she recently lost a close family friend unexpectantly and her symptoms started during the funeral.  She has not been sleeping well since that time.  No other sick contact noted on admission H&P.  Labs were checked and normal, no elevation in WBC of LFT elevations. She does have a history of cholelithiasis but other than pain and pressure with contractions she denied RUQ or epigastric pain.  Patient was IV hydrated and given IV phenergan with improvement in symptoms.  She was able to keep down some dry cereal and crackers.  Consults: None  Significant Findings/ Diagnostic Studies:  Results for orders placed or performed during the hospital encounter of 09/01/16 (from the past 48 hour(s))  CBC     Status: Abnormal   Collection Time: 09/01/16  2:19 AM  Result Value Ref Range   WBC 9.9 3.6 - 11.0 K/uL   RBC 4.15 3.80 - 5.20 MIL/uL   Hemoglobin 11.2 (L) 12.0 - 16.0 g/dL   HCT 33.4 (L) 35.0 - 47.0 %   MCV 80.5 80.0 - 100.0 fL   MCH 26.9 26.0 - 34.0 pg   MCHC 33.5 32.0 - 36.0 g/dL   RDW 14.6 (H) 11.5 - 14.5 %   Platelets 199 150 - 440 K/uL  Urinalysis, Complete w Microscopic     Status: Abnormal   Collection Time: 09/01/16  2:19 AM  Result Value Ref Range   Color, Urine YELLOW (A) YELLOW   APPearance CLEAR (A) CLEAR   Specific Gravity, Urine 1.012 1.005 - 1.030   pH 6.0 5.0 - 8.0   Glucose, UA NEGATIVE NEGATIVE mg/dL   Hgb urine dipstick NEGATIVE NEGATIVE   Bilirubin Urine NEGATIVE NEGATIVE   Ketones, ur 20 (A) NEGATIVE  mg/dL   Protein, ur NEGATIVE NEGATIVE mg/dL   Nitrite NEGATIVE NEGATIVE   Leukocytes, UA NEGATIVE NEGATIVE   RBC / HPF NONE SEEN 0 - 5 RBC/hpf   WBC, UA 0-5 0 - 5 WBC/hpf   Bacteria, UA NONE SEEN NONE SEEN   Squamous Epithelial / LPF 0-5 (A) NONE SEEN   Mucous PRESENT   [facility]     Status: None (Preliminary result)   Collection Time: 09/01/16  2:19 AM  Result Value Ref Range   ABO/RH(D) PENDING    Antibody Screen PENDING    Sample Expiration 09/04/2016   Comprehensive metabolic panel     Status: Abnormal   Collection Time: 09/01/16  2:19 AM  Result Value Ref Range   Sodium 136 135 - 145 mmol/L   Potassium 4.1 3.5 - 5.1 mmol/L    Comment: HEMOLYSIS AT THIS LEVEL MAY AFFECT RESULT   Chloride 105 101 - 111 mmol/L   CO2 23 22 - 32 mmol/L   Glucose, Bld 90 65 - 99 mg/dL   BUN 7 6 - 20 mg/dL   Creatinine, Ser 0.73 0.44 - 1.00 mg/dL   Calcium 8.4 (L) 8.9 - 10.3 mg/dL   Total Protein 6.2 (L) 6.5 - 8.1 g/dL   Albumin 2.7 (L) 3.5 - 5.0 g/dL  AST 37 15 - 41 U/L    Comment: HEMOLYSIS AT THIS LEVEL MAY AFFECT RESULT   ALT 20 14 - 54 U/L   Alkaline Phosphatase 100 38 - 126 U/L   Total Bilirubin 0.7 0.3 - 1.2 mg/dL    Comment: HEMOLYSIS AT THIS LEVEL MAY AFFECT RESULT   GFR calc non Af Amer >60 >60 mL/min   GFR calc Af Amer >60 >60 mL/min    Comment: (NOTE) The eGFR has been calculated using the CKD EPI equation. This calculation has not been validated in all clinical situations. eGFR's persistently <60 mL/min signify possible Chronic Kidney Disease.    Anion gap 8 5 - 15  Type and screen     Status: None   Collection Time: 09/01/16  3:42 AM  Result Value Ref Range   ABO/RH(D) O POS    Antibody Screen NEG    Sample Expiration 09/04/2016   Urinalysis, Complete w Microscopic     Status: Abnormal   Collection Time: 09/01/16 10:15 AM  Result Value Ref Range   Color, Urine YELLOW (A) YELLOW   APPearance CLEAR (A) CLEAR   Specific Gravity,  Urine 1.013 1.005 - 1.030   pH 7.0 5.0 - 8.0   Glucose, UA NEGATIVE NEGATIVE mg/dL   Hgb urine dipstick NEGATIVE NEGATIVE   Bilirubin Urine NEGATIVE NEGATIVE   Ketones, ur 20 (A) NEGATIVE mg/dL   Protein, ur NEGATIVE NEGATIVE mg/dL   Nitrite NEGATIVE NEGATIVE   Leukocytes, UA NEGATIVE NEGATIVE   RBC / HPF NONE SEEN 0 - 5 RBC/hpf   WBC, UA 0-5 0 - 5 WBC/hpf   Bacteria, UA NONE SEEN NONE SEEN   Squamous Epithelial / LPF 0-5 (A) NONE SEEN   Mucous PRESENT      Procedures:  Baseline: 130 Variability: moderate Accelerations: present Decelerations: absent Tocometry: none at time of discharge The patient was monitored for 30 minutes, fetal heart rate tracing was deemed reactive, category I tracing,   Discharge Condition: good  Disposition: 01-Home or Self Care  Diet: Regular diet  Discharge Activity: Activity as tolerated  Discharge Instructions    Discharge activity:  No Restrictions    Complete by:  As directed    Discharge diet:  No restrictions    Complete by:  As directed    Fetal Kick Count:  Lie on our left side for one hour after a meal, and count the number of times your baby kicks.  If it is less than 5 times, get up, move around and drink some juice.  Repeat the test 30 minutes later.  If it is still less than 5 kicks in an hour, notify your doctor.    Complete by:  As directed    LABOR:  When conractions begin, you should start to time them from the beginning of one contraction to the beginning  of the next.  When contractions are 5 - 10 minutes apart or less and have been regular for at least an hour, you should call your health care provider.    Complete by:  As directed    No sexual activity restrictions    Complete by:  As directed    Notify physician for bleeding from the vagina    Complete by:  As directed    Notify physician for blurring of vision or spots before the eyes    Complete by:  As directed    Notify physician for chills or fever    Complete by:   As directed  Notify physician for fainting spells, "black outs" or loss of consciousness    Complete by:  As directed    Notify physician for increase in vaginal discharge    Complete by:  As directed    Notify physician for leaking of fluid    Complete by:  As directed    Notify physician for pain or burning when urinating    Complete by:  As directed    Notify physician for pelvic pressure (sudden increase)    Complete by:  As directed    Notify physician for severe or continued nausea or vomiting    Complete by:  As directed    Notify physician for sudden gushing of fluid from the vagina (with or without continued leaking)    Complete by:  As directed    Notify physician for sudden, constant, or occasional abdominal pain    Complete by:  As directed    Notify physician if baby moving less than usual    Complete by:  As directed      Allergies as of 09/01/2016      Reactions   Sulfa Antibiotics Hives      Medication List    TAKE these medications   ondansetron 4 MG disintegrating tablet Commonly known as:  ZOFRAN ODT Take 1 tablet (4 mg total) by mouth every 6 (six) hours as needed for nausea or vomiting.   PRENATAL VITAMINS PO Take 1 tablet by mouth daily.   promethazine 12.5 MG tablet Commonly known as:  PHENERGAN Take 2 tablets (25 mg total) by mouth every 6 (six) hours as needed for nausea or vomiting.      Follow-up Information    [facility]. Go to.   Why:  Keep regularly scheduled appointment. Contact information: [address] [place] [address] [phone]          Total time spent taking care of this patient: 30 minutes  Signed: Malachy Mood 09/01/2016, 11:02 AM

## 2016-09-01 NOTE — OB Triage Note (Signed)
Pt. Arrived in triage, c/o contractions since Wed and n/v since Tues of the week prior.  Pt unsure of leaking of fluid however states she has had to "change underwear 6 times" since noon today.  Denies vaginal bleeding.  Reports good fetal movement.  Abdomen palpates soft and fetal movement palpated.  EFM applies and plan of care discussed-pt verbalizes understanding.

## 2016-09-01 NOTE — H&P (Signed)
Obstetrics Admission History & Physical   Abdominal Cramping (contractions) and Nausea   HPI:  26 y.o. [id number] @ [redacted]w[redacted]d (09/29/2016, by Ultrasound). Admitted on 09/01/2016:   Patient Active Problem List   Diagnosis Date Noted  . Nausea and vomiting during pregnancy 09/01/2016  . Supervision of high risk pregnancy, antepartum, third trimester 05/22/2016  . BMI 30.0-30.9,adult 05/22/2016  . Headache 10/05/2015  . Diarrhea 10/05/2015  . S/P cesarean section 08/10/2015  . Anemia, iron deficiency 01/29/2013  . B12 deficiency 01/29/2013  . Menstrual irregularity 01/28/2013    Presents for nausea for several days, taking Zofran w min relief, and now has some lower abd crampy pains, not like ctxs. Good FM.  Has had gall stones in past.  Has h/o 2 prior CS and is planning CS at 39 weeks.   Prenatal care at: at [facility]. Pregnancy complicated by prior CS.  ROS: A review of systems was performed and negative, except as stated in the above HPI. PMHx:  Past Medical History:  Diagnosis Date  . Anemia   . H/O: cesarean section    FTP and NRFHT  . Seizures ([place])    as a child   PSHx:  Past Surgical History:  Procedure Laterality Date  . CESAREAN SECTION  01/23/2014  . CESAREAN SECTION N/A 08/10/2015   Procedure: CESAREAN SECTION;  Surgeon: Malachy Mood, MD;  Location: [facility] ORS;  Service: Obstetrics;  Laterality: N/A;  . THERAPEUTIC ABORTION    . WISDOM TOOTH EXTRACTION     Medications:  Prescriptions Prior to Admission  Medication Sig Dispense Refill Last Dose  . ondansetron (ZOFRAN ODT) 4 MG disintegrating tablet Take 1 tablet (4 mg total) by mouth every 6 (six) hours as needed for nausea or vomiting. 20 tablet 1 08/31/2016 at 1900  . Prenatal Multivit-Min-Fe-FA (PRENATAL VITAMINS PO) Take 1 tablet by mouth daily.   08/31/2016 at 0800   Allergies: is allergic to sulfa antibiotics. OBHx:  OB History  Gravida Para Term Preterm AB Living  3 2 2     2   SAB TAB Ectopic Multiple Live  Births        0 2    # Outcome Date GA Lbr Len/2nd Weight Sex Delivery Anes PTL Lv  3 Current           2 Term 08/10/15 [redacted]w[redacted]d  7 lb 6 oz (3.345 kg) M CS-LTranv Spinal  LIV  1 Term 01/23/14 [redacted]w[redacted]d  7 lb 10 oz (3.459 kg) M CS-LTranv Spinal N LIV     Complications: Fetal Intolerance,H/O: cesarean section     GMW:NUUVOZDG/UYQIHKVQQVZD except as detailed in HPI.Marland Kitchen  No family history of birth defects. Soc Hx: Alcohol: none and Recreational drug use: none  Objective:   Vitals:   09/01/16 0123  BP: 125/80  Pulse: (!) 105  Resp: 18  Temp: 98.3 F (36.8 C)   Constitutional: Well nourished, well developed female in no acute distress.  HEENT: normal Skin: Warm and dry.  Cardiovascular:Regular rate and rhythm.   Extremity: trace to 1+ bilateral pedal edema Respiratory: Clear to auscultation bilateral. Normal respiratory effort Abdomen: moderate Back: no CVAT Neuro: DTRs 2+, Cranial nerves grossly intact Psych: Alert and Oriented x3. No memory deficits. Normal mood and affect.  MS: normal gait, normal bilateral lower extremity ROM/strength/stability.  Pelvic exam: is not limited by body habitus EGBUS: within normal limits Vagina: within normal limits and with no blood in the vault    Neg pooling, Neg Nitrazine Cervix: Closed/50% Uterus: No contractions observed  for 30 minutes.  Adnexa: not evaluated  EFM:FHR: 140 bpm, variability: moderate,  accelerations:  Present,  decelerations:  Absent Toco: None  Assessment & Plan:   26 y.o. [id number] @ [redacted]w[redacted]d, Admitted on 09/01/2016: nausea, dehydration, lower abd pain    Admit for observation and IVF and Phenergan/Zofran to help w nausea; labs, urine lab  Barnett Applebaum, MD, [place], [facility] 09/01/2016  2:08 AM

## 2016-09-01 NOTE — OB Triage Note (Deleted)
Patient given discharge instructions including follow up information, preterm labor precautions, fetal kick count instructions, and prescriptions.  Patient verbalized understanding.

## 2016-09-01 NOTE — Discharge Summary (Signed)
See final progress note. 

## 2016-09-05 ENCOUNTER — Ambulatory Visit (INDEPENDENT_AMBULATORY_CARE_PROVIDER_SITE_OTHER): Payer: BLUE CROSS/BLUE SHIELD | Admitting: Obstetrics and Gynecology

## 2016-09-05 VITALS — BP 108/56 | Wt 194.0 lb

## 2016-09-05 DIAGNOSIS — Z98891 History of uterine scar from previous surgery: Secondary | ICD-10-CM

## 2016-09-05 DIAGNOSIS — Z3685 Encounter for antenatal screening for Streptococcus B: Secondary | ICD-10-CM

## 2016-09-05 DIAGNOSIS — O219 Vomiting of pregnancy, unspecified: Secondary | ICD-10-CM

## 2016-09-05 DIAGNOSIS — Z3A36 36 weeks gestation of pregnancy: Secondary | ICD-10-CM

## 2016-09-05 DIAGNOSIS — O0993 Supervision of high risk pregnancy, unspecified, third trimester: Secondary | ICD-10-CM

## 2016-09-05 MED ORDER — METOCLOPRAMIDE HCL 10 MG PO TABS: 10.0000 mg | ORAL_TABLET | Freq: Three times a day (TID) | ORAL | 2 refills | Status: DC

## 2016-09-05 NOTE — Progress Notes (Signed)
Add reglan for continue nausea and vomitting, repeat labs today and GBS

## 2016-09-05 NOTE — Progress Notes (Signed)
ROB N&V

## 2016-09-06 ENCOUNTER — Encounter: Payer: Self-pay | Admitting: Obstetrics and Gynecology

## 2016-09-06 LAB — COMPREHENSIVE METABOLIC PANEL
A/G RATIO: 1.3 (ref 1.2–2.2)
ALT: 15 IU/L (ref 0–32)
AST: 19 IU/L (ref 0–40)
Albumin: 3.1 g/dL — ABNORMAL LOW (ref 3.5–5.5)
Alkaline Phosphatase: 118 IU/L — ABNORMAL HIGH (ref 39–117)
BUN/Creatinine Ratio: 5 — ABNORMAL LOW (ref 9–23)
BUN: 4 mg/dL — AB (ref 6–20)
Bilirubin Total: 0.3 mg/dL (ref 0.0–1.2)
CALCIUM: 8.4 mg/dL — AB (ref 8.7–10.2)
CO2: 20 mmol/L (ref 20–29)
CREATININE: 0.81 mg/dL (ref 0.57–1.00)
Chloride: 103 mmol/L (ref 96–106)
GFR, EST AFRICAN AMERICAN: 115 mL/min/{1.73_m2} (ref >59)
GFR, EST NON AFRICAN AMERICAN: 100 mL/min/{1.73_m2} (ref >59)
GLUCOSE: 74 mg/dL (ref 65–99)
Globulin, Total: 2.4 g/dL (ref 1.5–4.5)
Potassium: 4.1 mmol/L (ref 3.5–5.2)
Sodium: 138 mmol/L (ref 134–144)
Total Protein: 5.5 g/dL — ABNORMAL LOW (ref 6.0–8.5)

## 2016-09-06 LAB — CBC
HEMATOCRIT: 34.3 % (ref 34.0–46.6)
HEMOGLOBIN: 10.7 g/dL — AB (ref 11.1–15.9)
MCH: 26 pg — AB (ref 26.6–33.0)
MCHC: 31.2 g/dL — AB (ref 31.5–35.7)
MCV: 83 fL (ref 79–97)
Platelets: 196 10*3/uL (ref 150–379)
RBC: 4.12 x10E6/uL (ref 3.77–5.28)
RDW: 15.1 % (ref 12.3–15.4)
WBC: 7.3 10*3/uL (ref 3.4–10.8)

## 2016-09-07 LAB — STREP GP B NAA: STREP GROUP B AG: POSITIVE — AB

## 2016-09-14 ENCOUNTER — Ambulatory Visit (INDEPENDENT_AMBULATORY_CARE_PROVIDER_SITE_OTHER): Payer: BLUE CROSS/BLUE SHIELD | Admitting: Advanced Practice Midwife

## 2016-09-14 VITALS — BP 110/62 | Wt 194.0 lb

## 2016-09-14 DIAGNOSIS — Z3A37 37 weeks gestation of pregnancy: Secondary | ICD-10-CM

## 2016-09-14 NOTE — Progress Notes (Signed)
No vb. No lof.  

## 2016-09-14 NOTE — Progress Notes (Signed)
Feels much better today since starting the reglan. Has had a few contractions. No LOF, VB. ROB in 1 week and then pre/op, surgery.

## 2016-09-21 ENCOUNTER — Ambulatory Visit (INDEPENDENT_AMBULATORY_CARE_PROVIDER_SITE_OTHER): Payer: BLUE CROSS/BLUE SHIELD | Admitting: Obstetrics & Gynecology

## 2016-09-21 VITALS — BP 122/60 | Wt 196.0 lb

## 2016-09-21 DIAGNOSIS — Z98891 History of uterine scar from previous surgery: Secondary | ICD-10-CM

## 2016-09-21 DIAGNOSIS — Z3A38 38 weeks gestation of pregnancy: Secondary | ICD-10-CM

## 2016-09-21 DIAGNOSIS — O0993 Supervision of high risk pregnancy, unspecified, third trimester: Secondary | ICD-10-CM

## 2016-09-21 NOTE — Progress Notes (Signed)
PRE-OPERATIVE HISTORY AND PHYSICAL EXAM  HPI:  Brenda Wang is a 26 y.o. [id number].  Patient's last menstrual period was 12/24/2015 (exact date).  [redacted]w[redacted]d Estimated Date of Delivery: 09/29/16  She is being admitted for Elective repeat; prior 2 CS's.  PMHx: She  has a past medical history of Anemia; H/O: cesarean section; and Seizures ([place]). Also,  has a past surgical history that includes Therapeutic abortion; Cesarean section (01/23/2014); Wisdom tooth extraction; and Cesarean section (N/A, 08/10/2015)., family history includes Cancer in her paternal grandfather; Diabetes in her paternal grandmother; Hypertension in her paternal grandmother.,  reports that she has never smoked. She has never used smokeless tobacco. She reports that she does not drink alcohol or use drugs.  No outpatient prescriptions have been marked as taking for the 09/21/16 encounter (Routine Prenatal) with Gae Dry, MD.   Also, is allergic to sulfa antibiotics.  Review of Systems  Constitutional: Negative for chills, fever and malaise/fatigue.  HENT: Negative for congestion, sinus pain and sore throat.   Eyes: Negative for blurred vision and pain.  Respiratory: Negative for cough and wheezing.   Cardiovascular: Negative for chest pain and leg swelling.  Gastrointestinal: Negative for abdominal pain, constipation, diarrhea, heartburn, nausea and vomiting.  Genitourinary: Negative for dysuria, frequency, hematuria and urgency.  Musculoskeletal: Negative for back pain, joint pain, myalgias and neck pain.  Skin: Negative for itching and rash.  Neurological: Negative for dizziness, tremors and weakness.  Endo/Heme/Allergies: Does not bruise/bleed easily.  Psychiatric/Behavioral: Negative for depression. The patient is not nervous/anxious and does not have insomnia.     Objective: BP 122/60   Wt 196 lb (88.9 kg)   LMP 12/24/2015 (Exact Date)   BMI 33.64 kg/m  Filed Weights   09/21/16 0959  Weight: 196 lb  (88.9 kg)   Physical Exam  Vitals reviewed. Physical examination Constitutional NAD, Conversant  Skin No rashes, lesions or ulceration. Normal palpated skin turgor. No nodularity.  Lungs: Clear to auscultation.No rales or wheezes. Normal Respiratory effort, no retractions.  Heart: NSR.  No murmurs or rubs appreciated. No periferal edema  Abdomen: Gravid.  Non-tender.  No masses.  No HSM. No hernia  Extremities: Moves all appropriately.  Normal ROM for age. No lymphadenopathy.  Neuro: Grossly intact  Psych: Oriented to PPT.  Normal mood. Normal affect.     Pelvic:   Vulva: Normal appearance.  No lesions.  Vagina: No lesions or abnormalities noted.  Urethra No masses tenderness or scarring.  Meatus Normal size without lesions or prolapse.  Cervix: Closed, 20%, -3.   Perineum: Normal exam.  No lesions.        Bimanual   Uterus: Enlarged.  Non-tender.    Adnexae: Not palpated.  Cul-de-sac: Negative for abnormality.      OB History  Gravida Para Term Preterm AB Living  3 2 2     2   SAB TAB Ectopic Multiple Live Births        0 2    # Outcome Date GA Lbr Len/2nd Weight Sex Delivery Anes PTL Lv  3 Current           2 Term 08/10/15 [redacted]w[redacted]d  7 lb 6 oz (3.345 kg) M CS-LTranv Spinal  LIV  1 Term 01/23/14 [redacted]w[redacted]d  7 lb 10 oz (3.459 kg) M CS-LTranv Spinal N LIV     Complications: Fetal Intolerance,H/O: cesarean section    Patient denies any other pertinent gynecologic issues. See prenatal record for more complete H&P  Assessment: 1. [redacted] weeks gestation of pregnancy   2. Supervision of high risk pregnancy, antepartum, third trimester   3. S/P cesarean section     PLAN: 1.  Cesarean Delivery as Scheduled. Desires repeat and BTL.  Patient will undergo surgical management with Cesarean Section.   The risks of surgery were discussed in detail with the patient including but not limited to: bleeding which may require transfusion or reoperation; infection which may require antibiotics;  injury to surrounding organs which may involve bowel, bladder, ureters ; need for additional procedures including laparoscopy or laparotomy; thromboembolic phenomenon, surgical site problems and other postoperative/anesthesia complications. Likelihood of success in alleviating the patient's condition was discussed. Routine postoperative instructions will be reviewed with the patient and her family in detail after surgery.  The patient concurred with the proposed plan, giving informed written consent for the surgery.  Patient will be NPO procedure.  Preoperative prophylactic antibiotics, as necessary, and SCDs ordered on call to the OR.  The patient has been fully informed about all methods of contraception, both temporary and permanent. She understands that tubal ligation is meant to be permanent, absolute and irreversible. She was told that there is an approximately 1 in 400 chance of a pregnancy in the future after tubal ligation. She was told the short and long term complications of tubal ligation. She understands the risks from this surgery include, but are not limited to, the risks of anesthesia, hemorrhage, infection, perforation, and injury to adjacent structures, bowel, bladder and blood vessels.   Barnett Applebaum, M.D. 09/21/2016 10:23 AM

## 2016-09-22 ENCOUNTER — Observation Stay
Admission: EM | Admit: 2016-09-22 | Discharge: 2016-09-22 | Disposition: A | Discharge status: Home / Self Care | Payer: BLUE CROSS/BLUE SHIELD | Attending: Obstetrics and Gynecology | Admitting: Obstetrics and Gynecology

## 2016-09-22 DIAGNOSIS — R109 Unspecified abdominal pain: Secondary | ICD-10-CM | POA: Insufficient documentation

## 2016-09-22 DIAGNOSIS — O26893 Other specified pregnancy related conditions, third trimester: Secondary | ICD-10-CM | POA: Diagnosis present

## 2016-09-22 DIAGNOSIS — Z3A39 39 weeks gestation of pregnancy: Secondary | ICD-10-CM | POA: Diagnosis not present

## 2016-09-22 DIAGNOSIS — Z6830 Body mass index (BMI) 30.0-30.9, adult: Secondary | ICD-10-CM

## 2016-09-22 DIAGNOSIS — O0993 Supervision of high risk pregnancy, unspecified, third trimester: Secondary | ICD-10-CM

## 2016-09-22 NOTE — OB Triage Note (Addendum)
Pt is 39w 0d that presents from the ED c/o gush of fluid around 1040 today and cramping that started around 0400 today. States some spotting but no notable VB and states +FM.

## 2016-09-22 NOTE — Discharge Summary (Signed)
Physician Final Progress Note  Patient ID: Brenda Wang MRN: [id number] DOB/AGE: Nov 05, 1990 26 y.o.  Admit date: 09/22/2016 Admitting provider: Rod Can, CNM Discharge date: 09/22/2016   Admission Diagnoses: fluid leaking  Discharge Diagnoses:  Active Problems:   Indication for care in labor and delivery, antepartum IUP at [redacted]w[redacted]d with reactive NST, membranes intact, not in labor  History of Present Illness: The patient is a 26 y.o. female G3P2002 at [redacted]w[redacted]d who presents for a gush of fluid around 10 am this morning. She had just urinated and so thought it couldn't be that. She states the fluid was clear and it soaked her pants. She was not wearing a pad when she arrived at the hospital and her pants were dry then. She has not had a recurrence of fluid leaking since then. She admits pelvic cramps all morning that occur every 10 minutes that last for a couple of seconds. She denies vaginal bleeding. She admits positive fetal movement.    Past Medical History:  Diagnosis Date  . Anemia   . H/O: cesarean section    FTP and NRFHT  . Seizures ([place])    as a child    Past Surgical History:  Procedure Laterality Date  . CESAREAN SECTION  01/23/2014  . CESAREAN SECTION N/A 08/10/2015   Procedure: CESAREAN SECTION;  Surgeon: Malachy Mood, MD;  Location: [facility] ORS;  Service: Obstetrics;  Laterality: N/A;  . THERAPEUTIC ABORTION    . WISDOM TOOTH EXTRACTION      No current facility-administered medications on file prior to encounter.    Current Outpatient Prescriptions on File Prior to Encounter  Medication Sig Dispense Refill  . metoCLOPramide (REGLAN) 10 MG tablet Take 1 tablet (10 mg total) by mouth 3 (three) times daily before meals. 90 tablet 2  . ondansetron (ZOFRAN-ODT) 4 MG disintegrating tablet Take 4 mg by mouth 2 (two) times daily as needed for nausea or vomiting.    . Prenatal Multivit-Min-Fe-FA (PRENATAL VITAMINS PO) Take 1 tablet by mouth daily.    . promethazine  (PHENERGAN) 12.5 MG tablet Take 2 tablets (25 mg total) by mouth every 6 (six) hours as needed for nausea or vomiting. (Patient not taking: Reported on 09/22/2016) 30 tablet 0    Allergies  Allergen Reactions  . Sulfa Antibiotics Hives    Social History   Social History  . Marital status: Married    Spouse name: N/A  . Number of children: N/A  . Years of education: N/A   Occupational History  . Not on file.   Social History Main Topics  . Smoking status: Never Smoker  . Smokeless tobacco: Never Used  . Alcohol use No  . Drug use: No  . Sexual activity: Yes    Birth control/ protection: Surgical     Comment: pregnant   Other Topics Concern  . Not on file   Social History Narrative  . No narrative on file    Physical Exam: BP 129/65 (BP Location: Left Arm)   Pulse 89   Temp 97.6 F (36.4 C) (Oral)   Resp 16   LMP 12/24/2015 (Exact Date)   Gen: NAD CV: RRR Pulm: CTAB Pelvic: closed/thick/-3 per RN L [place] Toco: irritability Fetal Well Being: 135 bpm, moderate variability, +accelerations, -decelerations Ext: no evidence of DVT  Consults: None  Significant Findings/ Diagnostic Studies:  Negative Pooling, Negative Nitrazine  Procedures: NST  Discharge Condition: good  Disposition: 01-Home or Self Care  Diet: Regular diet  Discharge Activity: Activity as  tolerated  Discharge Instructions    Discharge activity:  No Restrictions    Complete by:  As directed    Discharge diet:  No restrictions    Complete by:  As directed    Fetal Kick Count:  Lie on our left side for one hour after a meal, and count the number of times your baby kicks.  If it is less than 5 times, get up, move around and drink some juice.  Repeat the test 30 minutes later.  If it is still less than 5 kicks in an hour, notify your doctor.    Complete by:  As directed    LABOR:  When conractions begin, you should start to time them from the beginning of one contraction to the beginning  of  the next.  When contractions are 5 - 10 minutes apart or less and have been regular for at least an hour, you should call your health care provider.    Complete by:  As directed    No sexual activity restrictions    Complete by:  As directed    Notify physician for bleeding from the vagina    Complete by:  As directed    Notify physician for blurring of vision or spots before the eyes    Complete by:  As directed    Notify physician for chills or fever    Complete by:  As directed    Notify physician for fainting spells, "black outs" or loss of consciousness    Complete by:  As directed    Notify physician for increase in vaginal discharge    Complete by:  As directed    Notify physician for leaking of fluid    Complete by:  As directed    Notify physician for pain or burning when urinating    Complete by:  As directed    Notify physician for pelvic pressure (sudden increase)    Complete by:  As directed    Notify physician for severe or continued nausea or vomiting    Complete by:  As directed    Notify physician for sudden gushing of fluid from the vagina (with or without continued leaking)    Complete by:  As directed    Notify physician for sudden, constant, or occasional abdominal pain    Complete by:  As directed    Notify physician if baby moving less than usual    Complete by:  As directed      Allergies as of 09/22/2016      Reactions   Sulfa Antibiotics Hives      Medication List    STOP taking these medications   promethazine 12.5 MG tablet Commonly known as:  PHENERGAN     TAKE these medications   metoCLOPramide 10 MG tablet Commonly known as:  REGLAN Take 1 tablet (10 mg total) by mouth 3 (three) times daily before meals.   ondansetron 4 MG disintegrating tablet Commonly known as:  ZOFRAN-ODT Take 4 mg by mouth 2 (two) times daily as needed for nausea or vomiting.   PRENATAL VITAMINS PO Take 1 tablet by mouth daily.      Follow-up Information     [facility] Follow up.   Why:  c/section scheduled 8/14 Contact information: [place] [address]          Total time spent taking care of this patient: 15 minutes  Signed: Rod Can, CNM  09/22/2016, 12:00 PM

## 2016-09-25 ENCOUNTER — Telehealth: Payer: Self-pay

## 2016-09-25 ENCOUNTER — Encounter
Admission: RE | Admit: 2016-09-25 | Discharge: 2016-09-25 | Disposition: A | Discharge status: Home / Self Care | Payer: BLUE CROSS/BLUE SHIELD | Source: Ambulatory Visit | Attending: Obstetrics and Gynecology | Admitting: Obstetrics and Gynecology

## 2016-09-25 DIAGNOSIS — Z01818 Encounter for other preprocedural examination: Secondary | ICD-10-CM | POA: Insufficient documentation

## 2016-09-25 HISTORY — DX: Nausea with vomiting, unspecified: R11.2

## 2016-09-25 HISTORY — DX: Other specified postprocedural states: Z98.890

## 2016-09-25 LAB — DIFFERENTIAL
BASOS ABS: 0 10*3/uL (ref 0–0.1)
BASOS PCT: 0 %
EOS ABS: 0 10*3/uL (ref 0–0.7)
EOS PCT: 0 %
LYMPHS ABS: 1.4 10*3/uL (ref 1.0–3.6)
Lymphocytes Relative: 19 %
Monocytes Absolute: 0.5 10*3/uL (ref 0.2–0.9)
Monocytes Relative: 7 %
NEUTROS PCT: 74 %
Neutro Abs: 5.5 10*3/uL (ref 1.4–6.5)

## 2016-09-25 LAB — RAPID HIV SCREEN (HIV 1/2 AB+AG)
HIV 1/2 Antibodies: NONREACTIVE
HIV-1 P24 Antigen - HIV24: NONREACTIVE

## 2016-09-25 LAB — CBC
HCT: 33.4 % — ABNORMAL LOW (ref 35.0–47.0)
HEMOGLOBIN: 10.9 g/dL — AB (ref 12.0–16.0)
MCH: 25.8 pg — AB (ref 26.0–34.0)
MCHC: 32.6 g/dL (ref 32.0–36.0)
MCV: 79.3 fL — ABNORMAL LOW (ref 80.0–100.0)
PLATELETS: 197 10*3/uL (ref 150–440)
RBC: 4.22 MIL/uL (ref 3.80–5.20)
RDW: 15.4 % — ABNORMAL HIGH (ref 11.5–14.5)
WBC: 7.5 10*3/uL (ref 3.6–11.0)

## 2016-09-25 LAB — TYPE AND SCREEN
ABO/RH(D): O POS
ANTIBODY SCREEN: NEGATIVE
Extend sample reason: UNDETERMINED

## 2016-09-25 NOTE — Telephone Encounter (Signed)
FMLA/DISABILITY form for [employer] for spouse filled out and given to [place] for processing.

## 2016-09-25 NOTE — Patient Instructions (Addendum)
  Your procedure is scheduled on: Tomorrow September 26, 2016. Report to Emergency Room at  0645 am.   Remember: Instructions that are not followed completely may result in serious medical risk, up to and including death, or upon the discretion of your surgeon and anesthesiologist your surgery may need to be rescheduled.    _x___ 1. Do not eat food or drink liquids after midnight. No gum chewing or hard candies.     ____ 2. No Alcohol for 24 hours before or after surgery.   ____ 3. Bring all medications with you on the day of surgery if instructed.    __x__ 4. Notify your doctor if there is any change in your medical condition     (cold, fever, infections).    _____ 5. No smoking 24 hours prior to surgery.     Do not wear jewelry, make-up, hairpins, clips or nail polish.  Do not wear lotions, powders, or perfumes.   Do not shave 48 hours prior to surgery. Men may shave face and neck.  Do not bring valuables to the hospital.    [facility] is not responsible for any belongings or valuables.               Contacts, dentures or bridgework may not be worn into surgery.  Leave your suitcase in the car. After surgery it may be brought to your room.  For patients admitted to the hospital, discharge time is determined by your treatment team.   Patients discharged the day of surgery will not be allowed to drive home.    Please read over the following fact sheets that you were given:   [facility] Preparing for Surgery  ____ Take these medicines the morning of surgery with A SIP OF WATER: none    ____ Fleet Enema (as directed)   __x__ Use CHG Soap as directed on instruction sheet  ____ Use inhalers on the day of surgery and bring to hospital day of surgery  ____ Stop metformin 2 days prior to surgery    ____ Take 1/2 of usual insulin dose the night before surgery and none on the morning of surgery.   ____ Stop Coumadin/Plavix/aspirin on does not apply.  _x__ Stop Anti-inflammatories  such as Advil, Aleve, Ibuprofen, Motrin, Naproxen,  Naprosyn, Goodies powders or aspirin  products. OK to take Tylenol.   ____ Stop supplements until after surgery.    ____ Bring C-Pap to the hospital.

## 2016-09-26 ENCOUNTER — Inpatient Hospital Stay: Payer: BLUE CROSS/BLUE SHIELD | Admitting: Anesthesiology

## 2016-09-26 ENCOUNTER — Encounter: Payer: Self-pay | Admitting: Anesthesiology

## 2016-09-26 ENCOUNTER — Encounter
Admission: RE | Disposition: A | Discharge status: Home / Self Care | Payer: Self-pay | Source: Ambulatory Visit | Attending: Obstetrics and Gynecology

## 2016-09-26 ENCOUNTER — Inpatient Hospital Stay
Admission: RE | Admit: 2016-09-26 | Discharge: 2016-09-29 | DRG: 765 | Disposition: A | Discharge status: Home / Self Care | Payer: BLUE CROSS/BLUE SHIELD | Source: Ambulatory Visit | Attending: Obstetrics and Gynecology | Admitting: Obstetrics and Gynecology

## 2016-09-26 DIAGNOSIS — O9081 Anemia of the puerperium: Secondary | ICD-10-CM | POA: Diagnosis not present

## 2016-09-26 DIAGNOSIS — Z302 Encounter for sterilization: Secondary | ICD-10-CM

## 2016-09-26 DIAGNOSIS — Z3A39 39 weeks gestation of pregnancy: Secondary | ICD-10-CM

## 2016-09-26 DIAGNOSIS — O34211 Maternal care for low transverse scar from previous cesarean delivery: Secondary | ICD-10-CM | POA: Diagnosis present

## 2016-09-26 DIAGNOSIS — Z6830 Body mass index (BMI) 30.0-30.9, adult: Secondary | ICD-10-CM | POA: Diagnosis not present

## 2016-09-26 DIAGNOSIS — Z98891 History of uterine scar from previous surgery: Secondary | ICD-10-CM

## 2016-09-26 DIAGNOSIS — O0993 Supervision of high risk pregnancy, unspecified, third trimester: Secondary | ICD-10-CM

## 2016-09-26 DIAGNOSIS — D62 Acute posthemorrhagic anemia: Secondary | ICD-10-CM | POA: Diagnosis not present

## 2016-09-26 DIAGNOSIS — E669 Obesity, unspecified: Secondary | ICD-10-CM | POA: Diagnosis present

## 2016-09-26 DIAGNOSIS — O99214 Obesity complicating childbirth: Secondary | ICD-10-CM | POA: Diagnosis present

## 2016-09-26 HISTORY — PX: TUBAL LIGATION: SHX77

## 2016-09-26 SURGERY — Surgical Case
Anesthesia: Spinal | Site: Abdomen | Wound class: Clean Contaminated

## 2016-09-26 MED ORDER — OXYCODONE-ACETAMINOPHEN 5-325 MG PO TABS
2.0000 | ORAL_TABLET | ORAL | Status: DC | PRN
  Administered 2016-09-26: 1 via ORAL
  Administered 2016-09-27: 2 via ORAL
  Filled 2016-09-26 (×3): qty 2

## 2016-09-26 MED ORDER — SIMETHICONE 80 MG PO CHEW
80.0000 mg | CHEWABLE_TABLET | Freq: Three times a day (TID) | ORAL | Status: DC
  Administered 2016-09-26 – 2016-09-29 (×8): 80 mg via ORAL
  Filled 2016-09-26 (×8): qty 1

## 2016-09-26 MED ORDER — BUPIVACAINE HCL 0.5 % IJ SOLN
INTRAMUSCULAR | Status: DC | PRN
  Administered 2016-09-26: 10 mL

## 2016-09-26 MED ORDER — OXYTOCIN 40 UNITS IN LACTATED RINGERS INFUSION - SIMPLE MED
INTRAVENOUS | Status: AC
  Administered 2016-09-26: 11:00:00
  Filled 2016-09-26: qty 1000

## 2016-09-26 MED ORDER — BUPIVACAINE HCL (PF) 0.5 % IJ SOLN
INTRAMUSCULAR | Status: DC | PRN
  Administered 2016-09-26: 1.8 mL

## 2016-09-26 MED ORDER — ONDANSETRON HCL 4 MG/2ML IJ SOLN: 4.0000 mg | Freq: Once | INTRAMUSCULAR | Status: DC | PRN

## 2016-09-26 MED ORDER — DIPHENHYDRAMINE HCL 25 MG PO CAPS: 25.0000 mg | ORAL_CAPSULE | Freq: Four times a day (QID) | ORAL | Status: DC | PRN

## 2016-09-26 MED ORDER — SENNOSIDES-DOCUSATE SODIUM 8.6-50 MG PO TABS
2.0000 | ORAL_TABLET | ORAL | Status: DC
  Administered 2016-09-27 – 2016-09-29 (×3): 2 via ORAL
  Filled 2016-09-26 (×3): qty 2

## 2016-09-26 MED ORDER — ONDANSETRON HCL 4 MG/2ML IJ SOLN
INTRAMUSCULAR | Status: DC | PRN
  Administered 2016-09-26: 4 mg via INTRAVENOUS

## 2016-09-26 MED ORDER — LACTATED RINGERS IV SOLN
INTRAVENOUS | Status: DC
  Administered 2016-09-26: 09:00:00 via INTRAVENOUS

## 2016-09-26 MED ORDER — COCONUT OIL OIL: 1.0000 "application " | TOPICAL_OIL | Status: DC | PRN

## 2016-09-26 MED ORDER — MORPHINE SULFATE (PF) 0.5 MG/ML IJ SOLN
INTRAMUSCULAR | Status: AC
  Filled 2016-09-26: qty 10

## 2016-09-26 MED ORDER — MENTHOL 3 MG MT LOZG
1.0000 | LOZENGE | OROMUCOSAL | Status: DC | PRN
  Filled 2016-09-26: qty 9

## 2016-09-26 MED ORDER — OXYTOCIN 10 UNIT/ML IJ SOLN
INTRAVENOUS | Status: DC | PRN
  Administered 2016-09-26: 40 [IU] via INTRAVENOUS

## 2016-09-26 MED ORDER — BUPIVACAINE HCL (PF) 0.5 % IJ SOLN
INTRAMUSCULAR | Status: AC
  Filled 2016-09-26: qty 10

## 2016-09-26 MED ORDER — DIBUCAINE 1 % RE OINT: 1.0000 "application " | TOPICAL_OINTMENT | RECTAL | Status: DC | PRN

## 2016-09-26 MED ORDER — OXYCODONE-ACETAMINOPHEN 5-325 MG PO TABS
1.0000 | ORAL_TABLET | ORAL | Status: DC | PRN
  Administered 2016-09-28 – 2016-09-29 (×4): 1 via ORAL
  Filled 2016-09-26 (×4): qty 1

## 2016-09-26 MED ORDER — DEXAMETHASONE SODIUM PHOSPHATE 10 MG/ML IJ SOLN
INTRAMUSCULAR | Status: AC
  Filled 2016-09-26: qty 1

## 2016-09-26 MED ORDER — SOD CITRATE-CITRIC ACID 500-334 MG/5ML PO SOLN
ORAL | Status: AC
  Administered 2016-09-26: 30 mL
  Filled 2016-09-26: qty 15

## 2016-09-26 MED ORDER — KETOROLAC TROMETHAMINE 15 MG/ML IJ SOLN
15.0000 mg | Freq: Four times a day (QID) | INTRAMUSCULAR | Status: AC
  Administered 2016-09-26 – 2016-09-27 (×4): 15 mg via INTRAVENOUS
  Filled 2016-09-26 (×5): qty 1

## 2016-09-26 MED ORDER — MEPERIDINE HCL 25 MG/ML IJ SOLN: 25.0000 mg | Freq: Once | INTRAMUSCULAR | Status: DC

## 2016-09-26 MED ORDER — LIDOCAINE HCL (PF) 2 % IJ SOLN
INTRAMUSCULAR | Status: AC
  Filled 2016-09-26: qty 2

## 2016-09-26 MED ORDER — WITCH HAZEL-GLYCERIN EX PADS: 1.0000 "application " | MEDICATED_PAD | CUTANEOUS | Status: DC | PRN

## 2016-09-26 MED ORDER — KETOROLAC TROMETHAMINE 30 MG/ML IJ SOLN
30.0000 mg | Freq: Once | INTRAMUSCULAR | Status: DC | PRN
  Administered 2016-09-26: 30 mg via INTRAVENOUS

## 2016-09-26 MED ORDER — LACTATED RINGERS IV SOLN
INTRAVENOUS | Status: DC
  Administered 2016-09-27: 03:00:00 via INTRAVENOUS

## 2016-09-26 MED ORDER — CEFOXITIN SODIUM-DEXTROSE 2-2.2 GM-% IV SOLR (PREMIX)
INTRAVENOUS | Status: AC
Start: 2016-09-26 — End: 2016-09-26
  Filled 2016-09-26: qty 50

## 2016-09-26 MED ORDER — KETOROLAC TROMETHAMINE 30 MG/ML IJ SOLN
INTRAMUSCULAR | Status: AC
  Filled 2016-09-26: qty 1

## 2016-09-26 MED ORDER — ONDANSETRON HCL 4 MG/2ML IJ SOLN
INTRAMUSCULAR | Status: AC
  Filled 2016-09-26: qty 2

## 2016-09-26 MED ORDER — PHENYLEPHRINE HCL 10 MG/ML IJ SOLN
INTRAMUSCULAR | Status: AC
  Filled 2016-09-26: qty 1

## 2016-09-26 MED ORDER — PRENATAL MULTIVITAMIN CH
1.0000 | ORAL_TABLET | Freq: Every day | ORAL | Status: DC
  Administered 2016-09-27 – 2016-09-29 (×3): 1 via ORAL
  Filled 2016-09-26 (×3): qty 1

## 2016-09-26 MED ORDER — CEFOXITIN SODIUM-DEXTROSE 2-2.2 GM-% IV SOLR (PREMIX)
2.0000 g | Freq: Once | INTRAVENOUS | Status: AC
  Administered 2016-09-26: 2 g via INTRAVENOUS

## 2016-09-26 MED ORDER — PHENYLEPHRINE HCL 10 MG/ML IJ SOLN
INTRAMUSCULAR | Status: DC | PRN
  Administered 2016-09-26: 100 ug via INTRAVENOUS
  Administered 2016-09-26: 200 ug via INTRAVENOUS
  Administered 2016-09-26 (×3): 100 ug via INTRAVENOUS

## 2016-09-26 MED ORDER — MEPERIDINE HCL 25 MG/ML IJ SOLN
INTRAMUSCULAR | Status: AC
  Filled 2016-09-26: qty 1

## 2016-09-26 MED ORDER — BUPIVACAINE 0.25 % ON-Q PUMP DUAL CATH 400 ML
400.0000 mL | INJECTION | Status: DC
  Filled 2016-09-26: qty 400

## 2016-09-26 MED ORDER — BUPIVACAINE HCL (PF) 0.5 % IJ SOLN
INTRAMUSCULAR | Status: AC
  Filled 2016-09-26: qty 30

## 2016-09-26 MED ORDER — OXYTOCIN 40 UNITS IN LACTATED RINGERS INFUSION - SIMPLE MED
2.5000 [IU]/h | INTRAVENOUS | Status: AC
  Filled 2016-09-26: qty 1000

## 2016-09-26 MED ORDER — LIDOCAINE HCL (PF) 1 % IJ SOLN
INTRAMUSCULAR | Status: AC
  Filled 2016-09-26: qty 30

## 2016-09-26 MED ORDER — LACTATED RINGERS IV SOLN
INTRAVENOUS | Status: DC
  Administered 2016-09-26: 999 mL via INTRAVENOUS
  Administered 2016-09-26: 09:00:00 via INTRAVENOUS

## 2016-09-26 MED ORDER — IBUPROFEN 600 MG PO TABS: 600.0000 mg | ORAL_TABLET | Freq: Four times a day (QID) | ORAL | Status: DC

## 2016-09-26 MED ORDER — FERROUS SULFATE 325 (65 FE) MG PO TABS
325.0000 mg | ORAL_TABLET | Freq: Two times a day (BID) | ORAL | Status: DC
  Administered 2016-09-26 – 2016-09-27 (×2): 325 mg via ORAL
  Filled 2016-09-26 (×2): qty 1

## 2016-09-26 MED ORDER — PROPOFOL 10 MG/ML IV BOLUS
INTRAVENOUS | Status: AC
  Filled 2016-09-26: qty 20

## 2016-09-26 MED ORDER — HYDROMORPHONE HCL 1 MG/ML IJ SOLN
0.5000 mg | INTRAMUSCULAR | Status: DC | PRN
  Administered 2016-09-26: 0.5 mg via INTRAVENOUS
  Filled 2016-09-26: qty 1

## 2016-09-26 MED ORDER — DEXAMETHASONE SODIUM PHOSPHATE 10 MG/ML IJ SOLN
INTRAMUSCULAR | Status: DC | PRN
  Administered 2016-09-26: 8 mg via INTRAVENOUS

## 2016-09-26 MED ORDER — FENTANYL CITRATE (PF) 100 MCG/2ML IJ SOLN
25.0000 ug | INTRAMUSCULAR | Status: DC | PRN
  Administered 2016-09-26: 25 ug via INTRAVENOUS
  Filled 2016-09-26: qty 2

## 2016-09-26 MED ORDER — BUPIVACAINE IN DEXTROSE 0.75-8.25 % IT SOLN
INTRATHECAL | Status: AC
  Filled 2016-09-26: qty 2

## 2016-09-26 SURGICAL SUPPLY — 28 items
BAG COUNTER SPONGE EZ (MISCELLANEOUS) ×6 IMPLANT
CANISTER SUCT 3000ML PPV (MISCELLANEOUS) ×4 IMPLANT
CATH KIT ON-Q SILVERSOAK 5IN (CATHETERS) ×8 IMPLANT
CHLORAPREP W/TINT 26ML (MISCELLANEOUS) ×8 IMPLANT
CLOSURE WOUND 1/2 X4 (GAUZE/BANDAGES/DRESSINGS)
COUNTER SPONGE BAG EZ (MISCELLANEOUS) ×2
DERMABOND ADVANCED (GAUZE/BANDAGES/DRESSINGS) ×2
DERMABOND ADVANCED .7 DNX12 (GAUZE/BANDAGES/DRESSINGS) ×2 IMPLANT
DRSG OPSITE POSTOP 4X10 (GAUZE/BANDAGES/DRESSINGS) IMPLANT
DRSG TELFA 3X8 NADH (GAUZE/BANDAGES/DRESSINGS) IMPLANT
ELECT CAUTERY BLADE 6.4 (BLADE) ×4 IMPLANT
ELECT REM PT RETURN 9FT ADLT (ELECTROSURGICAL) ×4
ELECTRODE REM PT RTRN 9FT ADLT (ELECTROSURGICAL) ×2 IMPLANT
GAUZE SPONGE 4X4 12PLY STRL (GAUZE/BANDAGES/DRESSINGS) IMPLANT
GLOVE BIO SURGEON STRL SZ7 (GLOVE) ×12 IMPLANT
GLOVE INDICATOR 7.5 STRL GRN (GLOVE) ×12 IMPLANT
GOWN STRL REUS W/ TWL LRG LVL3 (GOWN DISPOSABLE) ×6 IMPLANT
GOWN STRL REUS W/TWL LRG LVL3 (GOWN DISPOSABLE) ×6
NS IRRIG 1000ML POUR BTL (IV SOLUTION) ×4 IMPLANT
PACK C SECTION AR (MISCELLANEOUS) ×4 IMPLANT
PAD OB MATERNITY 4.3X12.25 (PERSONAL CARE ITEMS) ×4 IMPLANT
PAD PREP 24X41 OB/GYN DISP (PERSONAL CARE ITEMS) ×4 IMPLANT
STRIP CLOSURE SKIN 1/2X4 (GAUZE/BANDAGES/DRESSINGS) IMPLANT
SUT MNCRL AB 4-0 PS2 18 (SUTURE) ×4 IMPLANT
SUT PDS AB 1 TP1 96 (SUTURE) ×8 IMPLANT
SUT VIC AB 0 CTX 36 (SUTURE) ×6
SUT VIC AB 0 CTX36XBRD ANBCTRL (SUTURE) ×6 IMPLANT
SUT VIC AB 2-0 CT1 36 (SUTURE) ×8 IMPLANT

## 2016-09-26 NOTE — Anesthesia Procedure Notes (Signed)
Spinal

## 2016-09-26 NOTE — H&P (Signed)
Obstetric H&P   Chief Complaint: Scheduled C-section  Prenatal Care Provider: [facility]  History of Present Illness: 26 y.o. [id number] [redacted]w[redacted]d by 09/29/2016, by LMP=10 week Ultrasound presenting to L&D for scheduled repeat C-section.  +FM, no LOF, no VB, no ctx  Pregravid weight 176 lb (79.8 kg) Total Weight Gain 20 lb (9.072 kg)  pregnancy #4 Problems (from 12/24/15 to present)    Problem Noted Resolved   Supervision of high risk pregnancy, antepartum, third trimester 05/22/2016 by Will Bonnet, MD No   Priority:  High     Overview Addendum 09/26/2016  8:01 AM by Malachy Mood, MD    Clinic [facility]  Dating LMP=10 week [place]  Genetic Screen 1 Screen: neg   AFP:     Quad:     NIPS:  Anatomic [place]   GTT Third trimester: 11  Rhogam N/A  TDaP vaccine Needs postpartum  Baby Food                                 Contraception BTL   Prenatal Labs  Blood type: --/--/O POS (06/26 1032)   Antibody:NEG (06/26 1032)  Rubella:   Varicella: I  RPR: Non Reactive (06/26 1032)   HBsAg:     HIV: Non Reactive (06/26 1032)   OXB:[id number]  Pap:               Supervision of high risk pregnancy, antepartum, third trimester 05/22/2016 by Will Bonnet, MD 07/21/2016 by Will Bonnet, MD   BMI 30.0-30.9,adult 05/22/2016 by Will Bonnet, MD 09/26/2016 by Malachy Mood, MD       Review of Systems: 10 point review of systems negative unless otherwise noted in HPI  Past Medical History: Past Medical History:  Diagnosis Date  . Anemia   . H/O: cesarean section    FTP and NRFHT  . PONV (postoperative nausea and vomiting)    nausea and vomiting after 2nd c-section  . Seizures ([place])    as a child, none recently.    Past Surgical History: Past Surgical History:  Procedure Laterality Date  . CESAREAN SECTION  01/23/2014  . CESAREAN SECTION N/A 08/10/2015   Procedure: CESAREAN SECTION;  Surgeon: Malachy Mood, MD;  Location: [facility] ORS;  Service: Obstetrics;  Laterality: N/A;  .  THERAPEUTIC ABORTION    . WISDOM TOOTH EXTRACTION      Past Obstetric History:  Past Gynecologic History:  Family History: Family History  Problem Relation Age of Onset  . Cancer Paternal Grandfather        colon ca  . Hypertension Paternal Grandmother   . Diabetes Paternal Grandmother     Social History: Social History   Social History  . Marital status: Married    Spouse name: N/A  . Number of children: N/A  . Years of education: N/A   Occupational History  . Not on file.   Social History Main Topics  . Smoking status: Never Smoker  . Smokeless tobacco: Never Used  . Alcohol use No     Comment: does not drink alcohol while pregnant  . Drug use: No  . Sexual activity: Yes    Birth control/ protection: Surgical     Comment: pregnant   Other Topics Concern  . Not on file   Social History Narrative  . No narrative on file    Medications: Prior to Admission medications   Medication Sig Start Date End Date  Taking? Authorizing Provider  metoCLOPramide (REGLAN) 10 MG tablet Take 1 tablet (10 mg total) by mouth 3 (three) times daily before meals. 09/05/16  Yes Malachy Mood, MD  ondansetron (ZOFRAN-ODT) 4 MG disintegrating tablet Take 4 mg by mouth 2 (two) times daily as needed for nausea or vomiting.   Yes [provider]  Prenatal Multivit-Min-Fe-FA (PRENATAL VITAMINS PO) Take 1 tablet by mouth daily.   Yes [provider]    Allergies: Allergies  Allergen Reactions  . Sulfa Antibiotics Hives    Physical Exam: Vitals: Blood pressure (!) 120/57, pulse 90, temperature 97.8 F (36.6 C), temperature source Oral, resp. rate 18, height 5\' 4"  (1.626 m), weight 196 lb (88.9 kg), last menstrual period 12/24/2015, unknown if currently breastfeeding.  Urine Dip Protein: N/A  FHT: 130, moderate, +accels, no decels Toco: none  General: NAD HEENT: normocephalic, anicteric Pulmonary: No increased work of breathing Cardiovascular: RRR, distal  pulses 2+ Abdomen: Gravid, non-tender Leopolds: Genitourinary: deferred Extremities: no edema, erythema, or tenderness Neurologic: Grossly intact Psychiatric: mood appropriate, affect full  Labs: Results for orders placed or performed during the hospital encounter of 09/25/16 (from the past 24 hour(s))  CBC     Status: Abnormal   Collection Time: 09/25/16  1:15 PM  Result Value Ref Range   WBC 7.5 3.6 - 11.0 K/uL   RBC 4.22 3.80 - 5.20 MIL/uL   Hemoglobin 10.9 (L) 12.0 - 16.0 g/dL   HCT 33.4 (L) 35.0 - 47.0 %   MCV 79.3 (L) 80.0 - 100.0 fL   MCH 25.8 (L) 26.0 - 34.0 pg   MCHC 32.6 32.0 - 36.0 g/dL   RDW 15.4 (H) 11.5 - 14.5 %   Platelets 197 150 - 440 K/uL  Differential     Status: None   Collection Time: 09/25/16  1:15 PM  Result Value Ref Range   Neutrophils Relative % 74 %   Neutro Abs 5.5 1.4 - 6.5 K/uL   Lymphocytes Relative 19 %   Lymphs Abs 1.4 1.0 - 3.6 K/uL   Monocytes Relative 7 %   Monocytes Absolute 0.5 0.2 - 0.9 K/uL   Eosinophils Relative 0 %   Eosinophils Absolute 0.0 0 - 0.7 K/uL   Basophils Relative 0 %   Basophils Absolute 0.0 0 - 0.1 K/uL  Type and screen [facility]     Status: None   Collection Time: 09/25/16  1:15 PM  Result Value Ref Range   ABO/RH(D) O POS    Antibody Screen NEG    Sample Expiration 09/28/2016    Extend sample reason PREGNANT WITHIN 3 MONTHS, UNABLE TO EXTEND   Rapid HIV screen (HIV 1/2 Ab+Ag)     Status: None   Collection Time: 09/25/16  1:15 PM  Result Value Ref Range   HIV-1 P24 Antigen - HIV24 NON REACTIVE NON REACTIVE   HIV 1/2 Antibodies NON REACTIVE NON REACTIVE   Interpretation (HIV Ag Ab)      A non reactive test result means that HIV 1 or HIV 2 antibodies and HIV 1 p24 antigen were not detected in the specimen.    Assessment: 26 y.o. [id number] [redacted]w[redacted]d by 09/29/2016, by LMP=Ultrasound 10 week [place] repeat C-section and BTL  Plan: 1) Proceed with repeat C-section and BTL 26 y.o. [id number]  with  undesired fertility, desires permanent sterilization.  Other reversible forms of contraception were discussed with patient; she declines all other modalities. Permanent nature of as well as associated risks of the procedure discussed  with patient including but not limited to: risk of regret, permanence of method, bleeding, infection, injury to surrounding organs and need for additional procedures.  Failure risk of 0.5-1% with increased risk of ectopic gestation if pregnancy occurs was also discussed with patient.    The patient was counseled regarding risk and benefits to proceeding with Cesarean section to expedite delivery.  Risk of cesarean section were discussed including risk of bleeding and need for potential intraoperative or postoperative blood transfusion with a rate of approximately 5% quoted for all Cesarean sections, risk of injury to adjacent organs including but not limited to bowl and bladder, the need for additional surgical procedures to address such injuries, and the risk of infection.   - ancef 2g preop - On-q pump  2) Fetus - cat I tracing  3) PNL - see above  4) Immunization History -  Immunization History  Administered Date(s) Administered  . HPV Quadrivalent 01/29/2008  . Td 01/29/2008  - needs TDaP prior to discharge  5) Disposition -  Pending delivery

## 2016-09-26 NOTE — Transfer of Care (Signed)
Immediate Anesthesia Transfer of Care Note  Patient: Brenda Wang  Procedure(s) Performed: Procedure(s): CESAREAN SECTION WITH BILATERAL TUBAL LIGATION (Bilateral)  Patient Location: PACU  Anesthesia Type:Spinal  Level of Consciousness: awake  Airway & Oxygen Therapy: Patient Spontanous Breathing  Post-op Assessment: Report given to RN and Post -op Vital signs reviewed and stable  Post vital signs: Reviewed and stable  Last Vitals: 116/75 61 100% 14 Vitals:   09/26/16 0732  BP: (!) 120/57  Pulse: 90  Resp: 18  Temp: 36.6 C    Last Pain:  Vitals:   09/26/16 0735  TempSrc:   PainSc: 0-No pain         Complications: No apparent anesthesia complications

## 2016-09-26 NOTE — Anesthesia Procedure Notes (Addendum)
Spinal  Start time: 09/26/2016 8:53 AM End time: 09/26/2016 9:06 AM Staffing Anesthesiologist: Alvin Critchley Resident/CRNA: Batoul Limes Performed: resident/CRNA  Preanesthetic Checklist Completed: patient identified, site marked, surgical consent, pre-op evaluation, timeout performed, IV checked, risks and benefits discussed and monitors and equipment checked Spinal Block Patient position: sitting Prep: ChloraPrep Patient monitoring: heart rate, continuous pulse ox and blood pressure Approach: midline Location: L4-5 Injection technique: single-shot Needle Needle type: Pencan  Needle gauge: 25 G Needle length: 9 cm Catheter type: closed end flexible Additional Notes 1-2 attempts at L4-5. Unsuccessful.  Dr Kayleen Memos took over with success on 1st attempt At L3-4.  1.4ml of 0.5% Bupivacaine (8 MG) injected with Epi wash.  Laid flat.  Able to move legs, inadequate block.  Patient agrees to second SAB

## 2016-09-26 NOTE — Anesthesia Post-op Follow-up Note (Signed)
Anesthesia QCDR form completed.        

## 2016-09-26 NOTE — Anesthesia Preprocedure Evaluation (Signed)
Anesthesia Evaluation  Patient identified by MRN, date of birth, ID band Patient awake    Reviewed: Allergy & Precautions, NPO status , Patient's Chart, lab work & pertinent test results  History of Anesthesia Complications (+) PONV and history of anesthetic complications  Airway Mallampati: II  TM Distance: >3 FB     Dental no notable dental hx.    Pulmonary neg pulmonary ROS,    Pulmonary exam normal        Cardiovascular negative cardio ROS Normal cardiovascular exam     Neuro/Psych  Headaches, Seizures as a child negative psych ROS   GI/Hepatic negative GI ROS, Neg liver ROS,   Endo/Other  negative endocrine ROS  Renal/GU negative Renal ROS  negative genitourinary   Musculoskeletal negative musculoskeletal ROS (+)   Abdominal Normal abdominal exam  (+)   Peds negative pediatric ROS (+)  Hematology  (+) anemia ,   Anesthesia Other Findings Seizures as a child Anemia Vit B12 deficiency Prior C-section  Reproductive/Obstetrics (+) Pregnancy                             Anesthesia Physical  Anesthesia Plan  ASA: II  Anesthesia Plan: Spinal   Post-op Pain Management:    Induction: Intravenous  PONV Risk Score and Plan:   Airway Management Planned: Nasal Cannula  Additional Equipment:   Intra-op Plan:   Post-operative Plan:   Informed Consent: I have reviewed the patients History and Physical, chart, labs and discussed the procedure including the risks, benefits and alternatives for the proposed anesthesia with the patient or authorized representative who has indicated his/her understanding and acceptance.   Dental advisory given  Plan Discussed with: CRNA and Surgeon  Anesthesia Plan Comments: (Will add astromorph for post op pain relief per request of surgeon)        Anesthesia Quick Evaluation

## 2016-09-26 NOTE — Anesthesia Procedure Notes (Signed)
Date/Time: 09/26/2016 8:50 AM Performed by: Allean Found Pre-anesthesia Checklist: Patient identified, Emergency Drugs available, Suction available, Patient being monitored and Timeout performed Patient Re-evaluated:Patient Re-evaluated prior to induction Oxygen Delivery Method: Nasal cannula Placement Confirmation: positive ETCO2

## 2016-09-26 NOTE — Op Note (Signed)
Preoperative Diagnosis: 1) 26 y.o. [id number] at [redacted]w[redacted]d 2) Desires permanent surgical sterilization 3) History of prior Cesarean section  Postoperative Diagnosis: 1) 26 y.o. [id number] at [redacted]w[redacted]d 2) Desires permanent surgical sterilization 3) History of prior Cesarean section  Operation Performed: Repeat low transverse C-section via pfannenstiel skin incision and Pomeroy tubal ligation  Anesthesia: Spinal  Primary Surgeon: Malachy Mood, MD  Assistant: Prentice Docker, MD  Preoperative Antibiotics: 2g ancef  Estimated Blood Loss: 973mL  IV Fluids: 2L  Urine Output:: 236mL  Drains or Tubes: Foley to gravity drainage, ON-Q catheter system  Implants: none  Specimens Removed: none  Complications: none  Intraoperative Findings:  Normal tubes ovaries and uterus.  Delivery resulted in the birth of a liveborn female, APGAR (1 MIN): 8  APGAR (5 MINS): 9, weight pending  Patient Condition: stable  Procedure in Detail:  Patient was taken to the operating room were she was administered regional anesthesia.  She was positioned in the supine position, prepped and draped in the  Usual sterile fashion.  Prior to proceeding with the case a time out was performed and the level of anesthetic was checked and noted to be adequate.  Utilizing the scalpel a pfannenstiel skin incision was made 2cm above the pubic symphysis utilizing the patient's pre-existing scar and carried down sharply to the the level of the rectus fascia.  The fascia was incised in the midline using the scalpel and then extended using mayo scissors.  The superior border of the rectus fascia was grasped with two Kocher clamps and the underlying rectus muscles were dissected of the fascia using blunt dissection.  The median raphae was incised using Mayo scissors.   The inferior border of the rectus fascia was dissected of the rectus muscles in a similar fashion.  The midline was identified, the peritoneum was entered bluntly and expanded  using manual tractions.  The uterus was noted to be in a none rotated position.  Next the bladder blade was placed retracting the bladder caudally.  A bladder flap was  created.  The bladder reflection was grasped with a pickup, and Metzenbaum scissors were then used the undermine the bladder reflection.  The bladder flap was developed using digital dissection.  The bladder blade was replaced retracting the bladder caudally out of the operative field.  A low transverse incision was scored on the lower uterine segment.  The hysterotomy was entered bluntly using the operators finger.  The hysterotomy incision was extended using manual traction.  The operators hand was placed within the hysterotomy position noting the fetus to be within the OA position.  The vertex was grasped, flexed, brought to the incision, and delivered a traumatically using fundal pressure.  The remainder of the body delivered with ease.  The infant was suctioned, cord was clamped and cut before handing off to the awaiting neonatologist.  The placenta was delivered using manual extraction.  The uterus was exteriorized, wiped clean of clots and debris using two moist laps.  The hysterotomy was closed using a two layer closure of 0 Vicryl, with the first being a running locked, the second a vertical imbricating.  The right tube was grasped in a mid isthmic portion using a Babcock clamp, before being double suture ligated using a 0 chromic wheel.  The intervening nuckel of tube was excised using Metzenbaum scissors.  Complete cross section of tubal ostia visualized, and noted to be hemostatic  This procedure was then repeated in similar fashion for the patient left tube.  The  right tie did slip and the mesosalpinx was over sown using 2-0 Vicryl in a running locked fashion.  The uterus was returned to the abdomen.  The peritoneal gutters were wiped clean of clots and debris using two moist laps.  The hysterotomy incision and tubal pedicles were  re-inspected noted to be hemostatic.  The rectus muscles were re-approximated in the midline using a single 2-0 Vicryl mattress stitch.  The rectus muscles were inspected noted to be hemostatic.  The superior border of the rectus fascia was grasped with a Kocher clamp.  The ON-Q trocars were then placed 4cm above the superior border of the incision and tunneled subfascially.  The introducers were removed and the catheters were threaded through the sleeves after which the sleeves were removed.  The fascia was closed using a looped #1 PDS in a running fashion taking 1cm by 1cm bites.  The subcutaneous tissue was irrigated using warm saline, hemostasis achieved using the bovie.  The subcutaneous dead space was less than 3cm and was not closed.  The skin was closed using staples.  Sponge needle and instrument counts were corrects times two.  The patient tolerated the procedure well and was taken to the recovery room in stable condition.

## 2016-09-26 NOTE — Progress Notes (Signed)
Spoke to Dr. Kayleen Memos about back pain 9/10 and low temperature (unable to assess temperature oral or axillary). MD will put in orders for Ketoralac and Fentanyl (see MAR).

## 2016-09-26 NOTE — Progress Notes (Signed)
Dr. Kayleen Memos at bedside to assess pt.

## 2016-09-27 LAB — CBC
HCT: 30.8 % — ABNORMAL LOW (ref 35.0–47.0)
HEMOGLOBIN: 9.9 g/dL — AB (ref 12.0–16.0)
MCH: 25.6 pg — AB (ref 26.0–34.0)
MCHC: 32.1 g/dL (ref 32.0–36.0)
MCV: 79.6 fL — AB (ref 80.0–100.0)
Platelets: 181 10*3/uL (ref 150–440)
RBC: 3.87 MIL/uL (ref 3.80–5.20)
RDW: 15 % — ABNORMAL HIGH (ref 11.5–14.5)
WBC: 13.5 10*3/uL — ABNORMAL HIGH (ref 3.6–11.0)

## 2016-09-27 LAB — SURGICAL PATHOLOGY

## 2016-09-27 MED ORDER — FERROUS SULFATE 325 (65 FE) MG PO TABS
325.0000 mg | ORAL_TABLET | Freq: Every day | ORAL | Status: DC
  Administered 2016-09-28 – 2016-09-29 (×2): 325 mg via ORAL
  Filled 2016-09-27 (×2): qty 1

## 2016-09-27 MED ORDER — IBUPROFEN 600 MG PO TABS
600.0000 mg | ORAL_TABLET | Freq: Four times a day (QID) | ORAL | Status: DC
  Administered 2016-09-27 – 2016-09-29 (×8): 600 mg via ORAL
  Filled 2016-09-27 (×8): qty 1

## 2016-09-27 NOTE — Anesthesia Postprocedure Evaluation (Signed)
Anesthesia Post Note  Patient: Brenda Wang  Procedure(s) Performed: Procedure(s) (LRB): CESAREAN SECTION WITH BILATERAL TUBAL LIGATION (Bilateral)  Patient location during evaluation: [facility] Anesthesia Type: Spinal Level of consciousness: awake, awake and alert and oriented Pain management: pain level controlled Vital Signs Assessment: post-procedure vital signs reviewed and stable Respiratory status: spontaneous breathing, nonlabored ventilation and respiratory function stable Cardiovascular status: blood pressure returned to baseline and stable Postop Assessment: no headache and no backache Anesthetic complications: no     Last Vitals:  Vitals:   09/26/16 2314 09/27/16 0318  BP: (!) 121/53 117/68  Pulse: 75 69  Resp:  18  Temp: 36.6 C 36.8 C  SpO2:  98%    Last Pain:  Vitals:   09/27/16 0318  TempSrc: Oral  PainSc:                  Johnna Acosta

## 2016-09-27 NOTE — Progress Notes (Signed)
POD #1 repeat CS and BTL Subjective:   Doing well. Foley discontinued. Has not voided. Tolerating regular diet. Bottle feeding  Objective:  Blood pressure 107/60, pulse 64, temperature (!) 97.5 F (36.4 C), temperature source Oral, resp. rate 18, height 5\' 4"  (1.626 m), weight 88.9 kg (196 lb), last menstrual period 12/24/2015, SpO2 100 %, unknown if currently breastfeeding. Intake 4525 and urine output 1300 with 1057ml  EBL General: pleasant female in NAD Pulmonary: no increased work of breathing, CTAB Heart: RRR without murmur Abdomen: non-distended, non-tender, fundus firm at level of umbilicus-1/ML/NT. Bowel sounds active Incision: Dressing C+D+I, ON Q intact Extremities: no edema, no erythema, no tenderness  Results for orders placed or performed during the hospital encounter of 09/26/16 (from the past 72 hour(s))  CBC     Status: Abnormal   Collection Time: 09/27/16  3:56 AM  Result Value Ref Range   WBC 13.5 (H) 3.6 - 11.0 K/uL   RBC 3.87 3.80 - 5.20 MIL/uL   Hemoglobin 9.9 (L) 12.0 - 16.0 g/dL   HCT 30.8 (L) 35.0 - 47.0 %   MCV 79.6 (L) 80.0 - 100.0 fL   MCH 25.6 (L) 26.0 - 34.0 pg   MCHC 32.1 32.0 - 36.0 g/dL   RDW 15.0 (H) 11.5 - 14.5 %   Platelets 181 150 - 440 K/uL     Assessment:   26 y.o. [id number] postoperativeday # 1-stable  Trial of voiding  Ambulate   Plan:  1) Acute blood loss anemia - hemodynamically stable and asymptomatic - po ferrous sulfate/ vitamins  2) O POS/ RI/ VI  3) TDAP-UTD, last dose 11/13/2013   4)Bottle  5) Contraception: BTL  6) Disposition- on POD#2 or [place], CNM

## 2016-09-29 MED ORDER — OXYCODONE-ACETAMINOPHEN 5-325 MG PO TABS: 1.0000 | ORAL_TABLET | ORAL | 0 refills | Status: DC | PRN

## 2016-09-29 MED ORDER — TETANUS-DIPHTH-ACELL PERTUSSIS 5-2.5-18.5 LF-MCG/0.5 IM SUSP
0.5000 mL | Freq: Once | INTRAMUSCULAR | Status: AC
  Administered 2016-09-29: 0.5 mL via INTRAMUSCULAR
  Filled 2016-09-29: qty 0.5

## 2016-09-29 NOTE — Progress Notes (Signed)
Discharge order received from doctor. Tdap vaccine given prior to discharge. Reviewed discharge instructions and prescriptions with patient and answered all questions. Incision cleaning kit given. On- Q pump removal instructions given. Follow up appointment given. Patient verbalized understanding. ID bands checked. Patient discharged home with infant via wheelchair by nursing/auxillary.    Hilbert Bible, RN

## 2016-09-29 NOTE — Discharge Summary (Signed)
OB Discharge Summary     Patient Name: Brenda Wang DOB: 11-Sep-1990 MRN: [id number]  Date of admission: 09/26/2016 Delivering MD: Prentice Docker, MD  Date of Delivery: 09/26/2016  Date of discharge: 09/29/2016  Admitting diagnosis: PRIOR CESAREAN, 3 WEEKS Intrauterine pregnancy: [redacted]w[redacted]d     Secondary diagnosis: obesity, nausea and vomiting of pregnancy     Discharge diagnosis: Term Pregnancy Delivered                                                                                                Post partum procedures:none  Augmentation: none  Complications: None  Hospital course:  Sceduled C/S   26 y.o. yo G3P2002 at [redacted]w[redacted]d was admitted to the hospital 09/26/2016 for scheduled cesarean section with the following indication:Elective Repeat.  Membrane Rupture Time/Date: 9:47 AM ,09/26/2016   Patient delivered a Viable infant.09/26/2016  Details of operation can be found in separate operative note.  Pateint had an uncomplicated postpartum course.  She is ambulating, tolerating a regular diet, passing flatus, and urinating well. Patient is discharged home in stable condition on  09/29/16         Physical exam  Vitals:   09/28/16 1934 09/29/16 0000 09/29/16 0300 09/29/16 0802  BP: (!) 147/62   94/75  Pulse: 71   95  Resp: 20   18  Temp: 98 F (36.7 C) 97.9 F (36.6 C) 98.2 F (36.8 C) 98 F (36.7 C)  TempSrc: Oral Oral Oral Oral  SpO2: 97%   100%  Weight:      Height:       General: alert, cooperative and no distress Lochia: appropriate Uterine Fundus: firm Incision: Healing well with no significant drainage, staples intact, On Q pump intact DVT Evaluation: No evidence of DVT seen on physical exam.  Labs: Lab Results  Component Value Date   WBC 13.5 (H) 09/27/2016   HGB 9.9 (L) 09/27/2016   HCT 30.8 (L) 09/27/2016   MCV 79.6 (L) 09/27/2016   PLT 181 09/27/2016    Discharge instruction: per After Visit Summary.  Medications:  Allergies as of 09/29/2016    Reactions   Sulfa Antibiotics Hives      Medication List    STOP taking these medications   metoCLOPramide 10 MG tablet Commonly known as:  REGLAN   ondansetron 4 MG disintegrating tablet Commonly known as:  ZOFRAN-ODT     TAKE these medications   oxyCODONE-acetaminophen 5-325 MG tablet Commonly known as:  PERCOCET/ROXICET Take 1 tablet by mouth every 4 (four) hours as needed for moderate pain or severe pain (pain score 4-7/10).   PRENATAL VITAMINS PO Take 1 tablet by mouth daily.       Diet: routine diet  Activity: Advance as tolerated. Pelvic rest for 6 weeks.   Outpatient follow up: Follow-up Information    Will Bonnet, MD. Go to.   Specialty:  Obstetrics and Gynecology Why:  1 week appointment for incision check, staple removal Contact information: [address] [place] [address] [phone]             Postpartum contraception: Tubal Ligation Rhogam  Given postpartum: NA Rubella vaccine given postpartum: no Varicella vaccine given postpartum: no TDaP given antepartum or postpartum: postpartum  Newborn Data: Live born female  Birth Weight: 6 lb 10.2 oz (3010 g) APGAR: 8, 9   Baby Feeding: formula  Disposition:home with mother  SIGNED:  Rod Can, CNM 09/29/2016 9:35 AM

## 2016-09-29 NOTE — Discharge Instructions (Signed)
Please call your doctor or return to the ER if you experience any chest pains, shortness of breath, dizziness, visual changes, fever greater than 101, any heavy bleeding (saturating more than 1 pad per hour), large clots, or foul smelling discharge, any worsening abdominal pain and cramping that is not controlled by pain medication, or any signs of postpartum depression. No tampons, enemas, douches, or sexual intercourse for 6 weeks. Also avoid tub baths, hot tubs, or swimming for 6 weeks.   Check your incision daily for any signs of infection such as redness, warmth, increased pain, swelling, pus or foul smelling drainage   Activity: Do not lift over 10 lbs for 6 weeks  No driving for 1-2 weeks  Pelvic rest 6 weeks

## 2016-10-04 ENCOUNTER — Other Ambulatory Visit: Payer: Self-pay | Admitting: Advanced Practice Midwife

## 2016-10-05 ENCOUNTER — Other Ambulatory Visit: Payer: Self-pay | Admitting: Advanced Practice Midwife

## 2016-10-05 DIAGNOSIS — Z98891 History of uterine scar from previous surgery: Secondary | ICD-10-CM

## 2016-10-05 MED ORDER — OXYCODONE-ACETAMINOPHEN 5-325 MG PO TABS: 1.0000 | ORAL_TABLET | ORAL | 0 refills | Status: DC | PRN

## 2016-10-05 NOTE — Progress Notes (Signed)
      Postoperative Follow-up Patient presents post op from Cesarean section and bilateral tubal ligation 1weeks ago for prior C-section and undesired fertility.  Subjective: Patient reports some improvement in her preop symptoms. Eating a regular diet without difficulty. Pain is controlled without any medications.  Activity: normal activities of daily living.  Objective: There were no vitals filed for this visit.  Gen: NAD Pulmonary: no increased work of breathing Abdomen: soft, non-tender, non-distended, incision healing well D/C, there is some superficial separation of the central portion of the incision which was treated with silver nitrate Ext: no edema  Physical Exam  Abdominal:      Assessment: 26 y.o. s/p Cesarean section stable  Plan: Patient has done well after surgery with no apparent complications.  I have discussed the post-operative course to date, and the expected progress moving forward.  The patient understands what complications to be concerned about.  I will see the patient in routine follow up, or sooner if needed.    Activity plan: No heavy lifting.  Pap normal 12/30/2014  Malachy Mood 10/05/2016, 10:13 PM

## 2016-10-06 ENCOUNTER — Encounter: Payer: Self-pay | Admitting: Obstetrics and Gynecology

## 2016-10-06 ENCOUNTER — Ambulatory Visit (INDEPENDENT_AMBULATORY_CARE_PROVIDER_SITE_OTHER): Payer: BLUE CROSS/BLUE SHIELD | Admitting: Obstetrics and Gynecology

## 2016-10-06 VITALS — BP 108/78 | HR 76 | Wt 180.0 lb

## 2016-10-06 DIAGNOSIS — Z4889 Encounter for other specified surgical aftercare: Secondary | ICD-10-CM

## 2016-10-06 NOTE — Telephone Encounter (Signed)
Rx picked up by patient today.

## 2016-11-17 ENCOUNTER — Encounter: Payer: Self-pay | Admitting: Obstetrics and Gynecology

## 2016-11-17 ENCOUNTER — Ambulatory Visit (INDEPENDENT_AMBULATORY_CARE_PROVIDER_SITE_OTHER): Payer: BLUE CROSS/BLUE SHIELD | Admitting: Obstetrics and Gynecology

## 2016-11-17 NOTE — Progress Notes (Signed)
Postpartum Visit  Chief Complaint:  Chief Complaint  Patient presents with  . Postpartum Care    deliverd c/s 8/14    History of Present Illness: Patient is a 26 y.o. G3P3003 presents for postpartum visit.  Review the Delivery Report for details.  Date of delivery:  09/26/2016 This patient has no babies on file. Cesarean Section: Elective repeat Pregnancy or labor problems:  no Any problems since the delivery:  no  Newborn Details:  SINGLETON :  Maternal Details:  Breast Feeding:  yes Post partum depression/anxiety noted:  no Edinburgh Post-Partum Depression Score:  4  Date of last PAP: 12/30/2014  normal   Review of Systems: ROS  The following portions of the patient's history were reviewed and updated as appropriate: allergies, current medications, past family history, past medical history, past social history, past surgical history and problem list.  Past Medical History:  Past Medical History:  Diagnosis Date  . Anemia   . H/O: cesarean section    FTP and NRFHT  . PONV (postoperative nausea and vomiting)    nausea and vomiting after 2nd c-section  . Seizures ([place])    as a child, none recently.    Past Surgical History:  Past Surgical History:  Procedure Laterality Date  . CESAREAN SECTION  01/23/2014  . CESAREAN SECTION N/A 08/10/2015   Procedure: CESAREAN SECTION;  Surgeon: Malachy Mood, MD;  Location: [facility] ORS;  Service: Obstetrics;  Laterality: N/A;  . CESAREAN SECTION WITH BILATERAL TUBAL LIGATION Bilateral 09/26/2016   Procedure: CESAREAN SECTION WITH BILATERAL TUBAL LIGATION;  Surgeon: Malachy Mood, MD;  Location: [facility] ORS;  Service: Obstetrics;  Laterality: Bilateral;  . THERAPEUTIC ABORTION    . TUBAL LIGATION  09/26/2016   [facility]  . WISDOM TOOTH EXTRACTION      Family History:  Family History  Problem Relation Age of Onset  . Cancer Paternal Grandfather        colon ca  . Hypertension Paternal Grandmother   . Diabetes Paternal  Grandmother     Social History:  Social History   Social History  . Marital status: Married    Spouse name: N/A  . Number of children: N/A  . Years of education: N/A   Occupational History  . Not on file.   Social History Main Topics  . Smoking status: Never Smoker  . Smokeless tobacco: Never Used  . Alcohol use No     Comment: does not drink alcohol while pregnant  . Drug use: No  . Sexual activity: Yes    Birth control/ protection: Surgical     Comment: pregnant   Other Topics Concern  . Not on file   Social History Narrative  . No narrative on file    Allergies:  Allergies  Allergen Reactions  . Sulfa Antibiotics Hives    Medications: Prior to Admission medications   Medication Sig Start Date End Date Taking? Authorizing Provider  oxyCODONE-acetaminophen (PERCOCET/ROXICET) 5-325 MG tablet Take 1 tablet by mouth every 4 (four) hours as needed for moderate pain or severe pain (pain score 4-7/10). 10/05/16   Rod Can, CNM  Prenatal Multivit-Min-Fe-FA (PRENATAL VITAMINS PO) Take 1 tablet by mouth daily.    [provider]    Physical Exam Vitals:  Vitals:   11/17/16 0818  BP: 104/62  Pulse: 79    General: NAD HEENT: normocephalic, anicteric Pulmonary: No increased work of breathing Abdomen: NABS, soft, non-tender, non-distended.  Umbilicus without lesions.  No hepatomegaly, splenomegaly or masses  palpable. No evidence of hernia. Incision D/C?I Genitourinary:  External: Normal external female genitalia.  Normal urethral meatus, normal  Bartholin's and Skene's glands.    Vagina: Normal vaginal mucosa, no evidence of prolapse.    Cervix: Grossly normal in appearance, no bleeding  Uterus: Non-enlarged, mobile, normal contour.  No CMT  Adnexa: ovaries non-enlarged, no adnexal masses  Rectal: deferred Extremities: no edema, erythema, or tenderness Neurologic: Grossly intact Psychiatric: mood appropriate, affect full  Assessment: 26 y.o.  [id number] presenting for 6 week postpartum visit  Plan: Problem List Items Addressed This Visit    None       1) Contraception - s/p BTL  2)  Pap - ASCCP guidelines and rational discussed.  Patient opts for 3 year screening interval  3) Patient underwent screening for postpartum depression with no concerns noted.  4) Follow up 1 year for routine annual exam

## 2017-03-01 ENCOUNTER — Other Ambulatory Visit: Payer: Self-pay

## 2017-03-01 ENCOUNTER — Emergency Department: Payer: BLUE CROSS/BLUE SHIELD

## 2017-03-01 ENCOUNTER — Emergency Department
Admission: EM | Admit: 2017-03-01 | Discharge: 2017-03-01 | Disposition: A | Discharge status: Home / Self Care | Payer: BLUE CROSS/BLUE SHIELD | Attending: Emergency Medicine | Admitting: Emergency Medicine

## 2017-03-01 ENCOUNTER — Encounter: Payer: Self-pay | Admitting: Emergency Medicine

## 2017-03-01 DIAGNOSIS — R05 Cough: Secondary | ICD-10-CM | POA: Diagnosis present

## 2017-03-01 DIAGNOSIS — J40 Bronchitis, not specified as acute or chronic: Secondary | ICD-10-CM | POA: Insufficient documentation

## 2017-03-01 DIAGNOSIS — R059 Cough, unspecified: Secondary | ICD-10-CM

## 2017-03-01 DIAGNOSIS — Z79899 Other long term (current) drug therapy: Secondary | ICD-10-CM | POA: Insufficient documentation

## 2017-03-01 LAB — BASIC METABOLIC PANEL
Anion gap: 8 (ref 5–15)
BUN: 14 mg/dL (ref 6–20)
CHLORIDE: 104 mmol/L (ref 101–111)
CO2: 23 mmol/L (ref 22–32)
Calcium: 8.8 mg/dL — ABNORMAL LOW (ref 8.9–10.3)
Creatinine, Ser: 0.8 mg/dL (ref 0.44–1.00)
Glucose, Bld: 105 mg/dL — ABNORMAL HIGH (ref 65–99)
Potassium: 4.1 mmol/L (ref 3.5–5.1)
SODIUM: 135 mmol/L (ref 135–145)

## 2017-03-01 LAB — CBC
HCT: 39.4 % (ref 35.0–47.0)
Hemoglobin: 13.1 g/dL (ref 12.0–16.0)
MCH: 28.1 pg (ref 26.0–34.0)
MCHC: 33.4 g/dL (ref 32.0–36.0)
MCV: 84.2 fL (ref 80.0–100.0)
PLATELETS: 239 10*3/uL (ref 150–440)
RBC: 4.68 MIL/uL (ref 3.80–5.20)
RDW: 15.8 % — AB (ref 11.5–14.5)
WBC: 6.3 10*3/uL (ref 3.6–11.0)

## 2017-03-01 LAB — TROPONIN I: Troponin I: <0.03 ng/mL (ref <0.03)

## 2017-03-01 LAB — POCT PREGNANCY, URINE: PREG TEST UR: NEGATIVE

## 2017-03-01 MED ORDER — GUAIFENESIN-CODEINE 100-10 MG/5ML PO SOLN: 5.0000 mL | Freq: Four times a day (QID) | ORAL | 0 refills | Status: DC | PRN

## 2017-03-01 MED ORDER — AZITHROMYCIN 250 MG PO TABS: ORAL_TABLET | ORAL | 0 refills | Status: AC

## 2017-03-01 NOTE — ED Triage Notes (Signed)
Pt reports that she had a cough last week it went away, now it has returned causing her chest pain and shortness of breath. Denies Nausea, diaphoresis, or radiation. Cough is non-productive.

## 2017-03-01 NOTE — ED Provider Notes (Signed)
[facility] Emergency Department Provider Note  Time seen: 9:41 AM  I have reviewed the triage vital signs and the nursing notes.   HISTORY  Chief Complaint Chest Pain; Cough; and Shortness of Breath    HPI Brenda Wang is a 27 y.o. female with a past medical history of anemia, presents to the emergency department with cough.  According to the patient for the past 2 weeks she has had a cough, no sputum production, no fever, minimal congestion.  States she was feeling better until approximate 24 hours ago when she has begun feeling considerably worse.  Patient has an extremely frequent cough during examination.  Patient denies any fever during the past 2 weeks.  States minimal nasal congestion, occasional headache.  Patient states moderate central chest pain with cough.   Past Medical History:  Diagnosis Date  . Anemia   . H/O: cesarean section    FTP and NRFHT  . PONV (postoperative nausea and vomiting)    nausea and vomiting after 2nd c-section  . Seizures ([place])    as a child, none recently.    Patient Active Problem List   Diagnosis Date Noted  . Status post cesarean delivery 09/26/2016  . Supervision of high risk pregnancy, antepartum, third trimester 05/22/2016  . History of cesarean section 08/10/2015    Past Surgical History:  Procedure Laterality Date  . CESAREAN SECTION  01/23/2014  . CESAREAN SECTION N/A 08/10/2015   Procedure: CESAREAN SECTION;  Surgeon: Malachy Mood, MD;  Location: [facility] ORS;  Service: Obstetrics;  Laterality: N/A;  . CESAREAN SECTION WITH BILATERAL TUBAL LIGATION Bilateral 09/26/2016   Procedure: CESAREAN SECTION WITH BILATERAL TUBAL LIGATION;  Surgeon: Malachy Mood, MD;  Location: [facility] OR[facility];  Service: Obstetrics;  Laterality: Bilateral;  . THERAPEUTIC ABORTION    . TUBAL LIGATION  09/26/2016   [facility]  . WISDOM TOOTH EXTRACTION      Prior to Admission medications   Medication Sig Start Date End Date  Taking? Authorizing Provider  oxyCODONE-acetaminophen (PERCOCET/ROXICET) 5-325 MG tablet Take 1 tablet by mouth every 4 (four) hours as needed for moderate pain or severe pain (pain score 4-7/10). 10/05/16   Rod Can, CNM  Prenatal Multivit-Min-Fe-FA (PRENATAL VITAMINS PO) Take 1 tablet by mouth daily.    [provider]    Allergies  Allergen Reactions  . Sulfa Antibiotics Hives    Family History  Problem Relation Age of Onset  . Cancer Paternal Grandfather        colon ca  . Hypertension Paternal Grandmother   . Diabetes Paternal Grandmother     Social History Social History   Tobacco Use  . Smoking status: Never Smoker  . Smokeless tobacco: Never Used  Substance Use Topics  . Alcohol use: No    Alcohol/week: 2.4 oz    Types: 4 Glasses of wine per week    Comment: does not drink alcohol while pregnant  . Drug use: No    Review of Systems Constitutional: Negative for fever. Eyes: Negative for visual complaints ENT: Negative for recent illness/congestion Cardiovascular: Chest pain with cough. Respiratory: Mild shortness of breath when coughing.  Significant dry cough.  No sputum production. Gastrointestinal: Negative for abdominal pain, vomiting Genitourinary: Negative for urinary compaints Musculoskeletal: Chest wall pain with cough. Skin: Negative for skin complaints  Neurological: Negative for headache All other ROS negative  ____________________________________________   PHYSICAL EXAM:  VITAL SIGNS: ED Triage Vitals  Enc Vitals Group     BP 03/01/17 0827 Marland Kitchen)  148/97     Pulse Rate 03/01/17 0827 68     Resp 03/01/17 0827 20     Temp 03/01/17 0827 98.4 F (36.9 C)     Temp Source 03/01/17 0827 Oral     SpO2 03/01/17 0827 97 %     Weight 03/01/17 0829 188 lb (85.3 kg)     Height 03/01/17 0829 5\' 4"  (1.626 m)     Head Circumference --      Peak Flow --      Pain Score 03/01/17 0827 6     Pain Loc --      Pain Edu? --      Excl. in [place]? --      Constitutional: Alert and oriented. Well appearing and in no distress.  Very frequent cough during examination.  No sputum production. Eyes: Normal exam ENT   Head: Normocephalic and atraumatic.   Nose: Mild congestion   Mouth/Throat: Mucous membranes are moist.  No pharyngeal erythema noted Cardiovascular: Normal rate, regular rhythm. No murmur Respiratory: Normal respiratory effort without tachypnea nor retractions. Breath sounds are clear, however patient does have an extremely frequent cough during exam. Gastrointestinal: Soft and nontender. No distention.  Musculoskeletal: Nontender with normal range of motion in all extremities.  Neurologic:  Normal speech and language. No gross focal neurologic deficits  Skin:  Skin is warm, dry and intact.  Psychiatric: Mood and affect are normal.  ____________________________________________    EKG  EKG reviewed and interpreted by myself shows normal sinus rhythm 89 bpm with a narrow QRS, normal axis, normal intervals, no concerning ST changes.  ____________________________________________    RADIOLOGY  Chest x-ray clear  ____________________________________________   INITIAL IMPRESSION / ASSESSMENT AND PLAN / ED COURSE  Pertinent labs & imaging results that were available during my care of the patient were reviewed by me and considered in my medical decision making (see chart for details).  Patient presents to the emergency department for 2 weeks of dry cough.  Differential would include pneumonia, URI, bronchitis, ACS.  Patient's labs including white blood cell count and troponin are normal.  Chest x-ray is normal.  EKG is normal.  Overall the patient appears well but does have a very frequent dry cough during exam.  We will treat with Zithromax to cover for possible acute bronchitis.  I also discussed with the patient using a humidifier at home and avoiding dry cold air.  Patient does not smoke cigarettes.  We will  discharge with a cough medication including codeine.  I discussed with the patient not drinking alcohol or driving while taking this medication.  Patient agreeable to this plan of care and will follow up with a primary care doctor in the next several days for recheck.  I discussed my normal chest pain return precautions.  ____________________________________________   FINAL CLINICAL IMPRESSION(S) / ED DIAGNOSES  Bronchitis    Harvest Dark, MD 03/01/17 [phone]

## 2017-03-01 NOTE — Discharge Instructions (Signed)
Please take your medications as prescribed and cough medication as prescribed, as needed.  Return to the emergency department for any worsening chest pain, trouble breathing, or any other symptom personally concerning to yourself.  Otherwise please follow-up with your doctor in the next 2-3 days for recheck/reevaluation.

## 2017-06-25 ENCOUNTER — Encounter: Payer: Self-pay | Admitting: Family Medicine

## 2017-06-26 ENCOUNTER — Ambulatory Visit: Payer: BLUE CROSS/BLUE SHIELD | Admitting: Family Medicine

## 2018-06-12 ENCOUNTER — Ambulatory Visit: Payer: Medicaid Other | Admitting: Family Medicine

## 2018-06-12 ENCOUNTER — Other Ambulatory Visit: Payer: Self-pay

## 2018-10-23 ENCOUNTER — Encounter: Payer: Self-pay | Admitting: Family Medicine

## 2018-10-23 ENCOUNTER — Ambulatory Visit: Payer: BC Managed Care – PPO | Admitting: Family Medicine

## 2018-10-23 ENCOUNTER — Other Ambulatory Visit: Payer: Self-pay

## 2018-10-23 VITALS — BP 122/64 | HR 77 | Temp 98.6°F | Resp 18 | Ht 64.0 in | Wt 201.6 lb

## 2018-10-23 DIAGNOSIS — Z23 Encounter for immunization: Secondary | ICD-10-CM | POA: Diagnosis not present

## 2018-10-23 DIAGNOSIS — Z713 Dietary counseling and surveillance: Secondary | ICD-10-CM

## 2018-10-23 DIAGNOSIS — R638 Other symptoms and signs concerning food and fluid intake: Secondary | ICD-10-CM

## 2018-10-23 LAB — CBC WITH DIFFERENTIAL/PLATELET
Basophils Absolute: 0 10*3/uL (ref 0.0–0.1)
Basophils Relative: 0.6 % (ref 0.0–3.0)
Eosinophils Absolute: 0 10*3/uL (ref 0.0–0.7)
Eosinophils Relative: 0.7 % (ref 0.0–5.0)
HCT: 40.2 % (ref 36.0–46.0)
Hemoglobin: 13.2 g/dL (ref 12.0–15.0)
Lymphocytes Relative: 27.5 % (ref 12.0–46.0)
Lymphs Abs: 1.3 10*3/uL (ref 0.7–4.0)
MCHC: 32.8 g/dL (ref 30.0–36.0)
MCV: 88.2 fl (ref 78.0–100.0)
Monocytes Absolute: 0.2 10*3/uL (ref 0.1–1.0)
Monocytes Relative: 5.2 % (ref 3.0–12.0)
Neutro Abs: 3.2 10*3/uL (ref 1.4–7.7)
Neutrophils Relative %: 66 % (ref 43.0–77.0)
Platelets: 246 10*3/uL (ref 150.0–400.0)
RBC: 4.56 Mil/uL (ref 3.87–5.11)
RDW: 14.4 % (ref 11.5–15.5)
WBC: 4.8 10*3/uL (ref 4.0–10.5)

## 2018-10-23 LAB — COMPREHENSIVE METABOLIC PANEL
ALT: 13 U/L (ref 0–35)
AST: 13 U/L (ref 0–37)
Albumin: 4 g/dL (ref 3.5–5.2)
Alkaline Phosphatase: 75 U/L (ref 39–117)
BUN: 12 mg/dL (ref 6–23)
CO2: 28 mEq/L (ref 19–32)
Calcium: 9.1 mg/dL (ref 8.4–10.5)
Chloride: 104 mEq/L (ref 96–112)
Creatinine, Ser: 0.74 mg/dL (ref 0.40–1.20)
GFR: 112.05 mL/min (ref >60.00)
Glucose, Bld: 102 mg/dL — ABNORMAL HIGH (ref 70–99)
Potassium: 4.7 mEq/L (ref 3.5–5.1)
Sodium: 137 mEq/L (ref 135–145)
Total Bilirubin: 0.6 mg/dL (ref 0.2–1.2)
Total Protein: 7 g/dL (ref 6.0–8.3)

## 2018-10-23 LAB — B12 AND FOLATE PANEL
Folate: 7.6 ng/mL (ref >5.9)
Vitamin B-12: 221 pg/mL (ref 211–911)

## 2018-10-23 LAB — TSH: TSH: 1.64 u[IU]/mL (ref 0.35–4.50)

## 2018-10-23 LAB — VITAMIN D 25 HYDROXY (VIT D DEFICIENCY, FRACTURES): VITD: 26.12 ng/mL — ABNORMAL LOW (ref 30.00–100.00)

## 2018-10-23 NOTE — Progress Notes (Signed)
Subjective:    Patient ID: Brenda Wang, female    DOB: 02-15-1990, 28 y.o.   MRN: [id number]  HPI   Patient presents to clinic to reestablish care.  Was last seen in 2017 by Dr. Lacinda Axon, he has since left the practice.  She has not been seen here in over 3 years so she is technically a new patient.  Past medical, social, surgical and family history reviewed and updated in chart.  Patient is a Music therapist, they are currently doing virtual school and so far this is been difficult but been going okay.  She also has 3 small children, all under the age of 20.  She is married.  Main concern today is weight loss.  Currently she eats mainly seafood as her source of protein, lots of vegetables and fruits but states she usually only eats 1 big meal per day.  States her husband told her she most likely has to eat small meals throughout the day to jumpstart her metabolism.  She does snack, but usually tries to eat peanuts or more healthy snacks.  Used to eat a lot of potato chips but does not do this anymore.  Main fluid intake is water, avoids sodas and sweet tea.  Past Medical History:  Diagnosis Date  . Anemia   . H/O: cesarean section    FTP and NRFHT  . PONV (postoperative nausea and vomiting)    nausea and vomiting after 2nd c-section  . Seizures ([place])    as a child, none recently.   Past Surgical History:  Procedure Laterality Date  . CESAREAN SECTION  01/23/2014  . CESAREAN SECTION N/A 08/10/2015   Procedure: CESAREAN SECTION;  Surgeon: Malachy Mood, MD;  Location: [facility] OR[facility];  Service: Obstetrics;  Laterality: N/A;  . CESAREAN SECTION WITH BILATERAL TUBAL LIGATION Bilateral 09/26/2016   Procedure: CESAREAN SECTION WITH BILATERAL TUBAL LIGATION;  Surgeon: Malachy Mood, MD;  Location: [facility] ORS;  Service: Obstetrics;  Laterality: Bilateral;  . THERAPEUTIC ABORTION    . TUBAL LIGATION  09/26/2016   [facility]  . WISDOM TOOTH EXTRACTION     Family History  Problem Relation Age of  Onset  . Cancer Paternal Grandfather        colon ca  . Hypertension Paternal Grandmother   . Diabetes Paternal Grandmother    Social History   Tobacco Use  . Smoking status: Never Smoker  . Smokeless tobacco: Never Used  Substance Use Topics  . Alcohol use: Yes    Alcohol/week: 4.0 standard drinks    Types: 4 Glasses of wine per week    Review of Systems  Constitutional: Negative for chills, fatigue and fever.  HENT: Negative for congestion, ear pain, sinus pain and sore throat.   Eyes: Negative.   Respiratory: Negative for cough, shortness of breath and wheezing.   Cardiovascular: Negative for chest pain, palpitations and leg swelling.  Gastrointestinal: Negative for abdominal pain, diarrhea, nausea and vomiting.  Genitourinary: Negative for dysuria, frequency and urgency.  Musculoskeletal: Negative for arthralgias and myalgias.  Skin: Negative for color change, pallor and rash.  Neurological: Negative for syncope, light-headedness and headaches.  Psychiatric/Behavioral: The patient is not nervous/anxious.       Objective:   Physical Exam Vitals signs and nursing note reviewed.  Constitutional:      General: She is not in acute distress.    Appearance: She is not ill-appearing, toxic-appearing or diaphoretic.  HENT:     Head: Normocephalic and atraumatic.  Eyes:  General: No scleral icterus.    Extraocular Movements: Extraocular movements intact.     Conjunctiva/sclera: Conjunctivae normal.     Pupils: Pupils are equal, round, and reactive to light.  Neck:     Musculoskeletal: Normal range of motion and neck supple. No neck rigidity.     Thyroid: No thyromegaly or thyroid tenderness.  Cardiovascular:     Rate and Rhythm: Normal rate and regular rhythm.     Heart sounds: Normal heart sounds.  Pulmonary:     Effort: Pulmonary effort is normal. No respiratory distress.     Breath sounds: Normal breath sounds.  Skin:    General: Skin is warm and dry.      Capillary Refill: Capillary refill takes less than 2 seconds.     Coloration: Skin is not jaundiced or pale.  Neurological:     General: No focal deficit present.     Mental Status: She is alert and oriented to person, place, and time.  Psychiatric:        Mood and Affect: Mood normal.        Behavior: Behavior normal.        Thought Content: Thought content normal.    Today's Vitals   10/23/18 0914  BP: 122/64  Pulse: 77  Resp: 18  Temp: 98.6 F (37 C)  TempSrc: Oral  SpO2: 93%  Weight: 201 lb 9.6 oz (91.4 kg)  Height: _0  (1.626 m)   Body mass index is 34.6 kg/m.  Wt Readings from Last 3 Encounters:  10/23/18 201 lb 9.6 oz (91.4 kg)  03/01/17 188 lb (85.3 kg)  11/17/16 185 lb (83.9 kg)      Assessment & Plan:    1. Need for immunization against influenza  - Flu Vaccine QUAD 36+ mos IM  2. Unable to lose weight/ Weight loss counseling, encounter for We will get blood work to look at any other possible causes as to reasons why she is having difficulty losing weight.  Discussed eating small nutritious meals throughout the day to help keep metabolism going.  Recommended drinking at minimum 70 to 100 ounces of water per day.  Recommended regular physical activity.   - CBC w/Diff - Comp Met (CMET) - TSH - VITAMIN D 25 Hydroxy (Vit-D Deficiency, Fractures) - B12 and Folate Panel  We will have patient follow-up in about 3 months for recheck and weight loss progress.  Aware she can return to clinic sooner if any issues arise.  Encouraged patient to keep a positive outlook as weight loss is a long journey and does not happen quickly, it is about consistency and finding a food and exercise program that is maintainable.

## 2018-10-23 NOTE — Patient Instructions (Addendum)
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  Your goal with exercise is a minimum of 30 minutes of aerobic exercise 5 days per week (Walking does not count once it becomes easy!)   DRINK WATER    All of the foods can be found at grocery stores and in bulk at [employer].  The Atkins protein bars and shakes are available in more varieties at [employer], [employer] and [place].       7 AM Breakfast:  Choose from the following:             Low carbohydrate Protein  Shakes (I recommend the  Premier Protein chocolate shakes,  EAS AdvantEdge "Carb Control" shakes  Or the Atkins shakes all are under 3 net carbs)                           a scrambled egg/bacon/cheese burrito made with Mission's "carb balance" whole wheat tortilla  (about 10 net carbs )             Regulatory affairs officer (basically a quiche without the pastry crust) that is eaten cold and very convenient way to get your eggs.  8 carbs)             If you make your own protein shakes, avoid bananas and pineapple,  And use low carb greek yogurt or original /unsweetened almond or soy milk     Avoid cereal and bananas, oatmeal and cream of wheat and grits. They are loaded with carbohydrates!     10 AM: high protein snack:             Protein bar by Atkins (the snack size, under 200 cal, usually < 6 net carbs).               A stick of cheese:  Around 1 carb,  100 cal                Dannon Light n Fit [place] Yogurt  (80 cal, 8 carbs)  Other so called "protein bars" and Greek yogurts tend to be loaded with carbohydrates.  Remember, in food advertising, the word "energy" is synonymous for " carbohydrate."   Lunch:              A Sandwich using the bread choices listed, Can use any  Eggs,  lunchmeat, grilled meat or canned tuna), avocado, regular mayo/mustard  and cheese.             A Salad using blue cheese, ranch,  Goddess or vinagrette,   Avoid taco shells, croutons or "confetti" and no "candied nuts" but regular nuts OK.              No pretzels, nabs  or chips.  Pickles and miniature sweet peppers are a good low carb alternative that provide a "crunch"  The bread is the only source of carbohydrate in a sandwich and  can be decreased by trying some of the attached alternatives to traditional loaf bread              Avoid "Low fat dressings, as well as Barry Brunner and [employer] dressings They are loaded with sugar!     3 PM/ Mid day  Snack:  Consider  1 ounce of  almonds, walnuts, pistachios, pecans, peanuts,  Macadamia nuts or a nut medley.  Avoid "granola and granola bars "  Mixed nuts are ok in moderation as long as there are no raisins,  cranberries or dried fruit.   KIND bars are OK if you get the low glycemic index variety   Try the prosciutto/mozzarella cheese sticks by [employer]  In deli /backery section   High protein                 6 PM  Dinner:                Meat/fowl/fish with a green salad, and either broccoli, cauliflower, green beans, spinach, brussel sprouts or  Lima beans. DO NOT BREAD THE PROTEIN!!               There is a low carb pasta by Dreamfield's that is acceptable and tastes great: only 5 digestible carbs/serving.( All grocery stores but BJs carry it ) Several ready made meals are available low carb:              Try Michel Angelo's chicken piccata or chicken or eggplant parm over low carb pasta.(Lowes and BJs)              Marjory Lies Sanchez's "Carnitas" (pulled pork, no sauce,  0 carbs) or his beef pot roast to make a dinner burrito (at [employer]J's)             Pesto over low carb pasta (bj's sells a good quality pesto in the center refrigerated section of the deli              Try satueeing  Cheral Marker with mushroooms as a good side              [employer] Giant makes a mashed cauliflower that tastes like mashed potatoes   Whole wheat pasta is still full of digestible carbs and  Not as low in glycemic  index as Dreamfield's.   Brown rice is still rice,  So skip the rice and noodles if you eat [place] or [place] (or at least limit to 1/2 cup)   9 PM snack :              Breyer's "low carb" fudgsicle or  ice cream bar (Carb Smart line), or  Weight Watcher's ice cream bar , or another "no sugar added" ice cream;             a serving of fresh berries/cherries with whipped cream              Cheese or DANNON'S LlGHT N FIT GREEK YOGURT             8 ounces of [place] unsweetened almond/cococunut milk               Treat yourself to a parfait made with whipped cream blueberiies, walnuts and vanilla greek yogurt   Avoid bananas, pineapple, grapes  and watermelon on a regular basis because they are high in sugar.  THINK OF THEM AS DESSERT   Remember that snack Substitutions should be less than 10 NET carbs per serving and meals < 20 carbs. Remember to subtract fiber grams to get the "net carbs."

## 2018-10-25 ENCOUNTER — Telehealth: Payer: Self-pay | Admitting: Family Medicine

## 2018-10-25 NOTE — Telephone Encounter (Signed)
Pt dropped off health form to be filled out. Placed in Lauren's color folder up front

## 2018-10-28 ENCOUNTER — Telehealth: Payer: Self-pay | Admitting: Lab

## 2018-10-28 NOTE — Telephone Encounter (Signed)
Picked up form from file gave to provider

## 2018-10-28 NOTE — Telephone Encounter (Signed)
Called Pt to schedule a nurse visit to have her TB test done. Pt will also have to bring in proof of MMR & Hep B so that NP Philis Nettle can sign forms

## 2018-10-29 NOTE — Telephone Encounter (Signed)
Yes needs TB testing and vaccine records before I can sign her form

## 2018-11-01 ENCOUNTER — Telehealth: Payer: Self-pay | Admitting: Lab

## 2018-11-01 NOTE — Telephone Encounter (Signed)
Called Pt No answer, left a VM. Pt needs an appt for a nurse visit for Tetanus immunization. Also Pt needs to bring in proof of her MMR also Heb B so that NP Philis Nettle can fill out her Health examination form.

## 2019-01-14 ENCOUNTER — Other Ambulatory Visit: Payer: Self-pay

## 2019-01-14 DIAGNOSIS — Z20822 Contact with and (suspected) exposure to covid-19: Secondary | ICD-10-CM

## 2019-01-16 LAB — NOVEL CORONAVIRUS, NAA: SARS-CoV-2, NAA: NOT DETECTED

## 2019-01-31 ENCOUNTER — Ambulatory Visit: Payer: BC Managed Care – PPO | Admitting: Family Medicine

## 2019-03-31 ENCOUNTER — Ambulatory Visit: Payer: Medicaid Other | Admitting: Obstetrics & Gynecology

## 2019-04-15 ENCOUNTER — Other Ambulatory Visit: Payer: Self-pay

## 2019-04-15 ENCOUNTER — Other Ambulatory Visit (HOSPITAL_COMMUNITY)
Admission: RE | Admit: 2019-04-15 | Discharge: 2019-04-15 | Disposition: A | Discharge status: Home / Self Care | Payer: Medicaid Other | Source: Ambulatory Visit | Attending: Obstetrics & Gynecology | Admitting: Obstetrics & Gynecology

## 2019-04-15 ENCOUNTER — Ambulatory Visit (INDEPENDENT_AMBULATORY_CARE_PROVIDER_SITE_OTHER): Payer: Medicaid Other | Admitting: Obstetrics & Gynecology

## 2019-04-15 ENCOUNTER — Encounter: Payer: Self-pay | Admitting: Obstetrics & Gynecology

## 2019-04-15 VITALS — BP 100/60 | Ht 64.0 in | Wt 214.0 lb

## 2019-04-15 DIAGNOSIS — R102 Pelvic and perineal pain: Secondary | ICD-10-CM | POA: Insufficient documentation

## 2019-04-15 DIAGNOSIS — N926 Irregular menstruation, unspecified: Secondary | ICD-10-CM

## 2019-04-15 DIAGNOSIS — N925 Other specified irregular menstruation: Secondary | ICD-10-CM | POA: Diagnosis not present

## 2019-04-15 DIAGNOSIS — Z124 Encounter for screening for malignant neoplasm of cervix: Secondary | ICD-10-CM | POA: Diagnosis present

## 2019-04-15 DIAGNOSIS — N9419 Other specified dyspareunia: Secondary | ICD-10-CM | POA: Insufficient documentation

## 2019-04-15 NOTE — Progress Notes (Signed)
Gynecology Pelvic Pain Evaluation   Chief Complaint  Patient presents with  . Pelvic Pain    History of Present Illness:   Patient is a 29 y.o. [id number] who LMP was Patient's last menstrual period was 03/27/2019., presents today for a problem visit.  She complains of pain.   Her pain is localized to the deep pelvis area, described as constant, began about a year or more ago and its severity is described as severe. The pain radiates to the  back. She has these associated symptoms which include nausea.  She also has dyspareunia most times, deep.  Period have been irregular since last CS BTL. Pain is worse w periods. Patient has these modifiers which include CBD cream that make it better and activity that make it worse. Other meds no help.  Worse since NOV.  Context includes: she has had 3 prior CS, and BTL with the last.  Previous evaluation: none. Prior Diagnosis: none. Previous Treatment: none.  PMHx: She  has a past medical history of Anemia, H/O: cesarean section, PONV (postoperative nausea and vomiting), and Seizures ([place]). Also,  has a past surgical history that includes Therapeutic abortion; Cesarean section (01/23/2014); Wisdom tooth extraction; Cesarean section (N/A, 08/10/2015); Cesarean section with bilateral tubal ligation (Bilateral, 09/26/2016); and Tubal ligation (09/26/2016)., family history includes Cancer in her paternal grandfather; Diabetes in her paternal grandmother; Hypertension in her paternal grandmother.,  reports that she has never smoked. She has never used smokeless tobacco. She reports current alcohol use of about 4.0 standard drinks of alcohol per week. She reports that she does not use drugs.  She currently has no medications in their medication list. Also, is allergic to sulfa antibiotics.  Review of Systems  Constitutional: Positive for malaise/fatigue. Negative for chills and fever.  HENT: Negative for congestion, sinus pain and sore throat.   Eyes: Negative  for blurred vision and pain.  Respiratory: Negative for cough and wheezing.   Cardiovascular: Negative for chest pain and leg swelling.  Gastrointestinal: Positive for abdominal pain and nausea. Negative for constipation, diarrhea, heartburn and vomiting.  Genitourinary: Negative for dysuria, frequency, hematuria and urgency.  Musculoskeletal: Negative for back pain, joint pain, myalgias and neck pain.  Skin: Negative for itching and rash.  Neurological: Negative for dizziness, tremors and weakness.  Endo/Heme/Allergies: Does not bruise/bleed easily.  Psychiatric/Behavioral: Negative for depression. The patient is not nervous/anxious and does not have insomnia.     Objective: BP 100/60   Ht 5\' 4"  (1.626 m)   Wt 214 lb (97.1 kg)   LMP 03/27/2019   BMI 36.73 kg/m  Physical Exam Constitutional:      General: She is not in acute distress.    Appearance: She is well-developed.  Genitourinary:     Pelvic exam was performed with patient supine.     Urethra, bladder, vagina and rectum normal.     No lesions in the vagina.     No vaginal bleeding.     No cervical motion tenderness, friability, lesion or polyp.     Uterus is tender and mobile.     Uterus is not enlarged.     No uterine mass detected.    Uterus is midaxial and regular.     No right or left adnexal mass present.     Right adnexa not tender.     Left adnexa not tender.  HENT:     Head: Normocephalic and atraumatic. No laceration.     Right Ear: Hearing normal.  Left Ear: Hearing normal.     Mouth/Throat:     Pharynx: Uvula midline.  Eyes:     Pupils: Pupils are equal, round, and reactive to light.  Neck:     Thyroid: No thyromegaly.  Cardiovascular:     Rate and Rhythm: Normal rate and regular rhythm.     Heart sounds: No murmur. No friction rub. No gallop.   Pulmonary:     Effort: Pulmonary effort is normal. No respiratory distress.     Breath sounds: Normal breath sounds. No wheezing.  Abdominal:      General: Bowel sounds are normal. There is no distension.     Palpations: Abdomen is soft.     Tenderness: There is no abdominal tenderness. There is no rebound.  Musculoskeletal:        General: Normal range of motion.     Cervical back: Normal range of motion and neck supple.  Neurological:     Mental Status: She is alert and oriented to person, place, and time.     Cranial Nerves: No cranial nerve deficit.  Skin:    General: Skin is warm and dry.  Psychiatric:        Judgment: Judgment normal.  Vitals reviewed.     Female chaperone present for pelvic portion of the physical exam  Assessment: 29 y.o. [id number] with CPP and Dyspareunia.  1. Pelvic pain - US PELVIC COMPLETE WITH TRANSVAGINAL; Future  2. Screening for cervical cancer - Cytology - PAP  3. Dyspareunia due to medical condition in female  4. Irregular menses  Options for PT, Hormonal therapy, and surgery all discussed today    (Adhesion etiology, endometriosis, fibroids, other).  A total of 45 minutes were spent face-to-face with the patient as well as preparation, review, communication, and documentation during this encounter.   Barnett Applebaum, MD, [facility], [facility] 04/15/2019  8:26 AM

## 2019-04-15 NOTE — Patient Instructions (Signed)
Pelvic Pain, Female Pelvic pain is pain in your lower abdomen, below your belly button and between your hips. The pain may start suddenly (be acute), keep coming back (be recurring), or last a long time (become chronic). Pelvic pain that lasts longer than 6 months is considered chronic. Pelvic pain may affect your:  Reproductive organs.  Urinary system.  Digestive tract.  Musculoskeletal system. There are many potential causes of pelvic pain. Sometimes, the pain can be a result of digestive or urinary conditions, strained muscles or ligaments, or reproductive conditions. Sometimes the cause of pelvic pain is not known.  We will assess with Ultrasound next Consider Physical Therapy, Hormone Therapy, or Surgery as options for treatment.  Follow these instructions at home:   Take over-the-counter and prescription medicines only as told by your health care provider.  Rest as told by your health care provider.  Do not have sex if it hurts.  Keep a journal of your pelvic pain. Write down: ? When the pain started. ? Where the pain is located. ? What seems to make the pain better or worse, such as food or your period (menstrual cycle). ? Any symptoms you have along with the pain.  Keep all follow-up visits as told by your health care provider. This is important. Contact a health care provider if:  Medicine does not help your pain.  Your pain comes back.  You have new symptoms.  You have abnormal vaginal discharge or bleeding, including bleeding after menopause.  You have a fever or chills.  You are constipated.  You have blood in your urine or stool.  You have foul-smelling urine.  You feel weak or light-headed. Get help right away if:  You have sudden severe pain.  Your pain gets steadily worse.  You have severe pain along with fever, nausea, vomiting, or excessive sweating.  You lose consciousness. Summary  Pelvic pain is pain in your lower abdomen, below your  belly button and between your hips.  There are many potential causes of pelvic pain.  Keep a journal of your pelvic pain. This information is not intended to replace advice given to you by your health care provider. Make sure you discuss any questions you have with your health care provider. Document Revised: 07/18/2017 Document Reviewed: 07/18/2017 Elsevier Patient Education  [name].

## 2019-04-16 LAB — CYTOLOGY - PAP
Adequacy: ABSENT
Chlamydia: NEGATIVE
Comment: NEGATIVE
Comment: NORMAL
Diagnosis: NEGATIVE
Neisseria Gonorrhea: NEGATIVE

## 2019-04-24 ENCOUNTER — Other Ambulatory Visit: Payer: Self-pay

## 2019-04-24 ENCOUNTER — Other Ambulatory Visit: Payer: Self-pay | Admitting: Obstetrics & Gynecology

## 2019-04-24 ENCOUNTER — Encounter: Payer: Self-pay | Admitting: Obstetrics & Gynecology

## 2019-04-24 ENCOUNTER — Ambulatory Visit (INDEPENDENT_AMBULATORY_CARE_PROVIDER_SITE_OTHER): Payer: Medicaid Other | Admitting: Obstetrics & Gynecology

## 2019-04-24 ENCOUNTER — Ambulatory Visit (INDEPENDENT_AMBULATORY_CARE_PROVIDER_SITE_OTHER): Payer: Medicaid Other

## 2019-04-24 VITALS — BP 120/80 | Ht 64.0 in | Wt 214.0 lb

## 2019-04-24 DIAGNOSIS — R102 Pelvic and perineal pain: Secondary | ICD-10-CM | POA: Diagnosis not present

## 2019-04-24 DIAGNOSIS — N9419 Other specified dyspareunia: Secondary | ICD-10-CM

## 2019-04-24 DIAGNOSIS — N925 Other specified irregular menstruation: Secondary | ICD-10-CM | POA: Diagnosis not present

## 2019-04-24 DIAGNOSIS — N926 Irregular menstruation, unspecified: Secondary | ICD-10-CM

## 2019-04-24 NOTE — Progress Notes (Signed)
PRE-OPERATIVE HISTORY AND PHYSICAL EXAM  HPI:  Brenda Wang is a 29 y.o. G3P3003 Patient's last menstrual period was 03/25/2019.; she is being admitted for surgery related to pelvic pain.  Her pain is localized to the deep pelvis area, described as constant, began about a year or more ago and its severity is described as severe. The pain radiates to the  back. She has these associated symptoms which include nausea.  She also has dyspareunia most times, deep.  Period have been irregular since last CS BTL. Pain is worse w periods. Patient has these modifiers which include CBD cream that make it better and activity that make it worse. Other meds no help.  Worse since NOV.  Context includes: she has had 3 prior CS, and BTL with the last.  Normal ultrasound.  No desire for future pregnancy.  PMHx: Past Medical History:  Diagnosis Date  . Anemia   . H/O: cesarean section    FTP and NRFHT  . PONV (postoperative nausea and vomiting)    nausea and vomiting after 2nd c-section  . Seizures ([place])    as a child, none recently.   Past Surgical History:  Procedure Laterality Date  . CESAREAN SECTION  01/23/2014  . CESAREAN SECTION N/A 08/10/2015   Procedure: CESAREAN SECTION;  Surgeon: Malachy Mood, MD;  Location: [facility] ORS;  Service: Obstetrics;  Laterality: N/A;  . CESAREAN SECTION WITH BILATERAL TUBAL LIGATION Bilateral 09/26/2016   Procedure: CESAREAN SECTION WITH BILATERAL TUBAL LIGATION;  Surgeon: Malachy Mood, MD;  Location: [facility] ORS;  Service: Obstetrics;  Laterality: Bilateral;  . THERAPEUTIC ABORTION    . TUBAL LIGATION  09/26/2016   [facility]  . WISDOM TOOTH EXTRACTION     Family History  Problem Relation Age of Onset  . Cancer Paternal Grandfather        colon ca  . Hypertension Paternal Grandmother   . Diabetes Paternal Grandmother    Social History   Tobacco Use  . Smoking status: Never Smoker  . Smokeless tobacco: Never Used  Substance Use Topics  . Alcohol  use: Yes    Alcohol/week: 4.0 standard drinks    Types: 4 Glasses of wine per week  . Drug use: No   No current outpatient medications on file. Allergies: Sulfa antibiotics  Review of Systems  Constitutional: Positive for malaise/fatigue. Negative for chills and fever.  HENT: Negative for congestion, sinus pain and sore throat.   Eyes: Negative for blurred vision and pain.  Respiratory: Negative for cough and wheezing.   Cardiovascular: Negative for chest pain and leg swelling.  Gastrointestinal: Positive for abdominal pain and nausea. Negative for constipation, diarrhea, heartburn and vomiting.  Genitourinary: Negative for dysuria, frequency, hematuria and urgency.  Musculoskeletal: Negative for back pain, joint pain, myalgias and neck pain.  Skin: Negative for itching and rash.  Neurological: Negative for dizziness, tremors and weakness.  Endo/Heme/Allergies: Does not bruise/bleed easily.  Psychiatric/Behavioral: Negative for depression. The patient is not nervous/anxious and does not have insomnia.     Objective: BP 120/80   Ht 5\' 4"  (1.626 m)   Wt 214 lb (97.1 kg)   LMP 03/25/2019   BMI 36.73 kg/m   Filed Weights   04/24/19 0834  Weight: 214 lb (97.1 kg)   Physical Exam Constitutional:      General: She is not in acute distress.    Appearance: She is well-developed.  HENT:     Head: Normocephalic and atraumatic. No laceration.  Right Ear: Hearing normal.     Left Ear: Hearing normal.     Mouth/Throat:     Pharynx: Uvula midline.  Eyes:     Pupils: Pupils are equal, round, and reactive to light.  Neck:     Thyroid: No thyromegaly.  Cardiovascular:     Rate and Rhythm: Normal rate and regular rhythm.     Heart sounds: No murmur. No friction rub. No gallop.   Pulmonary:     Effort: Pulmonary effort is normal. No respiratory distress.     Breath sounds: Normal breath sounds. No wheezing.  Chest:     Breasts:        Right: No mass, skin change or tenderness.         Left: No mass, skin change or tenderness.  Abdominal:     General: Bowel sounds are normal. There is no distension.     Palpations: Abdomen is soft.     Tenderness: There is no abdominal tenderness. There is no rebound.  Musculoskeletal:        General: Normal range of motion.     Cervical back: Normal range of motion and neck supple.  Neurological:     Mental Status: She is alert and oriented to person, place, and time.     Cranial Nerves: No cranial nerve deficit.  Skin:    General: Skin is warm and dry.  Psychiatric:        Judgment: Judgment normal.  Vitals reviewed.     Assessment: 1. Pelvic pain   2. Irregular menses   3. Dyspareunia due to medical condition in female   Plan Diagnostic laparoscopy and cystoscopy Also consideration for bilateral salpingectomy to remove any source of pain from tubal swelling, impingement.  No desire for future pregnancy (that would require tubal reversal) Necessary procedures for pain, such as endometriosis excision, counseled as well  I have had a careful discussion with this patient about all the options available and the risk/benefits of each. I have fully informed this patient that surgery may subject her to a variety of discomforts and risks: She understands that most patients have surgery with little difficulty, but problems can happen ranging from minor to fatal. These include nausea, vomiting, pain, bleeding, infection, poor healing, hernia, or formation of adhesions. Unexpected reactions may occur from any drug or anesthetic given. Unintended injury may occur to other pelvic or abdominal structures such as Fallopian tubes, ovaries, bladder, ureter (tube from kidney to bladder), or bowel. Nerves going from the pelvis to the legs may be injured. Any such injury may require immediate or later additional surgery to correct the problem. Excessive blood loss requiring transfusion is very unlikely but possible. Dangerous blood clots may form  in the legs or lungs. Physical and sexual activity will be restricted in varying degrees for an indeterminate period of time but most often 2-6 weeks.  Finally, she understands that it is impossible to list every possible undesirable effect and that the condition for which surgery is done is not always cured or significantly improved, and in rare cases may be even worse.Ample time was given to answer all questions.  Barnett Applebaum, MD, [facility], [facility] 04/24/2019  9:15 AM

## 2019-04-24 NOTE — Patient Instructions (Signed)
Diagnostic Laparoscopy, Care After This sheet gives you information about how to care for yourself after your procedure. Your health care provider may also give you more specific instructions. If you have problems or questions, contact your health care provider. What can I expect after the procedure? After the procedure, it is common to have:  Mild discomfort in the abdomen.  Sore throat. Women who have laparoscopy with pelvic examination may have mild cramping and fluid coming from the vagina for a few days after the procedure. Follow these instructions at home: Medicines  Take over-the-counter and prescription medicines only as told by your health care provider.  If you were prescribed an antibiotic medicine, take it as told by your health care provider. Do not stop taking the antibiotic even if you start to feel better. Driving  Do not drive for 24 hours if you were given a medicine to help you relax (sedative) during your procedure.  Do not drive or use heavy machinery while taking prescription pain medicine. Bathing  Do not take baths, swim, or use a hot tub until your health care provider approves. You may take showers. Incision care   Follow instructions from your health care provider about how to take care of your incisions. Make sure you: ? Wash your hands with soap and water before you change your bandage (dressing). If soap and water are not available, use hand sanitizer. ? Change your dressing as told by your health care provider. ? Leave stitches (sutures), skin glue, or adhesive strips in place. These skin closures may need to stay in place for 2 weeks or longer. If adhesive strip edges start to loosen and curl up, you may trim the loose edges. Do not remove adhesive strips completely unless your health care provider tells you to do that.  Check your incision areas every day for signs of infection. Check for: ? Redness, swelling, or pain. ? Fluid or  blood. ? Warmth. ? Pus or a bad smell. Activity  Return to your normal activities as told by your health care provider. Ask your health care provider what activities are safe for you.  Do not lift anything that is heavier than 10 lb (4.5 kg), or the limit that you are told, until your health care provider says that it is safe. General instructions  To prevent or treat constipation while you are taking prescription pain medicine, your health care provider may recommend that you: ? Drink enough fluid to keep your urine pale yellow. ? Take over-the-counter or prescription medicines. ? Eat foods that are high in fiber, such as fresh fruits and vegetables, whole grains, and beans. ? Limit foods that are high in fat and processed sugars, such as fried and sweet foods.  Do not use any products that contain nicotine or tobacco, such as cigarettes and e-cigarettes. If you need help quitting, ask your health care provider.  Keep all follow-up visits as told by your health care provider. This is important. Contact a health care provider if:  You develop shoulder pain.  You feel lightheaded or faint.  You are unable to pass gas or have a bowel movement.  You feel nauseous or you vomit.  You develop a rash.  You have redness, swelling, or pain around any incision.  You have fluid or blood coming from any incision.  Any incision feels warm to the touch.  You have pus or a bad smell coming from any incision.  You have a fever or chills. Get help   right away if:  You have severe pain.  You have vomiting that does not go away.  You have heavy bleeding from the vagina.  Any incision opens.  You have trouble breathing.  You have chest pain. Summary  After the procedure, it is common to have mild discomfort in the abdomen and a sore throat.  Check your incision areas every day for signs of infection.  Return to your normal activities as told by your health care provider. Ask  your health care provider what activities are safe for you. This information is not intended to replace advice given to you by your health care provider. Make sure you discuss any questions you have with your health care provider. Document Revised: 01/12/2017 Document Reviewed: 07/26/2016 Elsevier Patient Education  2020 Elsevier Inc.  

## 2019-04-24 NOTE — H&P (View-Only) (Signed)
PRE-OPERATIVE HISTORY AND PHYSICAL EXAM  HPI:  Brenda Wang is a 29 y.o. G3P3003 Patient's last menstrual period was 03/25/2019.; she is being admitted for surgery related to pelvic pain.  Her pain is localized to the deep pelvis area, described as constant, began about a year or more ago and its severity is described as severe. The pain radiates to the  back. She has these associated symptoms which include nausea.  She also has dyspareunia most times, deep.  Period have been irregular since last CS BTL. Pain is worse w periods. Patient has these modifiers which include CBD cream that make it better and activity that make it worse. Other meds no help.  Worse since NOV.  Context includes: she has had 3 prior CS, and BTL with the last.  Normal ultrasound.  No desire for future pregnancy.  PMHx: Past Medical History:  Diagnosis Date  . Anemia   . H/O: cesarean section    FTP and NRFHT  . PONV (postoperative nausea and vomiting)    nausea and vomiting after 2nd c-section  . Seizures ([place])    as a child, none recently.   Past Surgical History:  Procedure Laterality Date  . CESAREAN SECTION  01/23/2014  . CESAREAN SECTION N/A 08/10/2015   Procedure: CESAREAN SECTION;  Surgeon: Malachy Mood, MD;  Location: [facility] ORS;  Service: Obstetrics;  Laterality: N/A;  . CESAREAN SECTION WITH BILATERAL TUBAL LIGATION Bilateral 09/26/2016   Procedure: CESAREAN SECTION WITH BILATERAL TUBAL LIGATION;  Surgeon: Malachy Mood, MD;  Location: [facility] ORS;  Service: Obstetrics;  Laterality: Bilateral;  . THERAPEUTIC ABORTION    . TUBAL LIGATION  09/26/2016   [facility]  . WISDOM TOOTH EXTRACTION     Family History  Problem Relation Age of Onset  . Cancer Paternal Grandfather        colon ca  . Hypertension Paternal Grandmother   . Diabetes Paternal Grandmother    Social History   Tobacco Use  . Smoking status: Never Smoker  . Smokeless tobacco: Never Used  Substance Use Topics  . Alcohol  use: Yes    Alcohol/week: 4.0 standard drinks    Types: 4 Glasses of wine per week  . Drug use: No   No current outpatient medications on file. Allergies: Sulfa antibiotics  Review of Systems  Constitutional: Positive for malaise/fatigue. Negative for chills and fever.  HENT: Negative for congestion, sinus pain and sore throat.   Eyes: Negative for blurred vision and pain.  Respiratory: Negative for cough and wheezing.   Cardiovascular: Negative for chest pain and leg swelling.  Gastrointestinal: Positive for abdominal pain and nausea. Negative for constipation, diarrhea, heartburn and vomiting.  Genitourinary: Negative for dysuria, frequency, hematuria and urgency.  Musculoskeletal: Negative for back pain, joint pain, myalgias and neck pain.  Skin: Negative for itching and rash.  Neurological: Negative for dizziness, tremors and weakness.  Endo/Heme/Allergies: Does not bruise/bleed easily.  Psychiatric/Behavioral: Negative for depression. The patient is not nervous/anxious and does not have insomnia.     Objective: BP 120/80   Ht 5\' 4"  (1.626 m)   Wt 214 lb (97.1 kg)   LMP 03/25/2019   BMI 36.73 kg/m   Filed Weights   04/24/19 0834  Weight: 214 lb (97.1 kg)   Physical Exam Constitutional:      General: She is not in acute distress.    Appearance: She is well-developed.  HENT:     Head: Normocephalic and atraumatic. No laceration.  Right Ear: Hearing normal.     Left Ear: Hearing normal.     Mouth/Throat:     Pharynx: Uvula midline.  Eyes:     Pupils: Pupils are equal, round, and reactive to light.  Neck:     Thyroid: No thyromegaly.  Cardiovascular:     Rate and Rhythm: Normal rate and regular rhythm.     Heart sounds: No murmur. No friction rub. No gallop.   Pulmonary:     Effort: Pulmonary effort is normal. No respiratory distress.     Breath sounds: Normal breath sounds. No wheezing.  Chest:     Breasts:        Right: No mass, skin change or tenderness.          Left: No mass, skin change or tenderness.  Abdominal:     General: Bowel sounds are normal. There is no distension.     Palpations: Abdomen is soft.     Tenderness: There is no abdominal tenderness. There is no rebound.  Musculoskeletal:        General: Normal range of motion.     Cervical back: Normal range of motion and neck supple.  Neurological:     Mental Status: She is alert and oriented to person, place, and time.     Cranial Nerves: No cranial nerve deficit.  Skin:    General: Skin is warm and dry.  Psychiatric:        Judgment: Judgment normal.  Vitals reviewed.     Assessment: 1. Pelvic pain   2. Irregular menses   3. Dyspareunia due to medical condition in female   Plan Diagnostic laparoscopy and cystoscopy Also consideration for bilateral salpingectomy to remove any source of pain from tubal swelling, impingement.  No desire for future pregnancy (that would require tubal reversal) Necessary procedures for pain, such as endometriosis excision, counseled as well  I have had a careful discussion with this patient about all the options available and the risk/benefits of each. I have fully informed this patient that surgery may subject her to a variety of discomforts and risks: She understands that most patients have surgery with little difficulty, but problems can happen ranging from minor to fatal. These include nausea, vomiting, pain, bleeding, infection, poor healing, hernia, or formation of adhesions. Unexpected reactions may occur from any drug or anesthetic given. Unintended injury may occur to other pelvic or abdominal structures such as Fallopian tubes, ovaries, bladder, ureter (tube from kidney to bladder), or bowel. Nerves going from the pelvis to the legs may be injured. Any such injury may require immediate or later additional surgery to correct the problem. Excessive blood loss requiring transfusion is very unlikely but possible. Dangerous blood clots may form  in the legs or lungs. Physical and sexual activity will be restricted in varying degrees for an indeterminate period of time but most often 2-6 weeks.  Finally, she understands that it is impossible to list every possible undesirable effect and that the condition for which surgery is done is not always cured or significantly improved, and in rare cases may be even worse.Ample time was given to answer all questions.  Barnett Applebaum, MD, [facility], [facility] 04/24/2019  9:15 AM

## 2019-04-24 NOTE — Progress Notes (Signed)
  HPI: Pt has pelvic pain, worse w periods and w intercourse.  See prior note.  Ultrasound demonstrates no masses seen These findings are Pelvis normal  PMHx: She  has a past medical history of Anemia, H/O: cesarean section, PONV (postoperative nausea and vomiting), and Seizures ([place]). Also,  has a past surgical history that includes Therapeutic abortion; Cesarean section (01/23/2014); Wisdom tooth extraction; Cesarean section (N/A, 08/10/2015); Cesarean section with bilateral tubal ligation (Bilateral, 09/26/2016); and Tubal ligation (09/26/2016)., family history includes Cancer in her paternal grandfather; Diabetes in her paternal grandmother; Hypertension in her paternal grandmother.,  reports that she has never smoked. She has never used smokeless tobacco. She reports current alcohol use of about 4.0 standard drinks of alcohol per week. She reports that she does not use drugs.  She currently has no medications in their medication list. Also, is allergic to sulfa antibiotics.  Review of Systems  All other systems reviewed and are negative.   Objective: BP 120/80   Ht 5\' 4"  (1.626 m)   Wt 214 lb (97.1 kg)   LMP 03/25/2019   BMI 36.73 kg/m   Physical examination Constitutional NAD, Conversant  Skin No rashes, lesions or ulceration.   Extremities: Moves all appropriately.  Normal ROM for age. No lymphadenopathy.  Neuro: Grossly intact  Psych: Oriented to PPT.  Normal mood. Normal affect.   US PELVIS TRANSVAGINAL NON-OB (TV ONLY)  Result Date: 04/24/2019 Patient Name: Brenda Wang DOB: 16-Aug-1990 MRN: [id number] ULTRASOUND REPORT Location: [facility] Date of Service: 04/24/2019 Indications:Pelvic Pain Findings: The uterus is retroflexed and measures 11.7 x 4.5 x 3.5 cm. Echo texture is homogenous without evidence of focal masses. The Endometrium measures 6.1 mm. Right Ovary measures 2.2 x 1.3 x 1.7 cm. It is normal in appearance. Left Ovary measures 2.5 x 1.9 x 1.5 cm. It is  normal in appearance. Survey of the adnexa demonstrates no adnexal masses. There is no free fluid in the cul de sac. Impression: 1. Normal pelvic ultrasound. Recommendations: 1.Clinical correlation with the patient's History and Physical Exam. Gweneth Dimitri, RT Review of ULTRASOUND.    I have personally reviewed images and report of recent ultrasound done at [facility].    Plan of management to be discussed with patient. Barnett Applebaum, MD, [facility], [facility] 04/24/2019  8:52 AM   Assessment:  Pelvic pain  Irregular menses  Dyspareunia due to medical condition in female  Plan surgery for investigation, endometriosis or adhesions or tubal disease. Normal [place] discussed. Hormonal treatment as option discussed as well.  Ablation and hysterectomy to suppress periods and/or treat pelvic pain discussed.  Non-Gyn etiologies counseled as well  A total of 20 minutes were spent face-to-face with the patient as well as preparation, review, communication, and documentation during this encounter.   Barnett Applebaum, MD, [facility], [facility] 04/24/2019  9:07 AM

## 2019-04-28 ENCOUNTER — Telehealth: Payer: Self-pay | Admitting: Obstetrics & Gynecology

## 2019-04-28 NOTE — Telephone Encounter (Signed)
-----   Message from Gae Dry, MD sent at 04/24/2019  9:10 AM EST ----- Regarding: SURGERY Surgery Booking Request Patient Full Name:  Brenda Wang  MRN: [id number]  DOB: 09/08/1990  Surgeon: Hoyt Koch, MD  Requested Surgery Date and Time: ANY soon.  PREOP TODAY by self. Primary Diagnosis AND Code: R10.2, N94.19, N92.6 PELVIC PAIN, DYSPAREUNIA, IRREGULAR MENSES Secondary Diagnosis and Code:  Surgical Procedure: Diagnostic Laparoscopy, Cystoscopy L&D Notification: No Admission Status: same day surgery Length of Surgery: 40 min Special Case Needs: No H&P: No Phone Interview???:  Yes Interpreter: No Language:  Medical Clearance:  No Special Scheduling Instructions: no Any known health/anesthesia issues, diabetes, sleep apnea, latex allergy, defibrillator/pacemaker?: No Acuity: P3   (P1 highest, P2 delay may cause harm, P3 low, elective gyn, P4 lowest)

## 2019-04-28 NOTE — Telephone Encounter (Signed)
Called pt to offer surgery dates w Dr Kenton Kingfisher  DOS 4/6  H&P N/A - Dr Kenton Kingfisher already did during pt office visit  Covid - 4/2 8-10:[address], drive up and wear a mask. Adv pt to quar after testing until DOS  Adv of Pre admit phone appt - date/time will be on MyChart (active user)  Conf Medicaid is only insurance

## 2019-05-01 ENCOUNTER — Ambulatory Visit: Payer: Medicaid Other | Attending: Internal Medicine

## 2019-05-01 DIAGNOSIS — Z23 Encounter for immunization: Secondary | ICD-10-CM

## 2019-05-01 NOTE — Progress Notes (Signed)
   Covid-19 Vaccination Clinic  Name:  Brenda Wang    MRN: [id number] DOB: [birth year]  05/01/2019  [name] was observed post Covid-19 immunization for 15 minutes without incident. She was provided with Vaccine Information Sheet and instruction to access the V-Safe system.   [name] was instructed to call 911 with any severe reactions post vaccine: Marland Kitchen Difficulty breathing  . Swelling of face and throat  . A fast heartbeat  . A bad rash all over body  . Dizziness and weakness   Immunizations Administered    Name Date Dose VIS Date Route   Pfizer COVID-19 Vaccine 05/01/2019 11:45 AM 0.3 mL 01/24/2019 Intramuscular   Manufacturer: [place]   Lot: [id number]   [place]: [id number]

## 2019-05-09 ENCOUNTER — Encounter
Admission: RE | Admit: 2019-05-09 | Discharge: 2019-05-09 | Disposition: A | Discharge status: Home / Self Care | Payer: Medicaid Other | Source: Ambulatory Visit | Attending: Obstetrics & Gynecology | Admitting: Obstetrics & Gynecology

## 2019-05-09 ENCOUNTER — Other Ambulatory Visit: Payer: Self-pay

## 2019-05-09 DIAGNOSIS — Z01812 Encounter for preprocedural laboratory examination: Secondary | ICD-10-CM | POA: Diagnosis present

## 2019-05-09 HISTORY — DX: Headache, unspecified: R51.9

## 2019-05-09 HISTORY — DX: Anxiety disorder, unspecified: F41.9

## 2019-05-09 NOTE — Patient Instructions (Signed)
  Your procedure is scheduled on: Tuesday May 20, 2019 Report to Day Surgery. To find out your arrival time please call [phone] between 1PM - 3PM on Monday May 19, 2019.  Remember: Instructions that are not followed completely may result in serious medical risk,  up to and including death, or upon the discretion of your surgeon and anesthesiologist your  surgery may need to be rescheduled.     _X__ 1. Do not eat food after midnight the night before your procedure.                 No gum chewing or hard candies. You may drink clear liquids up to 2 hours                 before you are scheduled to arrive for your surgery- DO not drink clear                 liquids within 2 hours of the start of your surgery.                 Clear Liquids include:  water, apple juice without pulp, clear Gatorade, G2 or                  Gatorade Zero (avoid Red/Purple/Blue), Black Coffee or Tea (Do not add                 anything to coffee or tea).  __X__2.  On the morning of surgery brush your teeth with toothpaste and water, you                may rinse your mouth with mouthwash if you wish.  Do not swallow any toothpaste of mouthwash.     _X__ 3.  No Alcohol for 24 hours before or after surgery.   _X__ 4.  Do Not Smoke or use e-cigarettes For 24 Hours Prior to Your Surgery.                 Do not use any chewable tobacco products for at least 6 hours prior to                 surgery.  __x__  5.  Notify your doctor if there is any change in your medical condition      (cold, fever, infections).     Do not wear jewelry, make-up, hairpins, clips or nail polish. Do not wear lotions, powders, or perfumes. You may wear deodorant. Do not shave 48 hours prior to surgery. Men may shave face and neck. Do not bring valuables to the hospital.    [facility] is not responsible for any belongings or valuables.  Contacts, dentures or bridgework may not be worn into surgery. Leave  your suitcase in the car. After surgery it may be brought to your room. For patients admitted to the hospital, discharge time is determined by your treatment team.   Patients discharged the day of surgery will not be allowed to drive home.   Make arrangements for someone to be with you for the first 24 hours of your Same Day Discharge.  ____ Take these medicines the morning of surgery with A SIP OF WATER:    1. None   __x__ Stop Anti-inflammatories such as ibuprofen, Aleve, aspirin, naproxen and or BC powders.   __x__ Stop supplements until after surgery.    __x__ Do not add herbal supplements before your surgery.

## 2019-05-16 ENCOUNTER — Other Ambulatory Visit: Payer: Self-pay

## 2019-05-16 ENCOUNTER — Other Ambulatory Visit
Admission: RE | Admit: 2019-05-16 | Discharge: 2019-05-16 | Disposition: A | Discharge status: Home / Self Care | Payer: Medicaid Other | Source: Ambulatory Visit | Attending: Obstetrics & Gynecology | Admitting: Obstetrics & Gynecology

## 2019-05-16 DIAGNOSIS — Z01812 Encounter for preprocedural laboratory examination: Secondary | ICD-10-CM | POA: Diagnosis present

## 2019-05-16 DIAGNOSIS — Z20822 Contact with and (suspected) exposure to covid-19: Secondary | ICD-10-CM | POA: Diagnosis not present

## 2019-05-16 LAB — CBC
HCT: 42 % (ref 36.0–46.0)
Hemoglobin: 13.8 g/dL (ref 12.0–15.0)
MCH: 28.6 pg (ref 26.0–34.0)
MCHC: 32.9 g/dL (ref 30.0–36.0)
MCV: 87.1 fL (ref 80.0–100.0)
Platelets: 302 10*3/uL (ref 150–400)
RBC: 4.82 MIL/uL (ref 3.87–5.11)
RDW: 13.6 % (ref 11.5–15.5)
WBC: 6.8 10*3/uL (ref 4.0–10.5)
nRBC: 0 % (ref 0.0–0.2)

## 2019-05-16 LAB — TYPE AND SCREEN
ABO/RH(D): O POS
Antibody Screen: NEGATIVE

## 2019-05-16 LAB — SARS CORONAVIRUS 2 (TAT 6-24 HRS): SARS Coronavirus 2: NEGATIVE

## 2019-05-20 ENCOUNTER — Encounter
Admission: RE | Disposition: A | Discharge status: Home / Self Care | Payer: Self-pay | Source: Home / Self Care | Attending: Obstetrics & Gynecology

## 2019-05-20 ENCOUNTER — Telehealth: Payer: Self-pay

## 2019-05-20 ENCOUNTER — Ambulatory Visit: Payer: Medicaid Other | Admitting: Anesthesiology

## 2019-05-20 ENCOUNTER — Other Ambulatory Visit: Payer: Self-pay

## 2019-05-20 ENCOUNTER — Other Ambulatory Visit: Payer: Self-pay | Admitting: Obstetrics & Gynecology

## 2019-05-20 ENCOUNTER — Ambulatory Visit
Admission: RE | Admit: 2019-05-20 | Discharge: 2019-05-20 | Disposition: A | Discharge status: Home / Self Care | Payer: Medicaid Other | Attending: Obstetrics & Gynecology | Admitting: Obstetrics & Gynecology

## 2019-05-20 DIAGNOSIS — N736 Female pelvic peritoneal adhesions (postinfective): Secondary | ICD-10-CM | POA: Diagnosis not present

## 2019-05-20 DIAGNOSIS — N838 Other noninflammatory disorders of ovary, fallopian tube and broad ligament: Secondary | ICD-10-CM | POA: Insufficient documentation

## 2019-05-20 DIAGNOSIS — N926 Irregular menstruation, unspecified: Secondary | ICD-10-CM | POA: Diagnosis not present

## 2019-05-20 DIAGNOSIS — Z9851 Tubal ligation status: Secondary | ICD-10-CM | POA: Diagnosis not present

## 2019-05-20 DIAGNOSIS — R102 Pelvic and perineal pain unspecified side: Secondary | ICD-10-CM | POA: Diagnosis present

## 2019-05-20 DIAGNOSIS — N9412 Deep dyspareunia: Secondary | ICD-10-CM | POA: Insufficient documentation

## 2019-05-20 DIAGNOSIS — Z302 Encounter for sterilization: Secondary | ICD-10-CM

## 2019-05-20 DIAGNOSIS — N9419 Other specified dyspareunia: Secondary | ICD-10-CM | POA: Diagnosis present

## 2019-05-20 DIAGNOSIS — N925 Other specified irregular menstruation: Secondary | ICD-10-CM

## 2019-05-20 HISTORY — PX: LAPAROSCOPY: SHX197

## 2019-05-20 HISTORY — PX: LAPAROSCOPIC BILATERAL SALPINGECTOMY: SHX5889

## 2019-05-20 HISTORY — PX: CYSTOSCOPY: SHX5120

## 2019-05-20 HISTORY — PX: LAPAROSCOPIC LYSIS OF ADHESIONS: SHX5905

## 2019-05-20 LAB — URINE DRUG SCREEN, QUALITATIVE (ARMC ONLY)
Amphetamines, Ur Screen: NOT DETECTED
Barbiturates, Ur Screen: NOT DETECTED
Benzodiazepine, Ur Scrn: NOT DETECTED
Cannabinoid 50 Ng, Ur ~~LOC~~: NOT DETECTED
Cocaine Metabolite,Ur ~~LOC~~: NOT DETECTED
MDMA (Ecstasy)Ur Screen: NOT DETECTED
Methadone Scn, Ur: NOT DETECTED
Opiate, Ur Screen: NOT DETECTED
Phencyclidine (PCP) Ur S: NOT DETECTED
Tricyclic, Ur Screen: NOT DETECTED

## 2019-05-20 LAB — POCT PREGNANCY, URINE: Preg Test, Ur: NEGATIVE

## 2019-05-20 LAB — PREGNANCY, URINE: Preg Test, Ur: NEGATIVE

## 2019-05-20 SURGERY — LAPAROSCOPY, DIAGNOSTIC
Anesthesia: General

## 2019-05-20 MED ORDER — SUGAMMADEX SODIUM 200 MG/2ML IV SOLN
INTRAVENOUS | Status: DC | PRN
  Administered 2019-05-20: 300 mg via INTRAVENOUS

## 2019-05-20 MED ORDER — OXYCODONE HCL 5 MG PO TABS
ORAL_TABLET | ORAL | Status: AC
  Filled 2019-05-20: qty 1

## 2019-05-20 MED ORDER — OXYCODONE-ACETAMINOPHEN 5-325 MG PO TABS: 1.0000 | ORAL_TABLET | ORAL | 0 refills | Status: DC | PRN

## 2019-05-20 MED ORDER — OXYCODONE-ACETAMINOPHEN 5-325 MG PO TABS: 1.0000 | ORAL_TABLET | ORAL | Status: DC | PRN

## 2019-05-20 MED ORDER — GLYCOPYRROLATE 0.2 MG/ML IJ SOLN
INTRAMUSCULAR | Status: DC | PRN
  Administered 2019-05-20: .2 mg via INTRAVENOUS

## 2019-05-20 MED ORDER — MIDAZOLAM HCL 2 MG/2ML IJ SOLN
INTRAMUSCULAR | Status: DC | PRN
  Administered 2019-05-20: 2 mg via INTRAVENOUS

## 2019-05-20 MED ORDER — MEPERIDINE HCL 50 MG/ML IJ SOLN: 6.2500 mg | INTRAMUSCULAR | Status: DC | PRN

## 2019-05-20 MED ORDER — PROMETHAZINE HCL 25 MG/ML IJ SOLN: 6.2500 mg | INTRAMUSCULAR | Status: DC | PRN

## 2019-05-20 MED ORDER — LACTATED RINGERS IV SOLN: INTRAVENOUS | Status: DC

## 2019-05-20 MED ORDER — OXYCODONE HCL 5 MG PO TABS
5.0000 mg | ORAL_TABLET | Freq: Once | ORAL | Status: AC | PRN
  Administered 2019-05-20: 5 mg via ORAL

## 2019-05-20 MED ORDER — BUPIVACAINE HCL (PF) 0.5 % IJ SOLN
INTRAMUSCULAR | Status: DC | PRN
  Administered 2019-05-20: 5 mL

## 2019-05-20 MED ORDER — FAMOTIDINE 20 MG PO TABS
20.0000 mg | ORAL_TABLET | Freq: Once | ORAL | Status: AC
  Administered 2019-05-20: 11:00:00 20 mg via ORAL

## 2019-05-20 MED ORDER — DEXMEDETOMIDINE HCL IN NACL 200 MCG/50ML IV SOLN
INTRAVENOUS | Status: DC | PRN
  Administered 2019-05-20: 8 ug via INTRAVENOUS

## 2019-05-20 MED ORDER — ACETAMINOPHEN 650 MG RE SUPP
650.0000 mg | RECTAL | Status: DC | PRN
  Filled 2019-05-20: qty 1

## 2019-05-20 MED ORDER — ACETAMINOPHEN NICU IV SYRINGE 10 MG/ML
INTRAVENOUS | Status: AC
  Filled 2019-05-20: qty 1

## 2019-05-20 MED ORDER — ONDANSETRON HCL 4 MG/2ML IJ SOLN
INTRAMUSCULAR | Status: DC | PRN
  Administered 2019-05-20 (×2): 4 mg via INTRAVENOUS

## 2019-05-20 MED ORDER — FENTANYL CITRATE (PF) 100 MCG/2ML IJ SOLN
INTRAMUSCULAR | Status: DC | PRN
  Administered 2019-05-20 (×2): 50 ug via INTRAVENOUS

## 2019-05-20 MED ORDER — FAMOTIDINE 20 MG PO TABS
ORAL_TABLET | ORAL | Status: AC
  Filled 2019-05-20: qty 1

## 2019-05-20 MED ORDER — ONDANSETRON HCL 4 MG/2ML IJ SOLN
INTRAMUSCULAR | Status: AC
  Filled 2019-05-20: qty 4

## 2019-05-20 MED ORDER — FENTANYL CITRATE (PF) 100 MCG/2ML IJ SOLN
INTRAMUSCULAR | Status: AC
  Filled 2019-05-20: qty 2

## 2019-05-20 MED ORDER — MORPHINE SULFATE (PF) 4 MG/ML IV SOLN: 1.0000 mg | INTRAVENOUS | Status: DC | PRN

## 2019-05-20 MED ORDER — DEXAMETHASONE SODIUM PHOSPHATE 10 MG/ML IJ SOLN
INTRAMUSCULAR | Status: DC | PRN
  Administered 2019-05-20: 10 mg via INTRAVENOUS

## 2019-05-20 MED ORDER — MIDAZOLAM HCL 2 MG/2ML IJ SOLN
INTRAMUSCULAR | Status: AC
  Filled 2019-05-20: qty 2

## 2019-05-20 MED ORDER — DEXAMETHASONE SODIUM PHOSPHATE 10 MG/ML IJ SOLN
INTRAMUSCULAR | Status: AC
  Filled 2019-05-20: qty 1

## 2019-05-20 MED ORDER — OXYCODONE HCL 5 MG/5ML PO SOLN: 5.0000 mg | Freq: Once | ORAL | Status: AC | PRN

## 2019-05-20 MED ORDER — BUPIVACAINE HCL (PF) 0.5 % IJ SOLN
INTRAMUSCULAR | Status: AC
  Filled 2019-05-20: qty 30

## 2019-05-20 MED ORDER — ROCURONIUM BROMIDE 100 MG/10ML IV SOLN
INTRAVENOUS | Status: DC | PRN
  Administered 2019-05-20: 10 mg via INTRAVENOUS
  Administered 2019-05-20: 50 mg via INTRAVENOUS

## 2019-05-20 MED ORDER — ESMOLOL HCL 100 MG/10ML IV SOLN
INTRAVENOUS | Status: DC | PRN
  Administered 2019-05-20: 10 mg via INTRAVENOUS
  Administered 2019-05-20: 20 mg via INTRAVENOUS

## 2019-05-20 MED ORDER — KETOROLAC TROMETHAMINE 30 MG/ML IJ SOLN
INTRAMUSCULAR | Status: AC
  Filled 2019-05-20: qty 1

## 2019-05-20 MED ORDER — FENTANYL CITRATE (PF) 100 MCG/2ML IJ SOLN: 25.0000 ug | INTRAMUSCULAR | Status: DC | PRN

## 2019-05-20 MED ORDER — ACETAMINOPHEN 325 MG PO TABS: 650.0000 mg | ORAL_TABLET | ORAL | Status: DC | PRN

## 2019-05-20 MED ORDER — PROPOFOL 10 MG/ML IV BOLUS
INTRAVENOUS | Status: DC | PRN
  Administered 2019-05-20: 200 mg via INTRAVENOUS

## 2019-05-20 MED ORDER — ACETAMINOPHEN 10 MG/ML IV SOLN
INTRAVENOUS | Status: DC | PRN
  Administered 2019-05-20: 1000 mg via INTRAVENOUS

## 2019-05-20 MED ORDER — ESMOLOL HCL 100 MG/10ML IV SOLN
INTRAVENOUS | Status: AC
  Filled 2019-05-20: qty 10

## 2019-05-20 MED ORDER — GLYCOPYRROLATE 0.2 MG/ML IJ SOLN
INTRAMUSCULAR | Status: AC
  Filled 2019-05-20: qty 1

## 2019-05-20 MED ORDER — PROPOFOL 10 MG/ML IV BOLUS
INTRAVENOUS | Status: AC
  Filled 2019-05-20: qty 40

## 2019-05-20 MED ORDER — KETOROLAC TROMETHAMINE 30 MG/ML IJ SOLN
INTRAMUSCULAR | Status: DC | PRN
  Administered 2019-05-20: 30 mg via INTRAVENOUS

## 2019-05-20 MED ORDER — LIDOCAINE HCL (CARDIAC) PF 100 MG/5ML IV SOSY
PREFILLED_SYRINGE | INTRAVENOUS | Status: DC | PRN
  Administered 2019-05-20: 100 mg via INTRAVENOUS

## 2019-05-20 SURGICAL SUPPLY — 45 items
APPLICATOR COTTON TIP 6 STRL (MISCELLANEOUS) IMPLANT
APPLICATOR COTTON TIP 6IN STRL (MISCELLANEOUS) ×4
BAG URINE DRAIN 2000ML AR STRL (UROLOGICAL SUPPLIES) ×4 IMPLANT
BLADE SURG SZ11 CARB STEEL (BLADE) ×4 IMPLANT
CANISTER SUCT 1200ML W/VALVE (MISCELLANEOUS) ×4 IMPLANT
CATH FOLEY 2WAY  5CC 16FR (CATHETERS) ×2
CATH ROBINSON RED A/P 16FR (CATHETERS) ×4 IMPLANT
CATH URTH 16FR FL 2W BLN LF (CATHETERS) ×2 IMPLANT
CHLORAPREP W/TINT 26 (MISCELLANEOUS) ×4 IMPLANT
COVER WAND RF STERILE (DRAPES) ×4 IMPLANT
DEFOGGER SCOPE WARMER CLEARIFY (MISCELLANEOUS) ×2 IMPLANT
DERMABOND ADVANCED (GAUZE/BANDAGES/DRESSINGS) ×2
DERMABOND ADVANCED .7 DNX12 (GAUZE/BANDAGES/DRESSINGS) ×2 IMPLANT
DRSG TELFA 4X3 1S NADH ST (GAUZE/BANDAGES/DRESSINGS) IMPLANT
GLOVE BIO SURGEON STRL SZ8 (GLOVE) ×4 IMPLANT
GLOVE INDICATOR 8.0 STRL GRN (GLOVE) ×4 IMPLANT
GOWN STRL REUS W/ TWL LRG LVL3 (GOWN DISPOSABLE) ×2 IMPLANT
GOWN STRL REUS W/ TWL XL LVL3 (GOWN DISPOSABLE) ×2 IMPLANT
GOWN STRL REUS W/TWL LRG LVL3 (GOWN DISPOSABLE) ×2
GOWN STRL REUS W/TWL XL LVL3 (GOWN DISPOSABLE) ×2
IRRIGATION STRYKERFLOW (MISCELLANEOUS) IMPLANT
IRRIGATOR STRYKERFLOW (MISCELLANEOUS)
IV LACTATED RINGERS 1000ML (IV SOLUTION) IMPLANT
KIT PINK PAD W/HEAD ARE REST (MISCELLANEOUS) ×4
KIT PINK PAD W/HEAD ARM REST (MISCELLANEOUS) ×2 IMPLANT
LABEL OR SOLS (LABEL) ×4 IMPLANT
NEEDLE VERESS 14GA 120MM (NEEDLE) ×4 IMPLANT
NS IRRIG 500ML POUR BTL (IV SOLUTION) ×4 IMPLANT
PACK GYN LAPAROSCOPIC (MISCELLANEOUS) ×4 IMPLANT
PAD PREP 24X41 OB/GYN DISP (PERSONAL CARE ITEMS) ×4 IMPLANT
POUCH SPECIMEN RETRIEVAL 10MM (ENDOMECHANICALS) IMPLANT
SCISSORS METZENBAUM CVD 33 (INSTRUMENTS) ×4 IMPLANT
SET CYSTO W/LG BORE CLAMP LF (SET/KITS/TRAYS/PACK) ×4 IMPLANT
SET TUBE SMOKE EVAC HIGH FLOW (TUBING) ×4 IMPLANT
SHEARS HARMONIC ACE PLUS 36CM (ENDOMECHANICALS) ×2 IMPLANT
SLEEVE ENDOPATH XCEL 5M (ENDOMECHANICALS) IMPLANT
SPONGE GAUZE 2X2 8PLY STER LF (GAUZE/BANDAGES/DRESSINGS)
SPONGE GAUZE 2X2 8PLY STRL LF (GAUZE/BANDAGES/DRESSINGS) IMPLANT
STRAP SAFETY 5IN WIDE (MISCELLANEOUS) ×4 IMPLANT
SURGILUBE 2OZ TUBE FLIPTOP (MISCELLANEOUS) ×4 IMPLANT
SUT VIC AB 2-0 UR6 27 (SUTURE) IMPLANT
SUT VIC AB 4-0 PS2 18 (SUTURE) IMPLANT
SYR 10ML LL (SYRINGE) ×4 IMPLANT
TROCAR ENDO BLADELESS 11MM (ENDOMECHANICALS) IMPLANT
TROCAR XCEL NON-BLD 5MMX100MML (ENDOMECHANICALS) ×4 IMPLANT

## 2019-05-20 NOTE — Transfer of Care (Signed)
Immediate Anesthesia Transfer of Care Note  Patient: Taccara P Borchardt  Procedure(s) Performed: LAPAROSCOPY DIAGNOSTIC (N/A ) CYSTOSCOPY (N/A ) LAPAROSCOPIC BILATERAL SALPINGECTOMY LAPAROSCOPIC LYSIS OF ADHESIONS  Patient Location: PACU  Anesthesia Type:General  Level of Consciousness: drowsy, patient cooperative and responds to stimulation  Airway & Oxygen Therapy: Patient Spontanous Breathing and Patient connected to face mask oxygen  Post-op Assessment: Report given to RN and Post -op Vital signs reviewed and stable  Post vital signs: Reviewed and stable  Last Vitals:  Vitals Value Taken Time  BP 122/62 05/20/19 1239  Temp 36.4 C 05/20/19 1239  Pulse 87 05/20/19 1245  Resp 16 05/20/19 1245  SpO2 100 % 05/20/19 1245  Vitals shown include unvalidated device data.  Last Pain:  Vitals:   05/20/19 1239  TempSrc:   PainSc: Asleep         Complications: No apparent anesthesia complications

## 2019-05-20 NOTE — Discharge Instructions (Signed)
Laparoscopic Lysis of Abdominal Adhesions and Salpingectomy, Care After This sheet gives you information about how to care for yourself after your procedure. Your health care provider may also give you more specific instructions. If you have problems or questions, contact your health care provider. What can I expect after the procedure? After the procedure, it is common to have some pain around your incisions. Follow these instructions at home: Incision care   Follow instructions from your health care provider about how to take care of your incisions. Make sure you: ? Wash your hands with soap and water before you change your bandage (dressing). If soap and water are not available, use hand sanitizer. ? Change your dressing as told by your health care provider. ? Leave stitches (sutures), skin glue, or adhesive strips in place. These skin closures may need to stay in place for 2 weeks or longer. If adhesive strip edges start to loosen and curl up, you may trim the loose edges. Do not remove adhesive strips completely unless your health care provider tells you to do that.  Check your incision areas every day for signs of infection. Check for: ? Redness, swelling, or pain. ? Fluid or blood. ? Warmth. ? Pus or a bad smell.  Do not take baths, swim, or use a hot tub until your health care provider approves. Ask your health care provider if you may take showers. You may only be allowed to take sponge baths. Activity  Do not lift anything that is heavier than 10 lb (4.5 kg), or the limit that you are told, until your health care provider says that it is safe.  Return to your normal activities as told by your health care provider. Ask your health care provider what activities are safe for you.  Do not drive or use heavy machinery while taking prescription pain medicine. General instructions  Take over-the-counter and prescription medicines only as told by your health care provider.  After  your procedure, eat only a little at a time at first. Start with liquids. As your appetite improves, gradually return to eating solid foods.  If you are taking prescription pain medicine, take actions to prevent or treat constipation. Your health care provider may recommend that you: ? Drink enough fluid to keep your urine pale yellow. ? Eat foods that are high in fiber, such as fresh fruits and vegetables, whole grains, and beans. ? Limit foods that are high in fat and processed sugars, such as fried or sweet foods. ? Take an over-the-counter or prescription medicine for constipation.  Keep all follow-up visits as told by your health care provider. This is important. Contact a health care provider if:  You have a fever or chills.  You have redness, swelling, or pain at the site of your incisions.  You have fluid or blood coming from your incisions.  Your incisions feel warm to the touch.  You have pus or a bad smell coming from your incisions or the dressing.  Your pain gets worse.  You have a cough.  You have nausea and vomiting that does not go away after 3 hours. Get help right away if you have:  Severe pain in your abdomen or your chest.  Shortness of breath.  Nausea or vomiting that is severe or keeps coming back. Summary  After laparoscopic lysis of abdominal adhesions, it is common to have some pain around your incisions.  Check your incision areas every day for signs of infection. Watch for redness, worsening  pain, warmth, and drainage.  Be sure to keep all follow-up visits as told by your health care provider. This information is not intended to replace advice given to you by your health care provider. Make sure you discuss any questions you have with your health care provider. Document Revised: 03/06/2017 Document Reviewed: 01/17/2017 Elsevier Patient Education  2020 Elsevier Inc.\    AMBULATORY SURGERY  DISCHARGE INSTRUCTIONS   1) The drugs that you were  given will stay in your system until tomorrow so for the next 24 hours you should not:  A) Drive an automobile B) Make any legal decisions C) Drink any alcoholic beverage   2) You may resume regular meals tomorrow.  Today it is better to start with liquids and gradually work up to solid foods.  You may eat anything you prefer, but it is better to start with liquids, then soup and crackers, and gradually work up to solid foods.   3) Please notify your doctor immediately if you have any unusual bleeding, trouble breathing, redness and pain at the surgery site, drainage, fever, or pain not relieved by medication.    4) Additional Instructions:        Please contact your physician with any problems or Same Day Surgery at [phone], Monday through Friday 6 am to 4 pm, or [place] at [facility] number at [phone].

## 2019-05-20 NOTE — Telephone Encounter (Signed)
Pt had surgery today with [facility], the pharmacy the pain meds was sent to  They are out of this medicine,Percocet. [facility]. I sent this message to [facility] but not sure he will see this, Pt does not want to go all night with out pain meds,

## 2019-05-20 NOTE — Anesthesia Preprocedure Evaluation (Signed)
Anesthesia Evaluation  Patient identified by MRN, date of birth, ID band Patient awake    Reviewed: Allergy & Precautions, NPO status , Patient's Chart, lab work & pertinent test results  History of Anesthesia Complications Negative for: history of anesthetic complications  Airway Mallampati: III  TM Distance: >3 FB Neck ROM: Full    Dental no notable dental hx.    Pulmonary neg pulmonary ROS, neg sleep apnea, neg COPD,    breath sounds clear to auscultation- rhonchi (-) wheezing      Cardiovascular Exercise Tolerance: Good (-) hypertension(-) CAD, (-) Past MI, (-) Cardiac Stents and (-) CABG  Rhythm:Regular Rate:Normal - Systolic murmurs and - Diastolic murmurs    Neuro/Psych  Headaches, Seizures: childhood.  Anxiety    GI/Hepatic negative GI ROS, Neg liver ROS,   Endo/Other  negative endocrine ROSneg diabetes  Renal/GU negative Renal ROS     Musculoskeletal negative musculoskeletal ROS (+)   Abdominal (+) + obese,   Peds  Hematology  (+) anemia ,   Anesthesia Other Findings Past Medical History: No date: Anemia No date: Anxiety No date: H/O: cesarean section     Comment:  FTP and NRFHT No date: Headache     Comment:  migraines  No date: PONV (postoperative nausea and vomiting)     Comment:  nausea and vomiting after 2nd c-section No date: Seizures ([place])     Comment:  as a child, none recently.   Reproductive/Obstetrics                             Anesthesia Physical Anesthesia Plan  ASA: II  Anesthesia Plan: General   Post-op Pain Management:    Induction: Intravenous  PONV Risk Score and Plan: 2 and Ondansetron, Dexamethasone and Midazolam  Airway Management Planned: Oral ETT  Additional Equipment:   Intra-op Plan:   Post-operative Plan: Extubation in OR  Informed Consent: I have reviewed the patients History and Physical, chart, labs and discussed the procedure  including the risks, benefits and alternatives for the proposed anesthesia with the patient or authorized representative who has indicated his/her understanding and acceptance.     Dental advisory given  Plan Discussed with: CRNA and Anesthesiologist  Anesthesia Plan Comments:         Anesthesia Quick Evaluation

## 2019-05-20 NOTE — Op Note (Signed)
  Operative Note   05/20/2019  PRE-OP DIAGNOSIS: Desire for permanent sterilization  POST-OP DIAGNOSIS: same   PROCEDURE: Procedure(s): OPERATIVE LAPAROSCOPY CYSTOSCOPY LAPAROSCOPIC BILATERAL SALPINGECTOMY LAPAROSCOPIC LYSIS OF ADHESIONS   SURGEON: Barnett Applebaum, MD, FACOG  ANESTHESIA: Choice   ESTIMATED BLOOD LOSS: Min  COMPLICATIONS: None  DISPOSITION: PACU - hemodynamically stable.  CONDITION: stable  FINDINGS: Laparoscopic survey of the abdomen revealed a grossly adherent uterus to the anterior abdominal wall, portions of each tube present with some para-tubal cysts noted, and  Normal appearing ovaries, liver edge, gallbladder edge and appendix.  A moderate amount of intra-abdominal adhesions were noted as above and also with omentum to anterior abdominal wall.  PROCEDURE IN DETAIL: The patient was taken to the OR where anesthesia was administed. The patient was positioned in dorsal lithotomy in the [place]. The patient was then examined under anesthesia with the above noted findings. The patient was prepped and draped in the normal sterile fashion and bladder was drained using a red rubber cathater. Speculum exam normal, and a sponge stick was placed for manipulation purposes.  Attention was turned to the patient's abdomen where a 5 mm skin incision was made in the umbilical fold, after injection of local anesthesia. The Veress step needle was carefully introduced into the peritoneal cavity with placement confirmed using the hanging drop technique.  Pneumoperitoneum was obtained. The 5 mm port was then placed under direct visualization with the operative laparoscope  The above noted findings.  Trendelenburg.  A 5 mm trocar was then placed in the right lower quadrant under direct visualization with the laparoscope.  Right and left fallopian tubes (partial, from prior postpartum tubal ligation procedure where a midportion was prior removed) are identified and followed out to  their fimbria.  Each tube is excised utilizing the Harmonic scapel to include the fibria.  No injuries or bleeding was noted.  Lysis of adhesions to assess and excise along the uterus to abdominal wall interface is done to a moderate degree, however the entire uterus cannot be freed entirely from the adhesions and it is left still stuck to the anterior abdominal wall.  The adhesions are contiguous with the bladder as well and cannot descern one from the other at this time is a safe fashion.  Ureters are out of harms way for this procedure.  All instruments and ports were then removed from the abdomen after gas was expelled and patient was leveled.   The skin was closed with skin adhesive.   Cystoscopy is performed with hydrodistension of the bladder with 1000 mL saline.  No ulcerations or glomerulations seen.  Bladder drained.  The patient tolerated the procedure well. All counts were correct x 2. The patient was transferred to the recovery room awake, alert and breathing independently.  Barnett Applebaum, MD, [facility], [facility] 05/20/2019  12:35 PM

## 2019-05-20 NOTE — Anesthesia Procedure Notes (Signed)
Procedure Name: Intubation Date/Time: 05/20/2019 10:59 AM Performed by: Kelton Pillar, CRNA Pre-anesthesia Checklist: Patient identified, Emergency Drugs available, Suction available and Patient being monitored Patient Re-evaluated:Patient Re-evaluated prior to induction Oxygen Delivery Method: Circle system utilized Preoxygenation: Pre-oxygenation with 100% oxygen Induction Type: IV induction Ventilation: Mask ventilation without difficulty Laryngoscope Size: McGraph and 3 Tube type: Oral Tube size: 7.0 mm Number of attempts: 1 Airway Equipment and Method: Stylet,  Oral airway and Video-laryngoscopy Placement Confirmation: ETT inserted through vocal cords under direct vision,  positive ETCO2 and breath sounds checked- equal and bilateral Tube secured with: Tape Dental Injury: Teeth and Oropharynx as per pre-operative assessment

## 2019-05-20 NOTE — Interval H&P Note (Signed)
History and Physical Interval Note:  05/20/2019 10:21 AM  Brenda Wang  has presented today for surgery, with the diagnosis of R10.2, N94.19, N92.6 Pelvic Pain, Dyspareunia, Irregular Menses.  The various methods of treatment have been discussed with the patient and family. After consideration of risks, benefits and other options for treatment, the patient has consented to  Procedure(s): LAPAROSCOPY DIAGNOSTIC (N/A) CYSTOSCOPY (N/A) as a surgical intervention.  The patient's history has been reviewed, patient examined, no change in status, stable for surgery.  I have reviewed the patient's chart and labs.  Questions were answered to the patient's satisfaction.     Hoyt Koch

## 2019-05-20 NOTE — Telephone Encounter (Signed)
Can you send the percocet to [facility], [facility] is out of this rx

## 2019-05-20 NOTE — Telephone Encounter (Signed)
Sounds like this was taken care of by [facility].

## 2019-05-21 LAB — SURGICAL PATHOLOGY

## 2019-05-22 NOTE — Anesthesia Postprocedure Evaluation (Signed)
Anesthesia Post Note  Patient: Brenda Wang  Procedure(s) Performed: LAPAROSCOPY DIAGNOSTIC (N/A ) CYSTOSCOPY (N/A ) LAPAROSCOPIC BILATERAL SALPINGECTOMY LAPAROSCOPIC LYSIS OF ADHESIONS  Patient location during evaluation: PACU Anesthesia Type: General Level of consciousness: awake and alert Pain management: pain level controlled Vital Signs Assessment: post-procedure vital signs reviewed and stable Respiratory status: spontaneous breathing, nonlabored ventilation and respiratory function stable Cardiovascular status: blood pressure returned to baseline and stable Postop Assessment: no apparent nausea or vomiting Anesthetic complications: no     Last Vitals:  Vitals:   05/20/19 1335 05/20/19 1341  BP: 114/68 (!) 115/59  Pulse: 76 64  Resp: 13 20  Temp:  (!) 36.1 C  SpO2: 100% 100%    Last Pain:  Vitals:   05/20/19 1341  TempSrc: Temporal  PainSc: 2                  Tera Mater

## 2019-05-27 ENCOUNTER — Ambulatory Visit (INDEPENDENT_AMBULATORY_CARE_PROVIDER_SITE_OTHER): Payer: Medicaid Other | Admitting: Obstetrics & Gynecology

## 2019-05-27 ENCOUNTER — Encounter: Payer: Self-pay | Admitting: Obstetrics & Gynecology

## 2019-05-27 ENCOUNTER — Ambulatory Visit: Payer: Medicaid Other | Attending: Internal Medicine

## 2019-05-27 ENCOUNTER — Other Ambulatory Visit: Payer: Self-pay

## 2019-05-27 VITALS — BP 120/70 | Ht 64.0 in | Wt 206.0 lb

## 2019-05-27 DIAGNOSIS — R102 Pelvic and perineal pain: Secondary | ICD-10-CM

## 2019-05-27 DIAGNOSIS — Z48816 Encounter for surgical aftercare following surgery on the genitourinary system: Secondary | ICD-10-CM

## 2019-05-27 DIAGNOSIS — Z23 Encounter for immunization: Secondary | ICD-10-CM

## 2019-05-27 NOTE — Progress Notes (Signed)
  Postoperative Follow-up Patient presents post op from Lap adhesiolysis, cysto for pelvic pain, 1 week ago.  Subjective: Patient reports marked improvement in her preop symptoms. Eating a regular diet without difficulty. Pain is controlled with current analgesics. Medications being used: ibuprofen (OTC).  Activity: normal activities of daily living. Patient reports additional symptom's since surgery of None.  Objective: BP 120/70   Ht 5\' 4"  (1.626 m)   Wt 206 lb (93.4 kg)   LMP 05/20/2019   BMI 35.36 kg/m  Physical Exam Constitutional:      General: She is not in acute distress.    Appearance: She is well-developed.  Cardiovascular:     Rate and Rhythm: Normal rate.  Pulmonary:     Effort: Pulmonary effort is normal.  Abdominal:     General: There is no distension.     Palpations: Abdomen is soft.     Tenderness: There is no abdominal tenderness.     Comments: Incision Healing Well   Musculoskeletal:        General: Normal range of motion.  Neurological:     Mental Status: She is alert and oriented to person, place, and time.     Cranial Nerves: No cranial nerve deficit.  Skin:    General: Skin is warm and dry.     Assessment: s/p :  Lab Adhesiolysis Salpingectomy Cystoscopy progressing well  Plan: Patient has done well after surgery with no apparent complications.  I have discussed the post-operative course to date, and the expected progress moving forward.  The patient understands what complications to be concerned about.  I will see the patient in routine follow up, or sooner if needed.    Activity plan: No restriction. Counseled as to extensive nature of adhesions esp of uterus to ant abd wall, with likely benefit from hysterectomy if pain persists.  For me it would require TAH, but can also consider referral to minimally invasive clinic for possible robotic hysterectomy.  Hoyt Koch 05/27/2019, 4:45 PM

## 2019-05-27 NOTE — Progress Notes (Signed)
   Covid-19 Vaccination Clinic  Name:  Brenda Wang    MRN: [id number] DOB: 1990/06/27  05/27/2019  Ms. Lamp was observed post Covid-19 immunization for 15 minutes without incident. She was provided with Vaccine Information Sheet and instruction to access the V-Safe system.   Ms. Edsall was instructed to call 911 with any severe reactions post vaccine: Marland Kitchen Difficulty breathing  . Swelling of face and throat  . A fast heartbeat  . A bad rash all over body  . Dizziness and weakness   Immunizations Administered    Name Date Dose VIS Date Route   Pfizer COVID-19 Vaccine 05/27/2019  9:11 AM 0.3 mL 01/24/2019 Intramuscular   Manufacturer: [place]   Lot: [id number]   [place]: [id number]

## 2019-09-27 ENCOUNTER — Other Ambulatory Visit: Payer: Self-pay

## 2019-09-27 ENCOUNTER — Emergency Department
Admission: EM | Admit: 2019-09-27 | Discharge: 2019-09-27 | Disposition: A | Discharge status: Home / Self Care | Payer: 59 | Attending: Emergency Medicine | Admitting: Emergency Medicine

## 2019-09-27 ENCOUNTER — Emergency Department: Payer: 59

## 2019-09-27 DIAGNOSIS — T189XXA Foreign body of alimentary tract, part unspecified, initial encounter: Secondary | ICD-10-CM | POA: Insufficient documentation

## 2019-09-27 DIAGNOSIS — Y9389 Activity, other specified: Secondary | ICD-10-CM | POA: Insufficient documentation

## 2019-09-27 DIAGNOSIS — T18120A Food in esophagus causing compression of trachea, initial encounter: Secondary | ICD-10-CM | POA: Diagnosis not present

## 2019-09-27 DIAGNOSIS — Y9289 Other specified places as the place of occurrence of the external cause: Secondary | ICD-10-CM | POA: Diagnosis not present

## 2019-09-27 DIAGNOSIS — Z5321 Procedure and treatment not carried out due to patient leaving prior to being seen by health care provider: Secondary | ICD-10-CM | POA: Diagnosis not present

## 2019-09-27 DIAGNOSIS — Y998 Other external cause status: Secondary | ICD-10-CM | POA: Insufficient documentation

## 2019-09-27 DIAGNOSIS — X58XXXA Exposure to other specified factors, initial encounter: Secondary | ICD-10-CM | POA: Diagnosis not present

## 2019-09-27 NOTE — ED Triage Notes (Signed)
Feels like might have a piece of asparagus stuck in her throat.  Patient is able to drink water and soda without difficulty.  Patient is speaking in full sentences without difficulty at this time.

## 2019-09-27 NOTE — ED Notes (Signed)
Repeat VS obtained by this RN. This RN apologized for and explained delay. Pt noted to be maintaining own secretions without difficulty at this time, no spitting noted at this time.

## 2019-11-14 ENCOUNTER — Encounter (HOSPITAL_COMMUNITY): Payer: Self-pay

## 2019-11-14 ENCOUNTER — Emergency Department (HOSPITAL_COMMUNITY)
Admission: EM | Admit: 2019-11-14 | Discharge: 2019-11-14 | Disposition: A | Discharge status: Home / Self Care | Payer: Medicaid Other | Attending: Emergency Medicine | Admitting: Emergency Medicine

## 2019-11-14 ENCOUNTER — Other Ambulatory Visit: Payer: Self-pay

## 2019-11-14 DIAGNOSIS — G43909 Migraine, unspecified, not intractable, without status migrainosus: Secondary | ICD-10-CM | POA: Diagnosis not present

## 2019-11-14 DIAGNOSIS — R519 Headache, unspecified: Secondary | ICD-10-CM | POA: Diagnosis present

## 2019-11-14 LAB — CBC
HCT: 43.4 % (ref 36.0–46.0)
Hemoglobin: 14.1 g/dL (ref 12.0–15.0)
MCH: 28.9 pg (ref 26.0–34.0)
MCHC: 32.5 g/dL (ref 30.0–36.0)
MCV: 88.9 fL (ref 80.0–100.0)
Platelets: 290 10*3/uL (ref 150–400)
RBC: 4.88 MIL/uL (ref 3.87–5.11)
RDW: 13.7 % (ref 11.5–15.5)
WBC: 7.8 10*3/uL (ref 4.0–10.5)
nRBC: 0 % (ref 0.0–0.2)

## 2019-11-14 LAB — I-STAT BETA HCG BLOOD, ED (MC, WL, AP ONLY): I-stat hCG, quantitative: <5 m[IU]/mL (ref <5)

## 2019-11-14 LAB — BASIC METABOLIC PANEL
Anion gap: 10 (ref 5–15)
BUN: 7 mg/dL (ref 6–20)
CO2: 25 mmol/L (ref 22–32)
Calcium: 9.1 mg/dL (ref 8.9–10.3)
Chloride: 104 mmol/L (ref 98–111)
Creatinine, Ser: 0.95 mg/dL (ref 0.44–1.00)
GFR calc Af Amer: >60 mL/min (ref >60)
GFR calc non Af Amer: >60 mL/min (ref >60)
Glucose, Bld: 92 mg/dL (ref 70–99)
Potassium: 4.1 mmol/L (ref 3.5–5.1)
Sodium: 139 mmol/L (ref 135–145)

## 2019-11-14 MED ORDER — METOCLOPRAMIDE HCL 10 MG PO TABS: 10.0000 mg | ORAL_TABLET | Freq: Three times a day (TID) | ORAL | 0 refills | Status: DC | PRN

## 2019-11-14 MED ORDER — METOCLOPRAMIDE HCL 5 MG/ML IJ SOLN
10.0000 mg | Freq: Once | INTRAMUSCULAR | Status: AC
  Administered 2019-11-14: 10 mg via INTRAMUSCULAR
  Filled 2019-11-14: qty 2

## 2019-11-14 MED ORDER — KETOROLAC TROMETHAMINE 30 MG/ML IJ SOLN
15.0000 mg | Freq: Once | INTRAMUSCULAR | Status: AC
  Administered 2019-11-14: 15 mg via INTRAMUSCULAR
  Filled 2019-11-14: qty 1

## 2019-11-14 MED ORDER — DEXAMETHASONE SODIUM PHOSPHATE 10 MG/ML IJ SOLN
10.0000 mg | Freq: Once | INTRAMUSCULAR | Status: AC
  Administered 2019-11-14: 10 mg via INTRAMUSCULAR
  Filled 2019-11-14: qty 1

## 2019-11-14 NOTE — ED Triage Notes (Signed)
Pt reports migraine since this morning, pain was so bad that she reports she blacked out while at work today while sitting down. Pt reports light sensitivity. Pt a.o, nad noted at this time.

## 2019-11-14 NOTE — Discharge Instructions (Signed)
Take 800 mg (4 pills) of ibuprofen and reglan as needed for headache/migraine.  Keep a headache diary to identify triggers. Make sure you stay well hydrated with water.  Call the neurologist listed below for further evaluation.  Return to the ER if you develop fevers, severe worsening pain, new numbness or vision loss, or with any new, worsening, or concerning symptoms.

## 2019-11-14 NOTE — ED Provider Notes (Signed)
[facility] EMERGENCY DEPARTMENT Provider Note   CSN: [id number] Arrival date & time: 11/14/19  1434     History Chief Complaint  Patient presents with  . Migraine  . Loss of Consciousness    Brenda Wang is a 29 y.o. female presenting for evaluation of HA and photophobia.   Pt states she was at work when she developed a HA. She became "out of it" but did not have a full syncopal event. She reports associated nausea, no vomiting. associated phobophobia. She ha sa h/o migraines, states this feels typical for her migraines. No fevers, chills, nasal congestion, cp, sob, cough. No other medical problems, takes iron pills daily. She does not follow with neurology. She is not on a daily preventatives.   HPI     Past Medical History:  Diagnosis Date  . Anemia   . Anxiety   . H/O: cesarean section    FTP and NRFHT  . Headache    migraines   . PONV (postoperative nausea and vomiting)    nausea and vomiting after 2nd c-section  . Seizures ([place])    as a child, none recently.    Patient Active Problem List   Diagnosis Date Noted  . Pelvic pain 04/15/2019  . Dyspareunia due to medical condition in female 04/15/2019  . History of cesarean section 08/10/2015  . Irregular menses 01/28/2013    Past Surgical History:  Procedure Laterality Date  . CESAREAN SECTION  01/23/2014  . CESAREAN SECTION N/A 08/10/2015   Procedure: CESAREAN SECTION;  Surgeon: Malachy Mood, MD;  Location: [facility] ORS;  Service: Obstetrics;  Laterality: N/A;  . CESAREAN SECTION WITH BILATERAL TUBAL LIGATION Bilateral 09/26/2016   Procedure: CESAREAN SECTION WITH BILATERAL TUBAL LIGATION;  Surgeon: Malachy Mood, MD;  Location: [facility] ORS;  Service: Obstetrics;  Laterality: Bilateral;  . CYSTOSCOPY N/A 05/20/2019   Procedure: CYSTOSCOPY;  Surgeon: Gae Dry, MD;  Location: [facility] ORS;  Service: Gynecology;  Laterality: N/A;  . LAPAROSCOPIC BILATERAL SALPINGECTOMY  05/20/2019    Procedure: LAPAROSCOPIC BILATERAL SALPINGECTOMY;  Surgeon: Gae Dry, MD;  Location: [facility] ORS;  Service: Gynecology;;  . LAPAROSCOPIC LYSIS OF ADHESIONS  05/20/2019   Procedure: LAPAROSCOPIC LYSIS OF ADHESIONS;  Surgeon: Gae Dry, MD;  Location: [facility] OR[facility];  Service: Gynecology;;  . LAPAROSCOPY N/A 05/20/2019   Procedure: LAPAROSCOPY DIAGNOSTIC;  Surgeon: Gae Dry, MD;  Location: [facility] OR[facility];  Service: Gynecology;  Laterality: N/A;  . THERAPEUTIC ABORTION    . TONSILLECTOMY    . TUBAL LIGATION  09/26/2016   [facility]  . TYMPANOSTOMY TUBE PLACEMENT Bilateral   . WISDOM TOOTH EXTRACTION       OB History    Gravida  3   Para  3   Term  3   Preterm      AB      Living  3     SAB      TAB      Ectopic      Multiple  0   Live Births  3           Family History  Problem Relation Age of Onset  . Cancer Paternal Grandfather        colon ca  . Hypertension Paternal Grandmother   . Diabetes Paternal Grandmother     Social History   Tobacco Use  . Smoking status: Never Smoker  . Smokeless tobacco: Never Used  Vaping Use  . Vaping Use: Never used  Substance  Use Topics  . Alcohol use: Yes    Alcohol/week: 4.0 standard drinks    Types: 4 Glasses of wine per week  . Drug use: Yes    Types: Marijuana    Home Medications Prior to Admission medications   Medication Sig Start Date End Date Taking? Authorizing Provider  ferrous sulfate 325 (65 FE) MG tablet Take 325 mg by mouth daily with breakfast.    [provider]  metoCLOPramide (REGLAN) 10 MG tablet Take 1 tablet (10 mg total) by mouth every 8 (eight) hours as needed for nausea. 11/14/19   Amila Callies, PA-C  oxyCODONE-acetaminophen (PERCOCET/ROXICET) 5-325 MG tablet Take 1 tablet by mouth every 4 (four) hours as needed for moderate pain. Patient not taking: Reported on 05/27/2019 05/20/19   Gae Dry, MD    Allergies    Sulfa antibiotics  Review of Systems   Review of  Systems  Eyes: Positive for photophobia.  Gastrointestinal: Positive for nausea.  Neurological: Positive for headaches.  All other systems reviewed and are negative.   Physical Exam Updated Vital Signs BP (!) 111/58 (BP Location: Right Arm)   Pulse 82   Temp 98.6 F (37 C) (Oral)   Resp 16   Ht 5\' 4"  (1.626 m)   Wt 90.7 kg   LMP 10/29/2019   SpO2 100%   BMI 34.33 kg/m   Physical Exam Vitals and nursing note reviewed.  Constitutional:      General: She is not in acute distress.    Appearance: She is well-developed.     Comments: Resting in the bed in NAD  HENT:     Head: Normocephalic and atraumatic.  Eyes:     Extraocular Movements: Extraocular movements intact.     Conjunctiva/sclera: Conjunctivae normal.     Pupils: Pupils are equal, round, and reactive to light.  Cardiovascular:     Rate and Rhythm: Normal rate and regular rhythm.     Pulses: Normal pulses.  Pulmonary:     Effort: Pulmonary effort is normal. No respiratory distress.     Breath sounds: Normal breath sounds. No wheezing.  Abdominal:     General: There is no distension.     Palpations: Abdomen is soft. There is no mass.     Tenderness: There is no abdominal tenderness. There is no guarding or rebound.  Musculoskeletal:        General: Normal range of motion.     Cervical back: Normal range of motion and neck supple.  Skin:    General: Skin is warm and dry.     Capillary Refill: Capillary refill takes less than 2 seconds.  Neurological:     Mental Status: She is alert and oriented to person, place, and time.     Comments: No neuro deficits. Cn intact. Sensation inatact     ED Results / Procedures / Treatments   Labs (all labs ordered are listed, but only abnormal results are displayed) Labs Reviewed  BASIC METABOLIC PANEL  CBC  I-STAT BETA HCG BLOOD, ED ([facility], [facility], [facility] ONLY)    EKG None  Radiology No results found.  Procedures Procedures (including critical care time)  Medications  Ordered in ED Medications  metoCLOPramide (REGLAN) injection 10 mg (10 mg Intramuscular Given 11/14/19 1734)  ketorolac (TORADOL) 30 MG/ML injection 15 mg (15 mg Intramuscular Given 11/14/19 1733)  dexamethasone (DECADRON) injection 10 mg (10 mg Intramuscular Given 11/14/19 1737)    ED Course  I have reviewed the triage vital signs and  the nursing notes.  Pertinent labs & imaging results that were available during my care of the patient were reviewed by me and considered in my medical decision making (see chart for details).    MDM Rules/Calculators/A&P                          Pt presenting for evaluation of migraine. On exam, pt without neuro deficits. No trauma or signs of infection. She has not taken anything. States this feels typical for her migraines. Labs obtained from triage interpreted by me, electrolytes stable. hgb stable. ekg NSR. Will give HA cocktail and reassess.   On reassessment, pt reports pain is improved. will have pt f/u with neuro. discussed use to ibuprofen and reglan to use as needed. At this time, pt appears safe for d/c. Return precautions given. Pt states she understands and agrees to plan.    Final Clinical Impression(s) / ED Diagnoses Final diagnoses:  Migraine without status migrainosus, not intractable, unspecified migraine type    Rx / DC Orders ED Discharge Orders         Ordered    metoCLOPramide (REGLAN) 10 MG tablet  Every 8 hours PRN        11/14/19 1843           Franchot Heidelberg, PA-C 11/14/19 Ardeen Garland, MD 11/16/19 1505

## 2019-11-19 ENCOUNTER — Encounter: Payer: Self-pay | Admitting: Neurology

## 2020-01-27 ENCOUNTER — Encounter: Payer: Self-pay | Admitting: Neurology

## 2020-01-27 ENCOUNTER — Ambulatory Visit: Payer: Medicaid Other | Admitting: Neurology

## 2020-01-27 ENCOUNTER — Other Ambulatory Visit: Payer: Self-pay

## 2020-01-27 VITALS — BP 102/58 | HR 85 | Ht 64.0 in | Wt 217.0 lb

## 2020-01-27 DIAGNOSIS — G43709 Chronic migraine without aura, not intractable, without status migrainosus: Secondary | ICD-10-CM | POA: Diagnosis not present

## 2020-01-27 MED ORDER — NORTRIPTYLINE HCL 10 MG PO CAPS: 20.0000 mg | ORAL_CAPSULE | Freq: Every day | ORAL | 11 refills | Status: DC

## 2020-01-27 MED ORDER — ONDANSETRON 4 MG PO TBDP: 4.0000 mg | ORAL_TABLET | Freq: Three times a day (TID) | ORAL | 6 refills | Status: DC | PRN

## 2020-01-27 MED ORDER — SUMATRIPTAN SUCCINATE 50 MG PO TABS: 50.0000 mg | ORAL_TABLET | ORAL | 6 refills | Status: DC | PRN

## 2020-01-27 NOTE — Progress Notes (Signed)
Chief Complaint  Patient presents with  . New Patient (Initial Visit)    ED follow up. Reports history of migraines. She has a headache nearly every day. Ibuprofen is not helpful. She has never been on any preventive medications. She normally has dizziness, nausea and vomiting with her migraines. She had one event of blacking out while at work. States she came to within 3-4 minutes.     HISTORICAL  Brenda Wang is a 29 year old female, seen in request by her primary care nurse practitioner Jodelle Green, for evaluation of frequent migraine headache, initial evaluation was on January 27, 2020.  I reviewed and summarized the referring note. She had a history of iron deficiency anemia, on supplement,  She reported to history of migraine for many years, since high school, her typical migraine of bilateral temporal retro-orbital area pressure headaches, oftentimes associated with light noise sensitivity, nausea, dizziness, lasting 1 to 2 days.  Over the years, her migraine headache is gradual getting worse, now she reported migraine headaches about 2-3 times each week, tried over-the-counter ibuprofen, Tylenol, with limited help  Trigger for her migraines are light, too much computer screen time, sleep deprivation, weather changes, stress   REVIEW OF SYSTEMS: Full 14 system review of systems performed and notable only for as above All other review of systems were negative.  ALLERGIES: Allergies  Allergen Reactions  . Sulfa Antibiotics Hives    HOME MEDICATIONS: Current Outpatient Medications  Medication Sig Dispense Refill  . ferrous sulfate 325 (65 FE) MG tablet Take 325 mg by mouth daily with breakfast.    . Polyethylene Glycol 3350 (MIRALAX PO) Take by mouth every other day.     No current facility-administered medications for this visit.    PAST MEDICAL HISTORY: Past Medical History:  Diagnosis Date  . Anemia   . Anxiety   . H/O: cesarean section    FTP and  NRFHT  . Headache    migraines   . Migraine   . PONV (postoperative nausea and vomiting)    nausea and vomiting after 2nd c-section  . Seizures ([place])    as a child, none recently.    PAST SURGICAL HISTORY: Past Surgical History:  Procedure Laterality Date  . CESAREAN SECTION  01/23/2014  . CESAREAN SECTION N/A 08/10/2015   Procedure: CESAREAN SECTION;  Surgeon: Malachy Mood, MD;  Location: [facility];  Service: Obstetrics;  Laterality: N/A;  . CESAREAN SECTION WITH BILATERAL TUBAL LIGATION Bilateral 09/26/2016   Procedure: CESAREAN SECTION WITH BILATERAL TUBAL LIGATION;  Surgeon: Malachy Mood, MD;  Location: [facility];  Service: Obstetrics;  Laterality: Bilateral;  . CYSTOSCOPY N/A 05/20/2019   Procedure: CYSTOSCOPY;  Surgeon: Gae Dry, MD;  Location: [facility];  Service: Gynecology;  Laterality: N/A;  . LAPAROSCOPIC BILATERAL SALPINGECTOMY  05/20/2019   Procedure: LAPAROSCOPIC BILATERAL SALPINGECTOMY;  Surgeon: Gae Dry, MD;  Location: [facility];  Service: Gynecology;;  . LAPAROSCOPIC LYSIS OF ADHESIONS  05/20/2019   Procedure: LAPAROSCOPIC LYSIS OF ADHESIONS;  Surgeon: Gae Dry, MD;  Location: [facility];  Service: Gynecology;;  . LAPAROSCOPY N/A 05/20/2019   Procedure: LAPAROSCOPY DIAGNOSTIC;  Surgeon: Gae Dry, MD;  Location: [facility];  Service: Gynecology;  Laterality: N/A;  . THERAPEUTIC ABORTION    . TONSILLECTOMY    . TUBAL LIGATION  09/26/2016   [facility]  . TYMPANOSTOMY TUBE PLACEMENT Bilateral   . WISDOM TOOTH EXTRACTION      FAMILY HISTORY: Family History  Problem Relation Age of  Onset  . Anemia Mother   . Anxiety disorder Mother   . Kidney disease Father   . Colon cancer Paternal Grandfather   . Hypertension Paternal Grandmother   . Diabetes Paternal Grandmother     SOCIAL HISTORY: Social History   Socioeconomic History  . Marital status: Married    Spouse name: Not on file  . Number of children: 3  . Years of education: college   . Highest education level: Bachelor's degree (e.g., BA, AB, BS)  Occupational History  . Occupation: Estate agent at [employer]  Tobacco Use  . Smoking status: Never Smoker  . Smokeless tobacco: Never Used  Vaping Use  . Vaping Use: Never used  Substance and Sexual Activity  . Alcohol use: Yes    Comment: glass of wine daily  . Drug use: Yes    Types: Marijuana    Comment: socially  . Sexual activity: Yes    Birth control/protection: Surgical    Comment: pregnant  Other Topics Concern  . Not on file  Social History Narrative   Lives at home with her family.   Right-handed.   No daily use of caffeine.   Social Determinants of Health   Financial Resource Strain: Not on file  Food Insecurity: Not on file  Transportation Needs: Not on file  Physical Activity: Not on file  Stress: Not on file  Social Connections: Not on file  Intimate Partner Violence: Not on file     PHYSICAL EXAM   Vitals:   01/27/20 0939  BP: (!) 102/58  Pulse: 85  Weight: 217 lb (98.4 kg)  Height: 5\' 4"  (1.626 m)   Not recorded     Body mass index is 37.25 kg/m.  PHYSICAL EXAMNIATION:  Gen: NAD, conversant, well nourised, well groomed                     Cardiovascular: Regular rate rhythm, no peripheral edema, warm, nontender. Eyes: Conjunctivae clear without exudates or hemorrhage Neck: Supple, no carotid bruits. Pulmonary: Clear to auscultation bilaterally   NEUROLOGICAL EXAM:  MENTAL STATUS: Speech:    Speech is normal; fluent and spontaneous with normal comprehension.  Cognition:     Orientation to time, place and person     Normal recent and remote memory     Normal Attention span and concentration     Normal Language, naming, repeating,spontaneous speech     Fund of knowledge   CRANIAL NERVES: CN II: Visual fields are full to confrontation. Pupils are round equal and briskly reactive to light. CN III, IV, VI: extraocular movement are normal. No ptosis. CN V: Facial  sensation is intact to light touch CN VII: Face is symmetric with normal eye closure  CN VIII: Hearing is normal to causal conversation. CN IX, X: Phonation is normal. CN XI: Head turning and shoulder shrug are intact  MOTOR: There is no pronator drift of out-stretched arms. Muscle bulk and tone are normal. Muscle strength is normal.  REFLEXES: Reflexes are 2+ and symmetric at the biceps, triceps, knees, and ankles. Plantar responses are flexor.  SENSORY: Intact to light touch, pinprick and vibratory sensation are intact in fingers and toes.  COORDINATION: There is no trunk or limb dysmetria noted.  GAIT/STANCE: Posture is normal. Gait is steady with normal steps, base, arm swing, and turning. Heel and toe walking are normal. Tandem gait is normal.  Romberg is absent.   DIAGNOSTIC DATA (LABS, IMAGING, TESTING) - I reviewed patient records, labs, notes,  testing and imaging myself where available.   ASSESSMENT AND PLAN  Weslynn Ke Keady is a 29 y.o. female   Chronic migraine headaches  Start preventive medication nortriptyline 10 mg, may titrating to 20 mg every night as preventive medications  Imitrex as needed, may combine with Zofran, and NSAIDs   Marcial Pacas, M.D. Ph.D.  [facility] [address], [place], [place] [address] Ph: [phone] Fax: [phone]  CC:  Jodelle Green, FNP No address on file

## 2020-02-10 ENCOUNTER — Ambulatory Visit: Payer: Medicaid Other | Admitting: Neurology

## 2020-04-28 ENCOUNTER — Ambulatory Visit: Payer: Medicaid Other | Admitting: Neurology

## 2020-05-03 ENCOUNTER — Encounter: Payer: Self-pay | Admitting: *Deleted

## 2020-05-03 ENCOUNTER — Encounter: Payer: Self-pay | Admitting: Obstetrics & Gynecology

## 2020-05-03 ENCOUNTER — Ambulatory Visit (INDEPENDENT_AMBULATORY_CARE_PROVIDER_SITE_OTHER): Payer: BC Managed Care – PPO | Admitting: Obstetrics & Gynecology

## 2020-05-03 ENCOUNTER — Other Ambulatory Visit: Payer: Self-pay

## 2020-05-03 VITALS — BP 120/80 | Ht 64.0 in | Wt 217.0 lb

## 2020-05-03 DIAGNOSIS — K5909 Other constipation: Secondary | ICD-10-CM

## 2020-05-03 NOTE — Progress Notes (Signed)
   Constipation Patient is a 30 yo G9P3 AA F who complains of constipation. Onset was several months ago. Patient has been having occasional firm formed and sometimes bloody stools per week. Defecation has been difficult. Co-Morbid conditions:none and she had adhesions noted last year at laparoscopy with uterus to anterior abdominal wall. Symptoms have gradually worsened. Current Health Habits: Eating fiber? some, Exercise? yes - increased exercise to see if helps, and it did not, Adequate hydration? yes - also hydrated w water w no help. Current over the counter/prescription laxative: stimulant such as MiraLax sometimes 2-3 times weekly which has been somewhat effective.  No pain.  Reg cycles.  Also has bloating.  PMHx: She  has a past medical history of Anemia, Anxiety, H/O: cesarean section, Headache, Migraine, PONV (postoperative nausea and vomiting), and Seizures ([place]). Also,  has a past surgical history that includes Therapeutic abortion; Cesarean section (01/23/2014); Wisdom tooth extraction; Cesarean section (N/A, 08/10/2015); Cesarean section with bilateral tubal ligation (Bilateral, 09/26/2016); Tubal ligation (09/26/2016); Tonsillectomy; Tympanostomy tube placement (Bilateral); laparoscopy (N/A, 05/20/2019); Cystoscopy (N/A, 05/20/2019); Laparoscopic bilateral salpingectomy (05/20/2019); and Laparoscopic lysis of adhesions (05/20/2019)., family history includes Anemia in her mother; Anxiety disorder in her mother; Colon cancer in her paternal grandfather; Diabetes in her paternal grandmother; Hypertension in her paternal grandmother; Kidney disease in her father.,  reports that she has never smoked. She has never used smokeless tobacco. She reports current alcohol use. She reports current drug use. Drug: Marijuana.  She has a current medication list which includes the following prescription(s): ferrous sulfate, polyethylene glycol 3350, sumatriptan, nortriptyline, and ondansetron. Also, is allergic to sulfa  antibiotics.  Review of Systems  Constitutional: Negative for chills, fever and malaise/fatigue.  HENT: Negative for congestion, sinus pain and sore throat.   Eyes: Negative for blurred vision and pain.  Respiratory: Negative for cough and wheezing.   Cardiovascular: Negative for chest pain and leg swelling.  Gastrointestinal: Positive for abdominal pain and constipation. Negative for diarrhea, heartburn, nausea and vomiting.  Genitourinary: Negative for dysuria, frequency, hematuria and urgency.  Musculoskeletal: Negative for back pain, joint pain, myalgias and neck pain.  Skin: Negative for itching and rash.  Neurological: Negative for dizziness, tremors and weakness.  Endo/Heme/Allergies: Does not bruise/bleed easily.  Psychiatric/Behavioral: Negative for depression. The patient is not nervous/anxious and does not have insomnia.     Objective: BP 120/80   Ht 5\' 4"  (1.626 m)   Wt 217 lb (98.4 kg)   LMP 04/29/2020   BMI 37.25 kg/m  Physical Exam Constitutional:      General: She is not in acute distress.    Appearance: She is well-developed.  Musculoskeletal:        General: Normal range of motion.  Neurological:     Mental Status: She is alert and oriented to person, place, and time.  Skin:    General: Skin is warm and dry.  Vitals reviewed.     ASSESSMENT/PLAN:    Problem List Items Addressed This Visit    Visit Diagnoses    Chronic constipation     -   Colace and fiber advised -   Avoid chronic laxative use     Relevant Orders   Ambulatory referral to Gastroenterology to assess for etiology and to help manage      Barnett Applebaum, MD, [facility], [facility] 05/03/2020  4:43 PM

## 2020-05-03 NOTE — Patient Instructions (Signed)
Chronic Constipation Chronic constipation is a condition in which a person has three or fewer bowel movements a week, for 3 months or longer. This condition is especially common in older adults. What are the causes? Causes of chronic constipation may include:  Not drinking enough fluid, eating enough food or fiber, or getting enough physical activity.  Pregnancy.  A tear in the anus (anal fissure).  Blockage in the bowel (bowel obstruction).  Narrowing of the bowel (bowel stricture).  Having a long-term medical condition, such as: ? Diabetes, hypothyroidism, or iron-deficiency anemia. ? Stroke or spinal cord injury. ? Multiple sclerosis or Parkinson's disease. ? Colon cancer. ? Dementia. ? Inflammatory bowel disease (IBD), outward collapse of the rectum (rectal prolapse), or hemorrhoids.  Taking certain medicines, including: ? Narcotics. These are a certain type of prescription pain medicine. ? Antacids or iron supplements. ? Water pills (diuretics). ? Certain blood pressure medicines. ? Anti-seizure medicines. ? Antidepressants. ? Medicines for Parkinson's disease. Other causes of this condition may include:  Stress.  Problems in the nerves and muscles that control the movement of stool.  Weak or impaired pelvic floor muscles.   What increases the risk? You may be at higher risk for chronic constipation if:  You are older than age 87.  You are female.  You live in a long-term care facility.  You have a long-term disease.  You have a mental health disorder or eating disorder. What are the signs or symptoms? The main symptom of chronic constipation is having three or fewer bowel movements a week for several weeks. Other signs and symptoms may vary from person to person. These include:  Pushing hard (straining) to pass stool, or having hard or lumpy stools.  Painful bowel movements.  Having lower abdominal discomfort, such as cramps or bloating.  Being unable  to have a bowel movement when you feel the urge, or feeling like you still need to pass stool after a bowel movement.  Feeling that you have something in your rectum that is blocking or preventing bowel movements.  Seeing blood on the toilet paper or in your stool.  Worsening confusion (in older adults). How is this diagnosed? This condition may be diagnosed based on:  Your symptoms and medical history. You will be asked about your symptoms, lifestyle, diet, and any medicines that you are taking.  A physical exam. ? Your abdomen will be examined. ? A digital rectal exam may be done. For this exam, a health care provider places a lubricated, gloved finger into the rectum.  Tests to check for any underlying causes of your constipation. These may be ordered if you have bleeding in your rectum, weight loss, or a family history of colon cancer. In these cases, you may have: ? Imaging studies of the colon. These may include X-ray, ultrasound, or a CT scan. ? Blood tests. ? A procedure to examine the inside of your colon (colonoscopy). ? More specialized tests to check:  Whether your anal sphincter works well. This is a ring-shaped muscle that controls the closing of the anus.  How well food moves through your colon. ? Tests to measure the nerve signal in your pelvic floor muscles (electromyography). How is this treated? Treatment for chronic constipation depends on the cause. Most often, treatment starts with:  Being more active and getting regular exercise.  Drinking more fluids.  Adding fiber to your diet. Sources of fiber include fruits, vegetables, whole grains, and fiber supplements.  Using medicines such as stool softeners  or medicines that increase contractions in your digestive system (pro-motility agents).   COLACE TWICE A DAY  Training your pelvic muscles with biofeedback.  Surgery, if there is obstruction. Treatment may also include:  Stopping or changing some  medicines if they cause constipation.  Using a fiber supplement (bulk laxative) or stool softener.  Using a prescription laxative. This works by [employer] into your colon (osmotic laxative).  (SHORT TERM ONLY) You may also need to see a specialist who treats conditions of the digestive system (gastroenterologist).   Follow these instructions at home: Medicines  Take over-the-counter and prescription medicines only as told by your health care provider.  If you are taking a laxative, take it as told by your health care provider. Eating and drinking  Eat a balanced diet that includes enough fiber. Ask your health care provider to recommend a diet that is right for you.  Drink clear fluids, especially water. Avoid drinking alcohol, caffeine, and soda. These can make constipation worse.  Drink enough fluid to keep your urine pale yellow.   General instructions  Get some physical activity every day. Ask your health care provider what activities are safe for you.  Get colon cancer screenings as told by your health care provider.  Keep all follow-up visits as told by your health care provider. This is important. Contact a health care provider if you have:  Three or fewer bowel movements a week.  Stools that are hard or lumpy.  Blood on the toilet paper or in your stool after you have a bowel movement.  Unexplained weight loss.  Rectum (rectal) pain.  Stool leakage.  Nausea or vomiting. Get help right away if you have:  Rectal bleeding or you pass blood clots.  Severe rectal pain.  Body tissue that pushes out (protrudes) from your anus.  Severe pain or bloating (distension) in your abdomen.  Vomiting that you cannot control. Summary  Chronic constipation is a condition in which a person has three or fewer bowel movements a week, for 3 months or longer.  You may have a higher risk for this condition if you are an older adult, you are female, or you have a long-term  disease.  Treatment for this condition depends on the cause. Most treatments for chronic constipation include adding fiber to your diet, drinking more fluids, and getting more physical activity. You may also need to treat any underlying medical conditions or stop or change certain medicines if they cause constipation.  If lifestyle changes do not relieve constipation, your health care provider may recommend taking a laxative. This information is not intended to replace advice given to you by your health care provider. Make sure you discuss any questions you have with your health care provider. Document Revised: 12/18/2018 Document Reviewed: 12/18/2018 Elsevier Patient Education  [place].

## 2020-07-08 ENCOUNTER — Ambulatory Visit: Payer: Medicaid Other | Admitting: Neurology

## 2020-09-01 ENCOUNTER — Other Ambulatory Visit: Payer: Self-pay

## 2020-09-01 ENCOUNTER — Ambulatory Visit
Admission: EM | Admit: 2020-09-01 | Discharge: 2020-09-01 | Disposition: A | Discharge status: Home / Self Care | Payer: BC Managed Care – PPO | Attending: Sports Medicine | Admitting: Sports Medicine

## 2020-09-01 DIAGNOSIS — B349 Viral infection, unspecified: Secondary | ICD-10-CM | POA: Diagnosis present

## 2020-09-01 DIAGNOSIS — R059 Cough, unspecified: Secondary | ICD-10-CM | POA: Insufficient documentation

## 2020-09-01 DIAGNOSIS — R519 Headache, unspecified: Secondary | ICD-10-CM | POA: Insufficient documentation

## 2020-09-01 DIAGNOSIS — J09X2 Influenza due to identified novel influenza A virus with other respiratory manifestations: Secondary | ICD-10-CM | POA: Diagnosis present

## 2020-09-01 DIAGNOSIS — R509 Fever, unspecified: Secondary | ICD-10-CM | POA: Insufficient documentation

## 2020-09-01 DIAGNOSIS — R52 Pain, unspecified: Secondary | ICD-10-CM | POA: Insufficient documentation

## 2020-09-01 LAB — INFLUENZA A AND B ANTIGEN (CONVERTED LAB)
INFLUENZA A ANTIGEN, POC: POSITIVE — AB
INFLUENZA B ANTIGEN, POC: NEGATIVE

## 2020-09-01 MED ORDER — PROMETHAZINE-DM 6.25-15 MG/5ML PO SYRP: 5.0000 mL | ORAL_SOLUTION | Freq: Four times a day (QID) | ORAL | 0 refills | Status: DC | PRN

## 2020-09-01 MED ORDER — OSELTAMIVIR PHOSPHATE 75 MG PO CAPS: 75.0000 mg | ORAL_CAPSULE | Freq: Two times a day (BID) | ORAL | 0 refills | Status: DC

## 2020-09-01 NOTE — Discharge Instructions (Addendum)
As we discussed, your flu test was positive.  I will treat you with Tamiflu Plenty of rest, plenty fluids, Tylenol Motrin or some other fever reducing medicine around-the-clock so that you keep your fever down and if so then you will feel much better. Please see educational handouts. I prescribed Tamiflu and a cough medicine. If your symptoms worsen then you need to go to the ER.

## 2020-09-01 NOTE — ED Provider Notes (Signed)
[facility]    CSN: [id number] Arrival date & time: 09/01/20  1642      History   Chief Complaint Chief Complaint  Patient presents with   Influenza    Body aches   Dizziness   Headache    HPI Brenda Wang is a 30 y.o. female.   30 year old female who presents for evaluation of URI type symptoms.  Patient reports that her symptoms began yesterday and they include headache, lightheadedness, body aches, cough, fever, chills, body aches, and some loose stools.  Her 3 children tested positive for influenza shortly prior to arrival.  She denies any chest pain or shortness of breath.  No nausea or vomiting.  No urinary symptoms.  She currently does not have a primary care provider.  She stays at home with the children at the present time.  No other issues or problems are offered.  No red flag signs or symptoms elicited on history.   Past Medical History:  Diagnosis Date   Anemia    Anxiety    H/O: cesarean section    FTP and NRFHT   Headache    migraines    Migraine    PONV (postoperative nausea and vomiting)    nausea and vomiting after 2nd c-section   Seizures ([place])    as a child, none recently.    Patient Active Problem List   Diagnosis Date Noted   Chronic migraine w/o aura w/o status migrainosus, not intractable 01/27/2020   Pelvic pain 04/15/2019   Dyspareunia due to medical condition in female 04/15/2019   History of cesarean section 08/10/2015   Irregular menses 01/28/2013    Past Surgical History:  Procedure Laterality Date   CESAREAN SECTION  01/23/2014   CESAREAN SECTION N/A 08/10/2015   Procedure: CESAREAN SECTION;  Surgeon: Malachy Mood, MD;  Location: [facility];  Service: Obstetrics;  Laterality: N/A;   CESAREAN SECTION WITH BILATERAL TUBAL LIGATION Bilateral 09/26/2016   Procedure: CESAREAN SECTION WITH BILATERAL TUBAL LIGATION;  Surgeon: Malachy Mood, MD;  Location: [facility];  Service: Obstetrics;  Laterality: Bilateral;    CYSTOSCOPY N/A 05/20/2019   Procedure: CYSTOSCOPY;  Surgeon: Gae Dry, MD;  Location: [facility];  Service: Gynecology;  Laterality: N/A;   LAPAROSCOPIC BILATERAL SALPINGECTOMY  05/20/2019   Procedure: LAPAROSCOPIC BILATERAL SALPINGECTOMY;  Surgeon: Gae Dry, MD;  Location: [facility];  Service: Gynecology;;   LAPAROSCOPIC LYSIS OF ADHESIONS  05/20/2019   Procedure: LAPAROSCOPIC LYSIS OF ADHESIONS;  Surgeon: Gae Dry, MD;  Location: [facility];  Service: Gynecology;;   LAPAROSCOPY N/A 05/20/2019   Procedure: LAPAROSCOPY DIAGNOSTIC;  Surgeon: Gae Dry, MD;  Location: [facility];  Service: Gynecology;  Laterality: N/A;   THERAPEUTIC ABORTION     TONSILLECTOMY     TUBAL LIGATION  09/26/2016   [facility]   TYMPANOSTOMY TUBE PLACEMENT Bilateral    WISDOM TOOTH EXTRACTION      OB History     Gravida  3   Para  3   Term  3   Preterm      AB      Living  3      SAB      IAB      Ectopic      Multiple  0   Live Births  3            Home Medications    Prior to Admission medications   Medication Sig Start Date End Date Taking? Authorizing Provider  ferrous sulfate 325 (65 FE) MG tablet Take 325 mg by mouth daily with breakfast.   Yes [provider]  nortriptyline (PAMELOR) 10 MG capsule Take 2 capsules (20 mg total) by mouth at bedtime. 01/27/20  Yes Marcial Pacas, MD  ondansetron (ZOFRAN ODT) 4 MG disintegrating tablet Take 1 tablet (4 mg total) by mouth every 8 (eight) hours as needed. 01/27/20  Yes Marcial Pacas, MD  oseltamivir (TAMIFLU) 75 MG capsule Take 1 capsule (75 mg total) by mouth every 12 (twelve) hours. 09/01/20  Yes Verda Cumins, MD  Polyethylene Glycol 3350 (MIRALAX PO) Take by mouth every other day.   Yes [provider]  promethazine-dextromethorphan (PROMETHAZINE-DM) 6.25-15 MG/5ML syrup Take 5 mLs by mouth 4 (four) times daily as needed for cough. 09/01/20  Yes Verda Cumins, MD  SUMAtriptan (IMITREX) 50 MG tablet Take  1 tablet (50 mg total) by mouth every 2 (two) hours as needed. May repeat in 2 hours if headache persists or recurs. 01/27/20  Yes Marcial Pacas, MD    Family History Family History  Problem Relation Age of Onset   Anemia Mother    Anxiety disorder Mother    Kidney disease Father    Colon cancer Paternal Grandfather    Hypertension Paternal Grandmother    Diabetes Paternal Grandmother     Social History Social History   Tobacco Use   Smoking status: Never   Smokeless tobacco: Never  Vaping Use   Vaping Use: Never used  Substance Use Topics   Alcohol use: Yes    Comment: glass of wine daily   Drug use: Yes    Types: Marijuana    Comment: socially     Allergies   Sulfa antibiotics   Review of Systems Review of Systems  Constitutional:  Positive for chills and fever. Negative for activity change, appetite change, diaphoresis and fatigue.  HENT:  Positive for congestion. Negative for ear pain, postnasal drip, rhinorrhea, sinus pressure, sinus pain, sneezing and sore throat.   Eyes:  Negative for pain.  Respiratory:  Positive for cough and chest tightness. Negative for shortness of breath.   Cardiovascular:  Negative for chest pain and palpitations.  Gastrointestinal:  Positive for diarrhea. Negative for abdominal pain, nausea and vomiting.  Genitourinary:  Negative for dysuria.  Musculoskeletal:  Positive for myalgias. Negative for back pain and neck pain.  Skin:  Negative for color change, pallor, rash and wound.  Neurological:  Positive for light-headedness and headaches. Negative for dizziness, seizures, syncope and numbness.  All other systems reviewed and are negative.   Physical Exam Triage Vital Signs ED Triage Vitals  Enc Vitals Group     BP 09/01/20 1657 132/68     Pulse Rate 09/01/20 1657 86     Resp 09/01/20 1657 18     Temp 09/01/20 1657 (!) 101.1 F (38.4 C)     Temp Source 09/01/20 1657 Oral     SpO2 09/01/20 1657 99 %     Weight 09/01/20 1656 211  lb (95.7 kg)     Height 09/01/20 1656 5\' 4"  (1.626 m)     Head Circumference --      Peak Flow --      Pain Score 09/01/20 1656 9     Pain Loc --      Pain Edu? --      Excl. in [place]? --    No data found.  Updated Vital Signs BP 132/68 (BP Location: Left Arm)   Pulse 86  Temp (!) 101.1 F (38.4 C) (Oral)   Resp 18   Ht 5\' 4"  (1.626 m)   Wt 95.7 kg   SpO2 99%   BMI 36.22 kg/m   Visual Acuity Right Eye Distance:   Left Eye Distance:   Bilateral Distance:    Right Eye Near:   Left Eye Near:    Bilateral Near:     Physical Exam Vitals and nursing note reviewed.  Constitutional:      General: She is not in acute distress.    Appearance: Normal appearance. She is well-developed. She is ill-appearing. She is not toxic-appearing or diaphoretic.  HENT:     Head: Normocephalic and atraumatic.     Nose: Congestion present. No rhinorrhea.     Mouth/Throat:     Mouth: Mucous membranes are moist.     Pharynx: No oropharyngeal exudate or posterior oropharyngeal erythema.  Eyes:     General: No scleral icterus.    Extraocular Movements: Extraocular movements intact.     Right eye: Normal extraocular motion and no nystagmus.     Left eye: Normal extraocular motion.     Conjunctiva/sclera: Conjunctivae normal.     Pupils: Pupils are equal, round, and reactive to light. Pupils are equal.  Neck:     Meningeal: Brudzinski's sign and Kernig's sign absent.  Cardiovascular:     Rate and Rhythm: Normal rate and regular rhythm.     Pulses: Normal pulses.     Heart sounds: Normal heart sounds. No murmur heard.   No friction rub. No gallop.  Pulmonary:     Effort: Pulmonary effort is normal.     Breath sounds: Normal breath sounds. No stridor. No wheezing, rhonchi or rales.  Musculoskeletal:     Cervical back: Normal range of motion and neck supple. No rigidity.  Lymphadenopathy:     Cervical: Cervical adenopathy present.  Skin:    General: Skin is warm and dry.     Capillary  Refill: Capillary refill takes less than 2 seconds.  Neurological:     General: No focal deficit present.     Mental Status: She is alert and oriented to person, place, and time.     GCS: GCS eye subscore is 4. GCS verbal subscore is 5. GCS motor subscore is 6.     Cranial Nerves: No cranial nerve deficit.     Sensory: No sensory deficit.  Psychiatric:        Mood and Affect: Mood normal.        Speech: Speech normal.        Behavior: Behavior normal.     UC Treatments / Results  Labs (all labs ordered are listed, but only abnormal results are displayed) Labs Reviewed  INFLUENZA A AND B ANTIGEN (CONVERTED LAB) - Abnormal; Notable for the following components:      Result Value   INFLUENZA A ANTIGEN, POC POSITIVE (*)    All other components within normal limits  POC INFLUENZA A AND B ANTIGEN (URGENT CARE ONLY)    EKG   Radiology No results found.  Procedures Procedures (including critical care time)  Medications Ordered in UC Medications - No data to display  Initial Impression / Assessment and Plan / UC Course  I have reviewed the triage vital signs and the nursing notes.  Pertinent labs & imaging results that were available during my care of the patient were reviewed by me and considered in my medical decision making (see chart for details).  Clinical impression:  1.  Influenza-like illness with influenza exposure 2.  Headache due to viral infection 3.  Febrile illness 4.  Cough 5.  Nasal congestion 6.  Body aches and myalgias  Treatment plan: 1.  The findings and treatment plan were discussed in detail with the patient.  Patient was in agreement. 2.  Recommended getting a influenza test.  She was positive. 3.  Went ahead and treated her with Tamiflu and sent that to her pharmacy. 4.  Also sent in a cough medicine. 5.  Educational handouts provided. 6.  Plenty of rest, plenty fluids, Tylenol or Motrin or some other fever reducing medicine around-the-clock to  keep her fever down as she will certainly feel better. 7.  Since she does not have a PCP if her symptoms persist she should come back here, go to another urgent care or go to the ER.  If she is worse then she should go to the ER.  She voiced verbal understanding. 8.  She was stable upon discharge and she will follow-up here as needed.    Final Clinical Impressions(s) / UC Diagnoses   Final diagnoses:  Influenza due to identified novel influenza A virus with other respiratory manifestations  Headache due to viral infection  Febrile illness, acute  Cough  Body aches     Discharge Instructions      As we discussed, your flu test was positive.  I will treat you with Tamiflu Plenty of rest, plenty fluids, Tylenol Motrin or some other fever reducing medicine around-the-clock so that you keep your fever down and if so then you will feel much better. Please see educational handouts. I prescribed Tamiflu and a cough medicine. If your symptoms worsen then you need to go to the ER.     ED Prescriptions     Medication Sig Dispense Auth. Provider   promethazine-dextromethorphan (PROMETHAZINE-DM) 6.25-15 MG/5ML syrup Take 5 mLs by mouth 4 (four) times daily as needed for cough. 180 mL Verda Cumins, MD   oseltamivir (TAMIFLU) 75 MG capsule Take 1 capsule (75 mg total) by mouth every 12 (twelve) hours. 10 capsule Verda Cumins, MD      PDMP not reviewed this encounter.   Verda Cumins, MD 09/01/20 [phone]

## 2020-09-01 NOTE — ED Triage Notes (Signed)
Pt's kids tested positive today for the Flu, states she is having similar SX, started yesterday.

## 2020-09-08 ENCOUNTER — Ambulatory Visit: Payer: Medicaid Other | Admitting: Neurology

## 2020-10-11 ENCOUNTER — Ambulatory Visit: Payer: BC Managed Care – PPO | Admitting: Gastroenterology

## 2020-10-11 ENCOUNTER — Telehealth: Payer: Self-pay | Admitting: Gastroenterology

## 2020-10-11 NOTE — Telephone Encounter (Signed)
  LVM for patient to call our office. Had to change office visit to virtual visit. Dr.Anna will not be in the office today.

## 2020-10-20 ENCOUNTER — Other Ambulatory Visit: Payer: Self-pay

## 2020-10-20 ENCOUNTER — Ambulatory Visit (INDEPENDENT_AMBULATORY_CARE_PROVIDER_SITE_OTHER): Payer: BC Managed Care – PPO | Admitting: Gastroenterology

## 2020-10-20 ENCOUNTER — Other Ambulatory Visit: Payer: Self-pay | Admitting: Gastroenterology

## 2020-10-20 ENCOUNTER — Encounter: Payer: Self-pay | Admitting: Gastroenterology

## 2020-10-20 VITALS — BP 116/62 | HR 92 | Temp 98.1°F | Ht 64.0 in | Wt 217.0 lb

## 2020-10-20 DIAGNOSIS — R194 Change in bowel habit: Secondary | ICD-10-CM

## 2020-10-20 DIAGNOSIS — K5909 Other constipation: Secondary | ICD-10-CM

## 2020-10-20 MED ORDER — SUTAB 1479-225-188 MG PO TABS: ORAL_TABLET | ORAL | 0 refills | Status: DC

## 2020-10-20 NOTE — Progress Notes (Signed)
Brenda Bellows MD, MRCP(U.K) [place]  [place], [place] [address]  Main: [phone]  Fax: [phone]   Gastroenterology Consultation  Referring Provider:     Jodelle Green, FNP Primary Care Physician:  Pcp, No Primary Gastroenterologist:  Dr. Jonathon Wang  Reason for Consultation:    Constipation        HPI:   Brenda Wang is a 30 y.o. y/o female referred for chronic constipation  08/17/2020: CBC, TSH, CMP normal Over the past few months she has developed severe constipation having a bowel movement only 1-2 times a week.  Very hard in nature.  He has had 3 C-sections and has had prior surgery for release of adhesions attributed to scar tissue.  Grandfather had colon cancer.  She never had a colonoscopy.  No change in the shape of stool.  Tried over-the-counter medications for constipation which has not worked.  No weight loss.    Past Medical History:  Diagnosis Date   Anemia    Anxiety    H/O: cesarean section    FTP and NRFHT   Headache    migraines    Migraine    PONV (postoperative nausea and vomiting)    nausea and vomiting after 2nd c-section   Seizures ([place])    as a child, none recently.    Past Surgical History:  Procedure Laterality Date   CESAREAN SECTION  01/23/2014   CESAREAN SECTION N/A 08/10/2015   Procedure: CESAREAN SECTION;  Surgeon: Malachy Mood, MD;  Location: [facility] ORS;  Service: Obstetrics;  Laterality: N/A;   CESAREAN SECTION WITH BILATERAL TUBAL LIGATION Bilateral 09/26/2016   Procedure: CESAREAN SECTION WITH BILATERAL TUBAL LIGATION;  Surgeon: Malachy Mood, MD;  Location: [facility];  Service: Obstetrics;  Laterality: Bilateral;   CYSTOSCOPY N/A 05/20/2019   Procedure: CYSTOSCOPY;  Surgeon: Gae Dry, MD;  Location: [facility];  Service: Gynecology;  Laterality: N/A;   LAPAROSCOPIC BILATERAL SALPINGECTOMY  05/20/2019   Procedure: LAPAROSCOPIC BILATERAL SALPINGECTOMY;  Surgeon: Gae Dry, MD;  Location:  [facility];  Service: Gynecology;;   LAPAROSCOPIC LYSIS OF ADHESIONS  05/20/2019   Procedure: LAPAROSCOPIC LYSIS OF ADHESIONS;  Surgeon: Gae Dry, MD;  Location: [facility];  Service: Gynecology;;   LAPAROSCOPY N/A 05/20/2019   Procedure: LAPAROSCOPY DIAGNOSTIC;  Surgeon: Gae Dry, MD;  Location: [facility];  Service: Gynecology;  Laterality: N/A;   THERAPEUTIC ABORTION     TONSILLECTOMY     TUBAL LIGATION  09/26/2016   [facility]   TYMPANOSTOMY TUBE PLACEMENT Bilateral    WISDOM TOOTH EXTRACTION      Prior to Admission medications   Medication Sig Start Date End Date Taking? Authorizing Provider  ferrous sulfate 325 (65 FE) MG tablet Take 325 mg by mouth daily with breakfast.    [provider]  nortriptyline (PAMELOR) 10 MG capsule Take 2 capsules (20 mg total) by mouth at bedtime. 01/27/20   Marcial Pacas, MD  ondansetron (ZOFRAN ODT) 4 MG disintegrating tablet Take 1 tablet (4 mg total) by mouth every 8 (eight) hours as needed. 01/27/20   Marcial Pacas, MD  oseltamivir (TAMIFLU) 75 MG capsule Take 1 capsule (75 mg total) by mouth every 12 (twelve) hours. 09/01/20   Verda Cumins, MD  Polyethylene Glycol 3350 (MIRALAX PO) Take by mouth every other day.    [provider]  promethazine-dextromethorphan (PROMETHAZINE-DM) 6.25-15 MG/5ML syrup Take 5 mLs by mouth 4 (four) times daily as needed for cough. 09/01/20  Verda Cumins, MD  SUMAtriptan (IMITREX) 50 MG tablet Take 1 tablet (50 mg total) by mouth every 2 (two) hours as needed. May repeat in 2 hours if headache persists or recurs. 01/27/20   Marcial Pacas, MD  venlafaxine XR (EFFEXOR-XR) 37.5 MG 24 hr capsule Take 37.5 mg by mouth daily. 09/01/20   [provider]    Family History  Problem Relation Age of Onset   Anemia Mother    Anxiety disorder Mother    Kidney disease Father    Colon cancer Paternal Grandfather    Hypertension Paternal Grandmother    Diabetes Paternal Grandmother      Social History    Tobacco Use   Smoking status: Never   Smokeless tobacco: Never  Vaping Use   Vaping Use: Never used  Substance Use Topics   Alcohol use: Yes    Comment: glass of wine daily   Drug use: Yes    Types: Marijuana    Comment: socially    Allergies as of 10/20/2020 - Review Complete 10/20/2020  Allergen Reaction Noted   Sulfa antibiotics Hives 01/28/2013    Review of Systems:    All systems reviewed and negative except where noted in HPI.   Physical Exam:  BP 116/62   Pulse 92   Temp 98.1 F (36.7 C) (Oral)   Ht '5\' 4"'$  (1.626 m)   Wt 217 lb (98.4 kg)   BMI 37.25 kg/m  No LMP recorded. Psych:  Alert and cooperative. Normal mood and affect. General:   Alert,  Well-developed, well-nourished, pleasant and cooperative in NAD Head:  Normocephalic and atraumatic. Eyes:  Sclera clear, no icterus.   Conjunctiva pink. Ears:  Normal auditory acuity. Lungs:  Respirations even and unlabored.  Clear throughout to auscultation.   No wheezes, crackles, or rhonchi. No acute distress. Heart:  Regular rate and rhythm; no murmurs, clicks, rubs, or gallops. Abdomen:  Normal bowel sounds.  No bruits.  Soft, non-tender and non-distended without masses, hepatosplenomegaly or hernias noted.  No guarding or rebound tenderness.    Neurologic:  Alert and oriented x3;  grossly normal neurologically. Psych:  Alert and cooperative. Normal mood and affect.  Imaging Studies: No results found.  Assessment and Plan:   Brenda Wang Reach is a 30 y.o. y/o female has been referred for chronic constipation with change in bowel habits over the past few months the constipation could be related to scar tissue but with a family history of colon cancer in a grandfather and her father requiring a colonoscopy very often would think that it would be best to perform a colonoscopy to evaluate further  Plan 1.  High-fiber diet patient information provided 2.  Diagnostic colonoscopy 3.  Commence on Trulance for 2  weeks with samples provided if it works she has been given instructions to call our office and get a prescription   I have discussed alternative options, risks & benefits,  which include, but are not limited to, bleeding, infection, perforation,respiratory complication & drug reaction.  The patient agrees with this plan & written consent will be obtained.    Follow up in 8 weeks  Dr Brenda Bellows MD,MRCP(U.K)

## 2020-10-20 NOTE — Patient Instructions (Signed)
High-Fiber Eating Plan °Fiber, also called dietary fiber, is a type of carbohydrate. It is found foods such as fruits, vegetables, whole grains, and beans. A high-fiber diet can have many health benefits. Your health care provider may recommend a high-fiber diet to help: °Prevent constipation. Fiber can make your bowel movements more regular. °Lower your cholesterol. °Relieve the following conditions: °Inflammation of veins in the anus (hemorrhoids). °Inflammation of specific areas of the digestive tract (uncomplicated diverticulosis). °A problem of the large intestine, also called the colon, that sometimes causes pain and diarrhea (irritable bowel syndrome, or IBS). °Prevent overeating as part of a weight-loss plan. °Prevent heart disease, type 2 diabetes, and certain cancers. °What are tips for following this plan? °Reading food labels ° °Check the nutrition facts label on food products for the amount of dietary fiber. Choose foods that have 5 grams of fiber or more per serving. °The goals for recommended daily fiber intake include: °Men (age 50 or younger): 34-38 g. °Men (over age 50): 28-34 g. °Women (age 50 or younger): 25-28 g. °Women (over age 50): 22-25 g. °Your daily fiber goal is _____________ g. °Shopping °Choose whole fruits and vegetables instead of processed forms, such as apple juice or applesauce. °Choose a wide variety of high-fiber foods such as avocados, lentils, oats, and kidney beans. °Read the nutrition facts label of the foods you choose. Be aware of foods with added fiber. These foods often have high sugar and sodium amounts per serving. °Cooking °Use whole-grain flour for baking and cooking. °Cook with brown rice instead of white rice. °Meal planning °Start the day with a breakfast that is high in fiber, such as a cereal that contains 5 g of fiber or more per serving. °Eat breads and cereals that are made with whole-grain flour instead of refined flour or white flour. °Eat brown rice, bulgur  wheat, or millet instead of white rice. °Use beans in place of meat in soups, salads, and pasta dishes. °Be sure that half of the grains you eat each day are whole grains. °General information °You can get the recommended daily intake of dietary fiber by: °Eating a variety of fruits, vegetables, grains, nuts, and beans. °Taking a fiber supplement if you are not able to take in enough fiber in your diet. It is better to get fiber through food than from a supplement. °Gradually increase how much fiber you consume. If you increase your intake of dietary fiber too quickly, you may have bloating, cramping, or gas. °Drink plenty of water to help you digest fiber. °Choose high-fiber snacks, such as berries, raw vegetables, nuts, and popcorn. °What foods should I eat? °Fruits °Berries. Pears. Apples. Oranges. Avocado. Prunes and raisins. Dried figs. °Vegetables °Sweet potatoes. Spinach. Kale. Artichokes. Cabbage. Broccoli. Cauliflower. Green peas. Carrots. Squash. °Grains °Whole-grain breads. Multigrain cereal. Oats and oatmeal. Brown rice. Barley. Bulgur wheat. Millet. Quinoa. Bran muffins. Popcorn. Rye wafer crackers. °Meats and other proteins °Navy beans, kidney beans, and pinto beans. Soybeans. Split peas. Lentils. Nuts and seeds. °Dairy °Fiber-fortified yogurt. °Beverages °Fiber-fortified soy milk. Fiber-fortified orange juice. °Other foods °Fiber bars. °The items listed above may not be a complete list of recommended foods and beverages. Contact a dietitian for more information. °What foods should I avoid? °Fruits °Fruit juice. Cooked, strained fruit. °Vegetables °Fried potatoes. Canned vegetables. Well-cooked vegetables. °Grains °White bread. Pasta made with refined flour. White rice. °Meats and other proteins °Fatty cuts of meat. Fried chicken or fried fish. °Dairy °Milk. Yogurt. Cream cheese. Sour cream. °Fats and   oils °Butters. °Beverages °Soft drinks. °Other foods °Cakes and pastries. °The items listed above may  not be a complete list of foods and beverages to avoid. Talk with your dietitian about what choices are best for you. °Summary °Fiber is a type of carbohydrate. It is found in foods such as fruits, vegetables, whole grains, and beans. °A high-fiber diet has many benefits. It can help to prevent constipation, lower blood cholesterol, aid weight loss, and reduce your risk of heart disease, diabetes, and certain cancers. °Increase your intake of fiber gradually. Increasing fiber too quickly may cause cramping, bloating, and gas. Drink plenty of water while you increase the amount of fiber you consume. °The best sources of fiber include whole fruits and vegetables, whole grains, nuts, seeds, and beans. °This information is not intended to replace advice given to you by your health care provider. Make sure you discuss any questions you have with your health care provider. °Document Revised: 06/05/2019 Document Reviewed: 06/05/2019 °Elsevier Patient Education © 2022 [employer] ° °

## 2020-10-21 ENCOUNTER — Other Ambulatory Visit: Payer: Self-pay

## 2020-10-21 MED ORDER — PEG 3350-KCL-NA BICARB-NACL 420 G PO SOLR: ORAL | 0 refills | Status: DC

## 2020-11-01 ENCOUNTER — Ambulatory Visit: Payer: BC Managed Care – PPO | Admitting: Anesthesiology

## 2020-11-01 ENCOUNTER — Ambulatory Visit
Admission: RE | Admit: 2020-11-01 | Discharge: 2020-11-01 | Disposition: A | Discharge status: Home / Self Care | Payer: BC Managed Care – PPO | Attending: Gastroenterology | Admitting: Gastroenterology

## 2020-11-01 ENCOUNTER — Encounter
Admission: RE | Disposition: A | Discharge status: Home / Self Care | Payer: Self-pay | Source: Home / Self Care | Attending: Gastroenterology

## 2020-11-01 DIAGNOSIS — K635 Polyp of colon: Secondary | ICD-10-CM | POA: Diagnosis not present

## 2020-11-01 DIAGNOSIS — Z90722 Acquired absence of ovaries, bilateral: Secondary | ICD-10-CM | POA: Insufficient documentation

## 2020-11-01 DIAGNOSIS — Z98891 History of uterine scar from previous surgery: Secondary | ICD-10-CM | POA: Diagnosis not present

## 2020-11-01 DIAGNOSIS — Z882 Allergy status to sulfonamides status: Secondary | ICD-10-CM | POA: Insufficient documentation

## 2020-11-01 DIAGNOSIS — Z800 Family history of malignant neoplasm of digestive organs: Secondary | ICD-10-CM | POA: Diagnosis not present

## 2020-11-01 DIAGNOSIS — Z79899 Other long term (current) drug therapy: Secondary | ICD-10-CM | POA: Insufficient documentation

## 2020-11-01 DIAGNOSIS — D122 Benign neoplasm of ascending colon: Secondary | ICD-10-CM | POA: Diagnosis not present

## 2020-11-01 DIAGNOSIS — R194 Change in bowel habit: Secondary | ICD-10-CM

## 2020-11-01 DIAGNOSIS — Z8669 Personal history of other diseases of the nervous system and sense organs: Secondary | ICD-10-CM | POA: Diagnosis not present

## 2020-11-01 DIAGNOSIS — K5909 Other constipation: Secondary | ICD-10-CM | POA: Diagnosis not present

## 2020-11-01 HISTORY — PX: COLONOSCOPY WITH PROPOFOL: SHX5780

## 2020-11-01 LAB — POCT PREGNANCY, URINE: Preg Test, Ur: NEGATIVE

## 2020-11-01 SURGERY — COLONOSCOPY WITH PROPOFOL
Anesthesia: General

## 2020-11-01 MED ORDER — SODIUM CHLORIDE 0.9 % IV SOLN
INTRAVENOUS | Status: DC
  Administered 2020-11-01: 20 mL/h via INTRAVENOUS

## 2020-11-01 MED ORDER — PROPOFOL 500 MG/50ML IV EMUL
INTRAVENOUS | Status: DC | PRN
  Administered 2020-11-01: 150 ug/kg/min via INTRAVENOUS

## 2020-11-01 NOTE — Anesthesia Preprocedure Evaluation (Signed)
Anesthesia Evaluation  Patient identified by MRN, date of birth, ID band Patient awake    Reviewed: Allergy & Precautions, H&P , NPO status , Patient's Chart, lab work & pertinent test results, reviewed documented beta blocker date and time   History of Anesthesia Complications (+) PONV and history of anesthetic complications  Airway Mallampati: II   Neck ROM: full    Dental  (+) Teeth Intact   Pulmonary neg pulmonary ROS,    Pulmonary exam normal        Cardiovascular Exercise Tolerance: Good negative cardio ROS Normal cardiovascular exam Rhythm:regular Rate:Normal     Neuro/Psych  Headaches, Seizures -,  Anxiety negative psych ROS   GI/Hepatic negative GI ROS, Neg liver ROS,   Endo/Other  negative endocrine ROS  Renal/GU negative Renal ROS  negative genitourinary   Musculoskeletal   Abdominal   Peds  Hematology  (+) Blood dyscrasia, anemia ,   Anesthesia Other Findings Past Medical History: No date: Anemia No date: Anxiety No date: H/O: cesarean section     Comment:  FTP and NRFHT No date: Headache     Comment:  migraines  No date: Migraine No date: PONV (postoperative nausea and vomiting)     Comment:  nausea and vomiting after 2nd c-section No date: Seizures ([place])     Comment:  as a child, none recently. Past Surgical History: 01/23/2014: CESAREAN SECTION 08/10/2015: CESAREAN SECTION; N/A     Comment:  Procedure: CESAREAN SECTION;  Surgeon: Malachy Mood,              MD;  Location: [facility];  Service: Obstetrics;                Laterality: N/A; 09/26/2016: CESAREAN SECTION WITH BILATERAL TUBAL LIGATION; Bilateral     Comment:  Procedure: CESAREAN SECTION WITH BILATERAL TUBAL               LIGATION;  Surgeon: Malachy Mood, MD;  Location:               [facility];  Service: Obstetrics;  Laterality: Bilateral; 05/20/2019: CYSTOSCOPY; N/A     Comment:  Procedure: CYSTOSCOPY;  Surgeon: Gae Dry, MD;                Location: [facility];  Service: Gynecology;  Laterality:               N/A; 05/20/2019: LAPAROSCOPIC BILATERAL SALPINGECTOMY     Comment:  Procedure: LAPAROSCOPIC BILATERAL SALPINGECTOMY;                Surgeon: Gae Dry, MD;  Location: [facility];                Service: Gynecology;; 05/20/2019: LAPAROSCOPIC LYSIS OF ADHESIONS     Comment:  Procedure: LAPAROSCOPIC LYSIS OF ADHESIONS;  Surgeon:               Gae Dry, MD;  Location: [facility];  Service:               Gynecology;; 05/20/2019: LAPAROSCOPY; N/A     Comment:  Procedure: LAPAROSCOPY DIAGNOSTIC;  Surgeon: Gae Dry, MD;  Location: [facility];  Service: Gynecology;               Laterality: N/A; No date: THERAPEUTIC ABORTION No date: TONSILLECTOMY 09/26/2016: TUBAL LIGATION     Comment:  [facility] No date:  TYMPANOSTOMY TUBE PLACEMENT; Bilateral No date: WISDOM TOOTH EXTRACTION   Reproductive/Obstetrics negative OB ROS                             Anesthesia Physical Anesthesia Plan  ASA: 2  Anesthesia Plan: General   Post-op Pain Management:    Induction:   PONV Risk Score and Plan:   Airway Management Planned:   Additional Equipment:   Intra-op Plan:   Post-operative Plan:   Informed Consent: I have reviewed the patients History and Physical, chart, labs and discussed the procedure including the risks, benefits and alternatives for the proposed anesthesia with the patient or authorized representative who has indicated his/her understanding and acceptance.     Dental Advisory Given  Plan Discussed with: CRNA  Anesthesia Plan Comments:         Anesthesia Quick Evaluation

## 2020-11-01 NOTE — Transfer of Care (Signed)
Immediate Anesthesia Transfer of Care Note  Patient: Brenda Wang  Procedure(s) Performed: COLONOSCOPY WITH PROPOFOL  Patient Location: PACU  Anesthesia Type:General  Level of Consciousness: awake and sedated  Airway & Oxygen Therapy: Patient Spontanous Breathing and Patient connected to nasal cannula oxygen  Post-op Assessment: Report given to RN and Post -op Vital signs reviewed and stable  Post vital signs: Reviewed and stable  Last Vitals:  Vitals Value Taken Time  BP    Temp    Pulse    Resp    SpO2      Last Pain:  Vitals:   11/01/20 1115  TempSrc: Temporal  PainSc: 0-No pain         Complications: No notable events documented.

## 2020-11-01 NOTE — Anesthesia Procedure Notes (Signed)
Date/Time: 11/01/2020 11:48 AM Performed by: Vaughan Sine Pre-anesthesia Checklist: Patient identified, Emergency Drugs available, Suction available, Patient being monitored and Timeout performed Patient Re-evaluated:Patient Re-evaluated prior to induction Oxygen Delivery Method: Nasal cannula Preoxygenation: Pre-oxygenation with 100% oxygen Induction Type: IV induction Placement Confirmation: positive ETCO2 and CO2 detector

## 2020-11-01 NOTE — Op Note (Signed)
[facility] Gastroenterology Patient Name: Brenda Wang Procedure Date: 11/01/2020 10:44 AM MRN: [id number] Account #: [id number] Date of Birth: 1991-01-17 Admit Type: Outpatient Age: 30 Room: [facility] ENDO ROOM 4 Gender: Female Note Status: Finalized Instrument Name: Jasper Riling [id number] Procedure:             Colonoscopy Indications:           Change in bowel habits, Constipation Providers:             [name] MD, MD Referring MD:          No Local Md, MD (Referring MD) Medicines:             Monitored Anesthesia Care Complications:         No immediate complications. Procedure:             Pre-Anesthesia Assessment:                        - Prior to the procedure, a History and Physical was                         performed, and patient medications, allergies and                         sensitivities were reviewed. The patient's tolerance                         of previous anesthesia was reviewed.                        - The risks and benefits of the procedure and the                         sedation options and risks were discussed with the                         patient. All questions were answered and informed                         consent was obtained.                        - ASA Grade Assessment: II - A patient with mild                         systemic disease.                        After obtaining informed consent, the colonoscope was                         passed under direct vision. Throughout the procedure,                         the patient's blood pressure, pulse, and oxygen                         saturations were monitored continuously. The                         Colonoscope was  introduced through the anus and                         advanced to the the cecum, identified by the                         appendiceal orifice. The colonoscopy was performed                         with ease. The patient tolerated the procedure well.                          The quality of the bowel preparation was fair. Findings:      The perianal and digital rectal examinations were normal.      A 4 mm polyp was found in the ascending colon. The polyp was sessile.       The polyp was removed with a jumbo cold forceps. Resection and retrieval       were complete.      The exam was otherwise without abnormality on direct and retroflexion       views. Impression:            - Preparation of the colon was fair.                        - One 4 mm polyp in the ascending colon, removed with                         a jumbo cold forceps. Resected and retrieved.                        - The examination was otherwise normal on direct and                         retroflexion views. Recommendation:        - Discharge patient to home (with escort).                        - Resume previous diet.                        - Continue present medications.                        - Await pathology results.                        - Repeat colonoscopy for surveillance based on                         pathology results. Procedure Code(s):     --- Professional ---                        [phone], Colonoscopy, flexible; with biopsy, single or                         multiple Diagnosis Code(s):     --- Professional ---  K63.5, Polyp of colon                        R19.4, Change in bowel habit                        K59.00, Constipation, unspecified CPT copyright 2019 [employer]. All rights reserved. The codes documented in this report are preliminary and upon coder review may  be revised to meet current compliance requirements. [name], MD [name] MD, MD 11/01/2020 11:57:53 AM This report has been signed electronically. Number of Addenda: 0 Note Initiated On: 11/01/2020 10:44 AM Scope Withdrawal Time: 0 hours 9 minutes 9 seconds  Total Procedure Duration: 0 hours 11 minutes 9 seconds  Estimated Blood Loss:  Estimated  blood loss: none.      [facility]

## 2020-11-01 NOTE — H&P (Signed)
[name], MD [address] [place], [place], [place], [address] [address], [place], [place], [place], [address] Phone: [phone]  Fax: [phone]  Primary Care Physician:  Pcp, No   Pre-Procedure History & Physical: HPI:  Brenda Wang is a 30 y.o. female is here for an colonoscopy.   Past Medical History:  Diagnosis Date   Anemia    Anxiety    H/O: cesarean section    FTP and NRFHT   Headache    migraines    Migraine    PONV (postoperative nausea and vomiting)    nausea and vomiting after 2nd c-section   Seizures ([place])    as a child, none recently.    Past Surgical History:  Procedure Laterality Date   CESAREAN SECTION  01/23/2014   CESAREAN SECTION N/A 08/10/2015   Procedure: CESAREAN SECTION;  Surgeon: Malachy Mood, MD;  Location: [facility];  Service: Obstetrics;  Laterality: N/A;   CESAREAN SECTION WITH BILATERAL TUBAL LIGATION Bilateral 09/26/2016   Procedure: CESAREAN SECTION WITH BILATERAL TUBAL LIGATION;  Surgeon: Malachy Mood, MD;  Location: [facility];  Service: Obstetrics;  Laterality: Bilateral;   CYSTOSCOPY N/A 05/20/2019   Procedure: CYSTOSCOPY;  Surgeon: Gae Dry, MD;  Location: [facility];  Service: Gynecology;  Laterality: N/A;   LAPAROSCOPIC BILATERAL SALPINGECTOMY  05/20/2019   Procedure: LAPAROSCOPIC BILATERAL SALPINGECTOMY;  Surgeon: Gae Dry, MD;  Location: [facility];  Service: Gynecology;;   LAPAROSCOPIC LYSIS OF ADHESIONS  05/20/2019   Procedure: LAPAROSCOPIC LYSIS OF ADHESIONS;  Surgeon: Gae Dry, MD;  Location: [facility];  Service: Gynecology;;   LAPAROSCOPY N/A 05/20/2019   Procedure: LAPAROSCOPY DIAGNOSTIC;  Surgeon: Gae Dry, MD;  Location: [facility];  Service: Gynecology;  Laterality: N/A;   THERAPEUTIC ABORTION     TONSILLECTOMY     TUBAL LIGATION  09/26/2016   [facility]   TYMPANOSTOMY TUBE PLACEMENT Bilateral    WISDOM TOOTH EXTRACTION      Prior to Admission medications   Medication  Sig Start Date End Date Taking? Authorizing Provider  ferrous sulfate 325 (65 FE) MG tablet Take 325 mg by mouth daily with breakfast.    [provider]  ondansetron (ZOFRAN ODT) 4 MG disintegrating tablet Take 1 tablet (4 mg total) by mouth every 8 (eight) hours as needed. 01/27/20   Marcial Pacas, MD  polyethylene glycol-electrolytes (NULYTELY) 420 g solution Prepare according to package instructions. Starting at 5:00 PM: Drink one 8 oz glass of mixture every 15 minutes until you finish half of the jug. Five hours prior to procedure, drink 8 oz glass of mixture every 15 minutes until it is all gone. Make sure you do not drink anything 4 hours prior to your procedure. 10/21/20   [name], MD  Sodium Sulfate-Mag Sulfate-KCl (SUTAB) [phone] MG TABS At 5 PM take 12 tablets using the 8 oz cup provided in the kit drinking 5 cups of water and 5 hours before your procedure repeat the same process. 10/20/20   [name], MD  SUMAtriptan (IMITREX) 50 MG tablet Take 1 tablet (50 mg total) by mouth every 2 (two) hours as needed. May repeat in 2 hours if headache persists or recurs. 01/27/20   Marcial Pacas, MD  venlafaxine XR (EFFEXOR-XR) 37.5 MG 24 hr capsule Take 37.5 mg by mouth daily. 09/01/20   [provider]    Allergies as of 10/21/2020 - Review Complete 10/20/2020  Allergen Reaction Noted   Sulfa antibiotics Hives 01/28/2013  Family History  Problem Relation Age of Onset   Anemia Mother    Anxiety disorder Mother    Kidney disease Father    Colon cancer Paternal Grandfather    Hypertension Paternal Grandmother    Diabetes Paternal Grandmother     Social History   Socioeconomic History   Marital status: Married    Spouse name: Not on file   Number of children: 3   Years of education: college   Highest education level: Bachelor's degree (e.g., BA, AB, BS)  Occupational History   Occupation: Teller at [employer]  Tobacco Use   Smoking status: Never   Smokeless  tobacco: Never  Vaping Use   Vaping Use: Never used  Substance and Sexual Activity   Alcohol use: Yes    Comment: glass of wine daily   Drug use: Yes    Types: Marijuana    Comment: socially   Sexual activity: Yes    Birth control/protection: Surgical    Comment: pregnant  Other Topics Concern   Not on file  Social History Narrative   Lives at home with her family.   Right-handed.   No daily use of caffeine.   Social Determinants of Health   Financial Resource Strain: Not on file  Food Insecurity: Not on file  Transportation Needs: Not on file  Physical Activity: Not on file  Stress: Not on file  Social Connections: Not on file  Intimate Partner Violence: Not on file    Review of Systems: See HPI, otherwise negative ROS  Physical Exam: There were no vitals taken for this visit. General:   Alert,  pleasant and cooperative in NAD Head:  Normocephalic and atraumatic. Neck:  Supple; no masses or thyromegaly. Lungs:  Clear throughout to auscultation, normal respiratory effort.    Heart:  +S1, +S2, Regular rate and rhythm, No edema. Abdomen:  Soft, nontender and nondistended. Normal bowel sounds, without guarding, and without rebound.   Neurologic:  Alert and  oriented x4;  grossly normal neurologically.  Impression/Plan: Brenda Wang is here for an colonoscopy to be performed for constipation Risks, benefits, limitations, and alternatives regarding  colonoscopy have been reviewed with the patient.  Questions have been answered.  All parties agreeable.   [name], MD  11/01/2020, 11:06 AM

## 2020-11-02 ENCOUNTER — Encounter: Payer: Self-pay | Admitting: Gastroenterology

## 2020-11-02 LAB — SURGICAL PATHOLOGY

## 2020-11-02 NOTE — Progress Notes (Signed)
Polyp was precancerous.  Unusual to have it at the age of 44.  Since the prep was fair would recommend repeat colonoscopy in 6 months with a 2-day prep to ensure she has no further polyps

## 2020-11-08 ENCOUNTER — Telehealth: Payer: Self-pay

## 2020-11-08 NOTE — Telephone Encounter (Signed)
Called patient to let her know the below information. She agreed on doing the colonoscopy procedure procedure again in 6 months. I told her that we would call her to remind her and that she would have to prep for two days. Patient agreed.

## 2020-11-08 NOTE — Telephone Encounter (Signed)
-----   Message from [name], MD sent at 11/02/2020  2:08 PM EDT ----- Polyp was precancerous.  Unusual to have it at the age of 64.  Since the prep was fair would recommend repeat colonoscopy in 6 months with a 2-day prep to ensure she has no further polyps

## 2020-11-09 ENCOUNTER — Telehealth: Payer: Self-pay | Admitting: Gastroenterology

## 2020-11-09 MED ORDER — TRULANCE 3 MG PO TABS
1.0000 | ORAL_TABLET | Freq: Every day | ORAL | 3 refills | Status: DC
Start: 2020-11-09 — End: 2023-09-11

## 2020-11-09 NOTE — Telephone Encounter (Signed)
Patient states the Trulance is helping can we call her in a prescription for the medication

## 2020-11-09 NOTE — Telephone Encounter (Signed)
Sent medication pharmacy

## 2020-11-09 NOTE — Anesthesia Postprocedure Evaluation (Signed)
Anesthesia Post Note  Patient: Brenda Wang  Procedure(s) Performed: COLONOSCOPY WITH PROPOFOL  Patient location during evaluation: PACU Anesthesia Type: General Level of consciousness: awake and alert Pain management: pain level controlled Vital Signs Assessment: post-procedure vital signs reviewed and stable Respiratory status: spontaneous breathing, nonlabored ventilation, respiratory function stable and patient connected to nasal cannula oxygen Cardiovascular status: blood pressure returned to baseline and stable Postop Assessment: no apparent nausea or vomiting Anesthetic complications: no   No notable events documented.   Last Vitals:  Vitals:   11/01/20 1115 11/01/20 1200  BP: (!) 143/88   Pulse: 81   Resp: 20   Temp: (!) 36.3 C 36.8 C  SpO2: 99%     Last Pain:  Vitals:   11/01/20 1220  TempSrc:   PainSc: 0-No pain                 Molli Barrows

## 2020-11-09 NOTE — Telephone Encounter (Signed)
Pt. Requesting a refill on some samples that's he received on her last visit. She does not remember the name of them. She is requesting a call back

## 2020-11-14 ENCOUNTER — Encounter: Payer: Self-pay | Admitting: Gastroenterology

## 2020-12-21 ENCOUNTER — Ambulatory Visit (INDEPENDENT_AMBULATORY_CARE_PROVIDER_SITE_OTHER): Payer: BC Managed Care – PPO | Admitting: Gastroenterology

## 2020-12-21 ENCOUNTER — Encounter: Payer: Self-pay | Admitting: Gastroenterology

## 2020-12-21 VITALS — BP 120/82 | HR 77 | Temp 98.4°F | Wt 223.0 lb

## 2020-12-21 DIAGNOSIS — K5909 Other constipation: Secondary | ICD-10-CM | POA: Diagnosis not present

## 2020-12-21 DIAGNOSIS — Z86010 Personal history of colonic polyps: Secondary | ICD-10-CM | POA: Diagnosis not present

## 2020-12-21 NOTE — Progress Notes (Signed)
[name] MD, MRCP(U.K) [address]  [place]  [place], [place] [address]  Main: [phone]  Fax: [phone]   Primary Care Physician: Pcp, No  Primary Gastroenterologist:  Dr. [name]   Chief complaint: Follow-up for constipation   HPI: Brenda Wang is a 30 y.o. female  Summary of history :  She is here today to follow-up for constipation.  Initially referred and seen on 10/20/2020.  Having a bowel movement only 1-2 times a week.  Very hard in nature.  Had 3 C-sections and had subsequent release of adhesions attributed to scar tissue.  Tried over-the-counter medication which has not worked in the past.  Commenced on [employer] which had helped and prescription provided for 90 days.  Interval history   10/20/2020-12/21/2020  11/01/2020: Colonoscopy: Prep was fair.  4 mm polyp found in the ascending colon sessile in nature resected with a jumbo forceps.  Polyp was a tubular adenoma. She is doing well since the last visit.  Having good regular bowel movements with Trulance.  No other complaints.  Current Outpatient Medications  Medication Sig Dispense Refill   ferrous sulfate 325 (65 FE) MG tablet Take 325 mg by mouth daily with breakfast.     ondansetron (ZOFRAN ODT) 4 MG disintegrating tablet Take 1 tablet (4 mg total) by mouth every 8 (eight) hours as needed. 20 tablet 6   Plecanatide (TRULANCE) 3 MG TABS Take 1 tablet by mouth daily. 90 tablet 3   SUMAtriptan (IMITREX) 50 MG tablet Take 1 tablet (50 mg total) by mouth every 2 (two) hours as needed. May repeat in 2 hours if headache persists or recurs. 12 tablet 6   venlafaxine XR (EFFEXOR-XR) 37.5 MG 24 hr capsule Take 37.5 mg by mouth daily.     No current facility-administered medications for this visit.    Allergies as of 12/21/2020 - Review Complete 12/21/2020  Allergen Reaction Noted   Sulfa antibiotics Hives 01/28/2013    ROS:  General: Negative for anorexia, weight loss, fever, chills, fatigue,  weakness. ENT: Negative for hoarseness, difficulty swallowing , nasal congestion. CV: Negative for chest pain, angina, palpitations, dyspnea on exertion, peripheral edema.  Respiratory: Negative for dyspnea at rest, dyspnea on exertion, cough, sputum, wheezing.  GI: See history of present illness. GU:  Negative for dysuria, hematuria, urinary incontinence, urinary frequency, nocturnal urination.  Endo: Negative for unusual weight change.    Physical Examination:   BP 120/82   Pulse 77   Temp 98.4 F (36.9 C) (Oral)   Wt 223 lb (101.2 kg)   BMI 38.28 kg/m   General: Well-nourished, well-developed in no acute distress.  Eyes: No icterus. Conjunctivae pink. Neuro: Alert and oriented x 3.  Grossly intact. Skin: Warm and dry, no jaundice.   Psych: Alert and cooperative, normal mood and affect.   Imaging Studies: No results found.  Assessment and Plan:   Brenda Wang is a 30 y.o. y/o female here to follow-up for chronic constipation commenced on Trulance at the last visit which seems to have worked.  Colonoscopy performed.  Tubular adenoma resected in the ascending colon.  Explained that very unusual to have an adenoma at the young age of 45.  Since the bowel prep was poor we will plan to repeat colonoscopy 6 months after the last 1 which would be in March 2023.  I have discussed alternative options, risks & benefits,  which include, but are not limited to, bleeding, infection, perforation,respiratory complication & drug reaction.  The patient agrees with this plan & written consent will be obtained.     Dr [name]  MD,MRCP [facility]) Follow up in as needed

## 2021-03-07 ENCOUNTER — Emergency Department
Admission: EM | Admit: 2021-03-07 | Discharge: 2021-03-07 | Disposition: A | Discharge status: Home / Self Care | Payer: BC Managed Care – PPO | Attending: Emergency Medicine | Admitting: Emergency Medicine

## 2021-03-07 ENCOUNTER — Other Ambulatory Visit: Payer: Self-pay

## 2021-03-07 ENCOUNTER — Emergency Department: Payer: BC Managed Care – PPO

## 2021-03-07 ENCOUNTER — Encounter: Payer: Self-pay | Admitting: Emergency Medicine

## 2021-03-07 DIAGNOSIS — G43009 Migraine without aura, not intractable, without status migrainosus: Secondary | ICD-10-CM | POA: Insufficient documentation

## 2021-03-07 DIAGNOSIS — R55 Syncope and collapse: Secondary | ICD-10-CM | POA: Insufficient documentation

## 2021-03-07 DIAGNOSIS — Z79899 Other long term (current) drug therapy: Secondary | ICD-10-CM | POA: Diagnosis not present

## 2021-03-07 DIAGNOSIS — Z20822 Contact with and (suspected) exposure to covid-19: Secondary | ICD-10-CM | POA: Diagnosis not present

## 2021-03-07 DIAGNOSIS — R519 Headache, unspecified: Secondary | ICD-10-CM | POA: Diagnosis present

## 2021-03-07 LAB — URINE DRUG SCREEN, QUALITATIVE (ARMC ONLY)
Amphetamines, Ur Screen: NOT DETECTED
Barbiturates, Ur Screen: NOT DETECTED
Benzodiazepine, Ur Scrn: NOT DETECTED
Cannabinoid 50 Ng, Ur ~~LOC~~: NOT DETECTED
Cocaine Metabolite,Ur ~~LOC~~: NOT DETECTED
MDMA (Ecstasy)Ur Screen: NOT DETECTED
Methadone Scn, Ur: NOT DETECTED
Opiate, Ur Screen: NOT DETECTED
Phencyclidine (PCP) Ur S: NOT DETECTED
Tricyclic, Ur Screen: NOT DETECTED

## 2021-03-07 LAB — CBC WITH DIFFERENTIAL/PLATELET
Abs Immature Granulocytes: 0.02 10*3/uL (ref 0.00–0.07)
Basophils Absolute: 0 10*3/uL (ref 0.0–0.1)
Basophils Relative: 1 %
Eosinophils Absolute: 0.1 10*3/uL (ref 0.0–0.5)
Eosinophils Relative: 1 %
HCT: 39.7 % (ref 36.0–46.0)
Hemoglobin: 13.2 g/dL (ref 12.0–15.0)
Immature Granulocytes: 0 %
Lymphocytes Relative: 25 %
Lymphs Abs: 1.7 10*3/uL (ref 0.7–4.0)
MCH: 28.9 pg (ref 26.0–34.0)
MCHC: 33.2 g/dL (ref 30.0–36.0)
MCV: 86.9 fL (ref 80.0–100.0)
Monocytes Absolute: 0.4 10*3/uL (ref 0.1–1.0)
Monocytes Relative: 6 %
Neutro Abs: 4.5 10*3/uL (ref 1.7–7.7)
Neutrophils Relative %: 67 %
Platelets: 259 10*3/uL (ref 150–400)
RBC: 4.57 MIL/uL (ref 3.87–5.11)
RDW: 14.1 % (ref 11.5–15.5)
WBC: 6.7 10*3/uL (ref 4.0–10.5)
nRBC: 0 % (ref 0.0–0.2)

## 2021-03-07 LAB — RESP PANEL BY RT-PCR (FLU A&B, COVID) ARPGX2
Influenza A by PCR: NEGATIVE
Influenza B by PCR: NEGATIVE
SARS Coronavirus 2 by RT PCR: NEGATIVE

## 2021-03-07 LAB — COMPREHENSIVE METABOLIC PANEL
ALT: 15 U/L (ref 0–44)
AST: 24 U/L (ref 15–41)
Albumin: 3.7 g/dL (ref 3.5–5.0)
Alkaline Phosphatase: 69 U/L (ref 38–126)
Anion gap: 6 (ref 5–15)
BUN: 14 mg/dL (ref 6–20)
CO2: 25 mmol/L (ref 22–32)
Calcium: 8.7 mg/dL — ABNORMAL LOW (ref 8.9–10.3)
Chloride: 103 mmol/L (ref 98–111)
Creatinine, Ser: 0.82 mg/dL (ref 0.44–1.00)
GFR, Estimated: >60 mL/min (ref >60)
Glucose, Bld: 88 mg/dL (ref 70–99)
Potassium: 4.7 mmol/L (ref 3.5–5.1)
Sodium: 134 mmol/L — ABNORMAL LOW (ref 135–145)
Total Bilirubin: 1.4 mg/dL — ABNORMAL HIGH (ref 0.3–1.2)
Total Protein: 7.8 g/dL (ref 6.5–8.1)

## 2021-03-07 LAB — URINALYSIS, ROUTINE W REFLEX MICROSCOPIC
Bilirubin Urine: NEGATIVE
Glucose, UA: NEGATIVE mg/dL
Hgb urine dipstick: NEGATIVE
Ketones, ur: NEGATIVE mg/dL
Leukocytes,Ua: NEGATIVE
Nitrite: NEGATIVE
Protein, ur: NEGATIVE mg/dL
Specific Gravity, Urine: 1.019 (ref 1.005–1.030)
pH: 7 (ref 5.0–8.0)

## 2021-03-07 LAB — POC URINE PREG, ED: Preg Test, Ur: NEGATIVE

## 2021-03-07 MED ORDER — SODIUM CHLORIDE 0.9 % IV BOLUS
1000.0000 mL | Freq: Once | INTRAVENOUS | Status: AC
  Administered 2021-03-07: 1000 mL via INTRAVENOUS

## 2021-03-07 MED ORDER — KETOROLAC TROMETHAMINE 30 MG/ML IJ SOLN
30.0000 mg | Freq: Once | INTRAMUSCULAR | Status: AC
  Administered 2021-03-07: 30 mg via INTRAMUSCULAR
  Filled 2021-03-07: qty 1

## 2021-03-07 MED ORDER — DIPHENHYDRAMINE HCL 50 MG/ML IJ SOLN
25.0000 mg | Freq: Once | INTRAMUSCULAR | Status: AC
  Administered 2021-03-07: 25 mg via INTRAVENOUS
  Filled 2021-03-07: qty 1

## 2021-03-07 MED ORDER — METOCLOPRAMIDE HCL 5 MG/ML IJ SOLN
10.0000 mg | Freq: Once | INTRAMUSCULAR | Status: AC
  Administered 2021-03-07: 10 mg via INTRAVENOUS
  Filled 2021-03-07: qty 2

## 2021-03-07 NOTE — Discharge Instructions (Signed)
Follow-up with your neurologist at [facility] or you may also get a second opinion with the neurologist [facility] clinic that is listed on your discharge papers.  Also continue seeing your primary care provider and cardiologist that you are established with.  Continue with your routine medications.

## 2021-03-07 NOTE — ED Notes (Signed)
C/O feeling faint.  Patient placed in wheelchair, able to stand independently.  VS obtained.  Patient hyperventilating.  Patient states "I just don't feel good.  Placed in recliner chair in sub wait.  Grandmother with patient.

## 2021-03-07 NOTE — ED Provider Notes (Signed)
[facility] Provider Note    Event Date/Time   First MD Initiated Contact with Patient 03/07/21 [phone]     (approximate)   History   Headache   HPI  Brenda Wang is a 31 y.o. female   is brought to the ED by grandmother after initially being seen at [facility] and referred to the emergency department for her migraine headache.  Patient states that headache started this morning after she woke up.  She has taken her regular medication without any relief of her headache.  Patient also reports chills today but no nausea, vomiting or diarrhea.  Patient reports a frontal headache without visual changes.  She states that this is slightly different from her usual migraine.  Patient does have a history of migraines and has been evaluated and sees a neurologist at [facility].  Mother is on the phone and also supplies additional history of syncopal episodes that are intermittent and have been worked up at [facility] as well.  Patient currently is seeing the neurologist, cardiologist and had a sleep study.  She reports that she has not seen her neurologist since August or September of last year.      Physical Exam   Triage Vital Signs: ED Triage Vitals  Enc Vitals Group     BP 03/07/21 0917 (!) 186/72     Pulse Rate 03/07/21 0917 98     Resp 03/07/21 0917 (!) 38     Temp 03/07/21 0917 98.4 F (36.9 C)     Temp Source 03/07/21 0917 Oral     SpO2 03/07/21 0917 100 %     Weight 03/07/21 0905 223 lb 1.7 oz (101.2 kg)     Height 03/07/21 0905 $RemoveBefor'5\' 4"'eUpDTqDhwnyd$  (1.626 m)     Head Circumference --      Peak Flow --      Pain Score 03/07/21 0905 10     Pain Loc --      Pain Edu? --      Excl. in [place]? --     Most recent vital signs: Vitals:   03/07/21 1220 03/07/21 1521  BP: 104/63 124/72  Pulse: 75 88  Resp: 20 20  Temp:    SpO2: 98% 99%     General: Awake, no distress.  Able to answer questions appropriately.  Family is in the room also and is listening to the answers.   Patient does not appear to be toxic or acutely ill. CV:  Good peripheral perfusion.  Heart regular rate and rhythm without murmur. Resp:  Normal effort.  Lungs are clear bilaterally. Abd:  No distention.  Nontender, bowel sounds normoactive x4 quadrants. Other:  Conjunctive are clear bilaterally.  PERRLA, EOMI's, mild photophobia.  Cranial nerves II through XII grossly intact.  Speech is clear.  Good muscle strength bilaterally.  Right EAC and TM are clear.  Neck is supple without cervical lymphadenopathy.  Cervical range of motion is without restriction.  Good muscle strength at 5/5 bilaterally.   ED Results / Procedures / Treatments   Labs (all labs ordered are listed, but only abnormal results are displayed) Labs Reviewed  URINALYSIS, ROUTINE W REFLEX MICROSCOPIC - Abnormal; Notable for the following components:      Result Value   Color, Urine YELLOW (*)    APPearance CLEAR (*)    All other components within normal limits  COMPREHENSIVE METABOLIC PANEL - Abnormal; Notable for the following components:   Sodium 134 (*)    Calcium  8.7 (*)    Total Bilirubin 1.4 (*)    All other components within normal limits  RESP PANEL BY RT-PCR (FLU A&B, COVID) ARPGX2  CBC WITH DIFFERENTIAL/PLATELET  URINE DRUG SCREEN, QUALITATIVE ([facility] ONLY)  POC URINE PREG, ED     EKG Vent. rate 75 BPM normal sinus rhythm PR interval 134 ms QRS duration 90 ms QT/QTcB 390/435 ms P-R-T axes 48 38 31 Normal sinus rhythm     RADIOLOGY  CT scan images were reviewed by myself and radiology report is negative for any acute changes.  PROCEDURES:  Critical Care performed: No  Procedures   MEDICATIONS ORDERED IN ED: Medications  sodium chloride 0.9 % bolus 1,000 mL (0 mLs Intravenous Stopped 03/07/21 1624)  ketorolac (TORADOL) 30 MG/ML injection 30 mg (30 mg Intramuscular Given 03/07/21 1336)  metoCLOPramide (REGLAN) injection 10 mg (10 mg Intravenous Given 03/07/21 1334)  diphenhydrAMINE (BENADRYL)  injection 25 mg (25 mg Intravenous Given 03/07/21 1331)     IMPRESSION / MDM / ASSESSMENT AND PLAN / ED COURSE  I reviewed the triage vital signs and the nursing notes.  ----------------------------------------- 2:27 PM on 03/07/2021 ----------------------------------------- Patient sleeping.  Differential diagnosis includes, but is not limited to, migraine, chronic episodes of syncopal issues, influenza, COVID, urinary tract infection, syncopal episodes secondary to substance use, hypoglycemia, anemia,  31 year old female presents to the ED with complaint of continued migraine headache and was seen at [facility] earlier today and brought to the ED for further evaluation.  Patient reports a frontal headache which is different than her normal and has taken her normal medications without any relief of her headache.  She also complains of some chills but denies any nausea, vomiting or diarrhea.  Patient also has a long history of intermittent syncopal episodes which have investigated by numerous doctors including neurology at [facility], cardiology, PCP and is also had a sleep study done to see if this could be relating to her difficulties.  Today in the emergency department pregnancy test was negative, respiratory panel was negative for COVID and influenza.  Urinalysis was negative for infection and CBC was unremarkable.  Met C was unremarkable with exception of a sodium level which was minimally decreased at 134.  CT scan was negative per radiology report and reviewing the images myself.  With labs and imaging negative and no acute changes patient was given IV Reglan, Benadryl, ketorolac and had relief of her headache.  Patient was sleeping on multiple visits to the room to see if this was helping.  I spoke with her mother who was present and would like the name of a neurologist here in [place] to get a second opinion for her headaches.  Also no new findings noted and syncopal episodes have been worked  up at [facility] without any findings to explain these episodes.  Patient was improved prior to discharge and vital signs remained stable.  She is strongly encouraged to continue seeing her cardiologist and PCP and follow-up with her neurologist at [facility] if she does not follow-up with someone here in [place].      FINAL CLINICAL IMPRESSION(S) / ED DIAGNOSES   Final diagnoses:  Migraine without aura and without status migrainosus, not intractable  Syncope, unspecified syncope type     Rx / DC Orders   ED Discharge Orders     None        Note:  This document was prepared using Dragon voice recognition software and may include unintentional dictation errors.   Letitia Neri  L, PA-C 03/08/21 0830    Vanessa [place], MD 03/08/21 1010

## 2021-03-07 NOTE — ED Triage Notes (Signed)
C/O migraine headache and chills today.  Referred to ED by [facility].  Patient states she has history of migraine headaches.  Headache started this morning.  Venlafaxine taken without relief.  Patient is AAOx3.  Skin warm and dry. AND

## 2021-03-07 NOTE — ED Notes (Signed)
See triage note  presents with migraine  states she has a hx of same   states she is has had chills and also has had intermittent syncopal episodes

## 2021-03-22 ENCOUNTER — Telehealth: Payer: Self-pay

## 2021-03-22 ENCOUNTER — Other Ambulatory Visit: Payer: Self-pay

## 2021-03-22 DIAGNOSIS — Z1211 Encounter for screening for malignant neoplasm of colon: Secondary | ICD-10-CM

## 2021-03-22 MED ORDER — SUTAB 1479-225-188 MG PO TABS: 12.0000 | ORAL_TABLET | Freq: Once | ORAL | 0 refills | Status: AC

## 2021-03-22 NOTE — Progress Notes (Signed)
Gastroenterology Pre-Procedure Review  Request Date: 04/11/2021 Requesting Physician: Dr. Vicente Males  PATIENT REVIEW QUESTIONS: The patient responded to the following health history questions as indicated:    1. Are you having any GI issues? no 2. Do you have a personal history of Polyps? no 3. Do you have a family history of Colon Cancer or Polyps? yes (GRANDFATHER HAD COLON CANCER) 4. Diabetes Mellitus? no 5. Joint replacements in the past 12 months?no 6. Major health problems in the past 3 months?no 7. Any artificial heart valves, MVP, or defibrillator?no    MEDICATIONS & ALLERGIES:    Patient reports the following regarding taking any anticoagulation/antiplatelet therapy:   Plavix, Coumadin, Eliquis, Xarelto, Lovenox, Pradaxa, Brilinta, or Effient? no Aspirin? no  Patient confirms/reports the following medications:  Current Outpatient Medications  Medication Sig Dispense Refill   ferrous sulfate 325 (65 FE) MG tablet Take 325 mg by mouth daily with breakfast.     ondansetron (ZOFRAN ODT) 4 MG disintegrating tablet Take 1 tablet (4 mg total) by mouth every 8 (eight) hours as needed. 20 tablet 6   Plecanatide (TRULANCE) 3 MG TABS Take 1 tablet by mouth daily. 90 tablet 3   SUMAtriptan (IMITREX) 50 MG tablet Take 1 tablet (50 mg total) by mouth every 2 (two) hours as needed. May repeat in 2 hours if headache persists or recurs. 12 tablet 6   venlafaxine XR (EFFEXOR-XR) 37.5 MG 24 hr capsule Take 37.5 mg by mouth daily.     No current facility-administered medications for this visit.    Patient confirms/reports the following allergies:  Allergies  Allergen Reactions   Sulfa Antibiotics Hives    No orders of the defined types were placed in this encounter.   AUTHORIZATION INFORMATION Primary Insurance: 1D#: Group #:  Secondary Insurance: 1D#: Group #:  SCHEDULE INFORMATION: Date: 04/11/2021 Time: Location:[facility]

## 2021-03-22 NOTE — Telephone Encounter (Signed)
CALLED PATIENT SHE IS SCHEDULED FOR 05/09/2021

## 2021-04-08 ENCOUNTER — Encounter: Payer: Self-pay | Admitting: Gastroenterology

## 2021-04-11 ENCOUNTER — Encounter: Payer: Self-pay | Admitting: Gastroenterology

## 2021-04-11 ENCOUNTER — Ambulatory Visit: Payer: BC Managed Care – PPO | Admitting: Certified Registered Nurse Anesthetist

## 2021-04-11 ENCOUNTER — Ambulatory Visit
Admission: RE | Admit: 2021-04-11 | Discharge: 2021-04-11 | Disposition: A | Discharge status: Home / Self Care | Payer: BC Managed Care – PPO | Attending: Gastroenterology | Admitting: Gastroenterology

## 2021-04-11 ENCOUNTER — Encounter
Admission: RE | Disposition: A | Discharge status: Home / Self Care | Payer: Self-pay | Source: Home / Self Care | Attending: Gastroenterology

## 2021-04-11 DIAGNOSIS — F419 Anxiety disorder, unspecified: Secondary | ICD-10-CM | POA: Diagnosis not present

## 2021-04-11 DIAGNOSIS — Z1211 Encounter for screening for malignant neoplasm of colon: Secondary | ICD-10-CM | POA: Diagnosis not present

## 2021-04-11 DIAGNOSIS — Z8669 Personal history of other diseases of the nervous system and sense organs: Secondary | ICD-10-CM | POA: Diagnosis not present

## 2021-04-11 DIAGNOSIS — Z86010 Personal history of colonic polyps: Secondary | ICD-10-CM | POA: Insufficient documentation

## 2021-04-11 DIAGNOSIS — Z90722 Acquired absence of ovaries, bilateral: Secondary | ICD-10-CM | POA: Diagnosis not present

## 2021-04-11 HISTORY — PX: COLONOSCOPY WITH PROPOFOL: SHX5780

## 2021-04-11 LAB — POCT PREGNANCY, URINE: Preg Test, Ur: NEGATIVE

## 2021-04-11 SURGERY — COLONOSCOPY WITH PROPOFOL
Anesthesia: General

## 2021-04-11 MED ORDER — PROPOFOL 500 MG/50ML IV EMUL
INTRAVENOUS | Status: DC | PRN
  Administered 2021-04-11: 160 ug/kg/min via INTRAVENOUS

## 2021-04-11 MED ORDER — SODIUM CHLORIDE 0.9 % IV SOLN: INTRAVENOUS | Status: DC

## 2021-04-11 MED ORDER — PROPOFOL 10 MG/ML IV BOLUS
INTRAVENOUS | Status: DC | PRN
  Administered 2021-04-11: 80 mg via INTRAVENOUS
  Administered 2021-04-11: 30 mg via INTRAVENOUS
  Administered 2021-04-11: 20 mg via INTRAVENOUS

## 2021-04-11 NOTE — Op Note (Signed)
[facility] Patient Name: Demarie Uhlig Procedure Date: 04/11/2021 9:37 AM MRN: [id number] Account #: [id number] Date of Birth: 10/27/1990 Admit Type: Outpatient Age: 31 Room: [facility] ENDO ROOM 4 Gender: Female Note Status: Finalized Instrument Name: Jasper Riling [id number] Procedure:             Colonoscopy Indications:           Surveillance: Personal history of colonic polyps with                         unknown histology on last colonoscopy (inadequate                         bowel preparation) less than 3 years ago Providers:             [name] MD, MD Referring MD:          No Local Md, MD (Referring MD) Medicines:             Monitored Anesthesia Care Complications:         No immediate complications. Procedure:             Pre-Anesthesia Assessment:                        - Prior to the procedure, a History and Physical was                         performed, and patient medications, allergies and                         sensitivities were reviewed. The patient's tolerance                         of previous anesthesia was reviewed.                        - The risks and benefits of the procedure and the                         sedation options and risks were discussed with the                         patient. All questions were answered and informed                         consent was obtained.                        - ASA Grade Assessment: II - A patient with mild                         systemic disease.                        After obtaining informed consent, the colonoscope was                         passed under direct vision. Throughout the procedure,  the patient's blood pressure, pulse, and oxygen                         saturations were monitored continuously. The                         Colonoscope was introduced through the anus and                         advanced to the the cecum, identified by the                          appendiceal orifice. The colonoscopy was performed                         with ease. The patient tolerated the procedure well.                         The quality of the bowel preparation was adequate. Findings:      The perianal and digital rectal examinations were normal.      The entire examined colon appeared normal. Impression:            - The entire examined colon is normal.                        - No specimens collected. Recommendation:        - Discharge patient to home (with escort).                        - Resume previous diet.                        - Continue present medications.                        - Repeat colonoscopy in 5 years for surveillance. Procedure Code(s):     --- Professional ---                        [id number], Colonoscopy, flexible; diagnostic, including                         collection of specimen(s) by brushing or washing, when                         performed (separate procedure) Diagnosis Code(s):     --- Professional ---                        Z86.010, Personal history of colonic polyps CPT copyright 2019 American Medical Association. All rights reserved. The codes documented in this report are preliminary and upon coder review may  be revised to meet current compliance requirements. [name], MD [name] MD, MD 04/11/2021 9:59:40 AM This report has been signed electronically. Number of Addenda: 0 Note Initiated On: 04/11/2021 9:37 AM Scope Withdrawal Time: 0 hours 6 minutes 4 seconds  Total Procedure Duration: 0 hours 10 minutes 19 seconds  Estimated Blood Loss:  Estimated blood loss: none.      [facility]

## 2021-04-11 NOTE — H&P (Signed)
Brenda Bellows, MD [address] [place], [place], [place], [address] [address], [place], [place], [place], [address] Phone: [phone]  Fax: [phone]  Primary Care Physician:  Pcp, No   Pre-Procedure History & Physical: HPI:  Brenda Wang is a 31 y.o. female is here for an colonoscopy.   Past Medical History:  Diagnosis Date   Anemia    Anxiety    H/O: cesarean section    FTP and NRFHT   Headache    migraines    Migraine    PONV (postoperative nausea and vomiting)    nausea and vomiting after 2nd c-section   Seizures ([place])    as a child, none recently.    Past Surgical History:  Procedure Laterality Date   CESAREAN SECTION  01/23/2014   CESAREAN SECTION N/A 08/10/2015   Procedure: CESAREAN SECTION;  Surgeon: Malachy Mood, MD;  Location: [facility] ORS;  Service: Obstetrics;  Laterality: N/A;   CESAREAN SECTION WITH BILATERAL TUBAL LIGATION Bilateral 09/26/2016   Procedure: CESAREAN SECTION WITH BILATERAL TUBAL LIGATION;  Surgeon: Malachy Mood, MD;  Location: [facility] ORS;  Service: Obstetrics;  Laterality: Bilateral;   COLONOSCOPY WITH PROPOFOL N/A 11/01/2020   Procedure: COLONOSCOPY WITH PROPOFOL;  Surgeon: Brenda Bellows, MD;  Location: [facility] ENDOSCOPY;  Service: Gastroenterology;  Laterality: N/A;   CYSTOSCOPY N/A 05/20/2019   Procedure: CYSTOSCOPY;  Surgeon: Gae Dry, MD;  Location: [facility] ORS;  Service: Gynecology;  Laterality: N/A;   LAPAROSCOPIC BILATERAL SALPINGECTOMY  05/20/2019   Procedure: LAPAROSCOPIC BILATERAL SALPINGECTOMY;  Surgeon: Gae Dry, MD;  Location: [facility] ORS;  Service: Gynecology;;   LAPAROSCOPIC LYSIS OF ADHESIONS  05/20/2019   Procedure: LAPAROSCOPIC LYSIS OF ADHESIONS;  Surgeon: Gae Dry, MD;  Location: [facility] ORS;  Service: Gynecology;;   LAPAROSCOPY N/A 05/20/2019   Procedure: LAPAROSCOPY DIAGNOSTIC;  Surgeon: Gae Dry, MD;  Location: [facility] ORS;  Service: Gynecology;  Laterality: N/A;   THERAPEUTIC ABORTION      TONSILLECTOMY     TUBAL LIGATION  09/26/2016   [facility]   TYMPANOSTOMY TUBE PLACEMENT Bilateral    WISDOM TOOTH EXTRACTION      Prior to Admission medications   Medication Sig Start Date End Date Taking? Authorizing Provider  ferrous sulfate 325 (65 FE) MG tablet Take 325 mg by mouth daily with breakfast.    [provider]  ondansetron (ZOFRAN ODT) 4 MG disintegrating tablet Take 1 tablet (4 mg total) by mouth every 8 (eight) hours as needed. 01/27/20   Marcial Pacas, MD  Plecanatide (TRULANCE) 3 MG TABS Take 1 tablet by mouth daily. 11/09/20   Brenda Bellows, MD  SUMAtriptan (IMITREX) 50 MG tablet Take 1 tablet (50 mg total) by mouth every 2 (two) hours as needed. May repeat in 2 hours if headache persists or recurs. 01/27/20   Marcial Pacas, MD  venlafaxine XR (EFFEXOR-XR) 37.5 MG 24 hr capsule Take 37.5 mg by mouth daily. 09/01/20   [provider]    Allergies as of 03/22/2021 - Review Complete 03/22/2021  Allergen Reaction Noted   Sulfa antibiotics Hives 01/28/2013    Family History  Problem Relation Age of Onset   Anemia Mother    Anxiety disorder Mother    Kidney disease Father    Colon cancer Paternal Grandfather    Hypertension Paternal Grandmother    Diabetes Paternal Grandmother     Social History   Socioeconomic History   Marital status: Married    Spouse name: Not on file   Number  of children: 3   Years of education: college   Highest education level: Bachelor's degree (e.g., BA, AB, BS)  Occupational History   Occupation: Teller at [employer]  Tobacco Use   Smoking status: Never   Smokeless tobacco: Never  Vaping Use   Vaping Use: Never used  Substance and Sexual Activity   Alcohol use: Yes    Comment: glass of wine daily   Drug use: Not Currently    Types: Marijuana    Comment: socially   Sexual activity: Yes    Birth control/protection: Surgical    Comment: pregnant  Other Topics Concern   Not on file  Social History Narrative   Lives  at home with her family.   Right-handed.   No daily use of caffeine.   Social Determinants of Health   Financial Resource Strain: Not on file  Food Insecurity: Not on file  Transportation Needs: Not on file  Physical Activity: Not on file  Stress: Not on file  Social Connections: Not on file  Intimate Partner Violence: Not on file    Review of Systems: See HPI, otherwise negative ROS  Physical Exam: BP 96/72    Pulse 66    Temp 97.6 F (36.4 C) (Temporal)    Resp 16    Ht 5\' 4"  (1.626 m)    Wt 90.7 kg    SpO2 100%    BMI 34.33 kg/m  General:   Alert,  pleasant and cooperative in NAD Head:  Normocephalic and atraumatic. Neck:  Supple; no masses or thyromegaly. Lungs:  Clear throughout to auscultation, normal respiratory effort.    Heart:  +S1, +S2, Regular rate and rhythm, No edema. Abdomen:  Soft, nontender and nondistended. Normal bowel sounds, without guarding, and without rebound.   Neurologic:  Alert and  oriented x4;  grossly normal neurologically.  Impression/Plan: Brenda Wang is here for an colonoscopy to be performed for surveillance due to prior history of colon polyps   Risks, benefits, limitations, and alternatives regarding  colonoscopy have been reviewed with the patient.  Questions have been answered.  All parties agreeable.   Brenda Bellows, MD  04/11/2021, 9:34 AM

## 2021-04-11 NOTE — Anesthesia Procedure Notes (Signed)
Date/Time: 04/11/2021 9:44 AM Performed by: Demetrius Charity, CRNA Pre-anesthesia Checklist: Patient identified, Patient being monitored, Timeout performed, Emergency Drugs available and Suction available Patient Re-evaluated:Patient Re-evaluated prior to induction Oxygen Delivery Method: Nasal cannula Dental Injury: Teeth and Oropharynx as per pre-operative assessment

## 2021-04-11 NOTE — Transfer of Care (Signed)
Immediate Anesthesia Transfer of Care Note  Patient: Brenda Wang  Procedure(s) Performed: COLONOSCOPY WITH PROPOFOL  Patient Location: PACU  Anesthesia Type:General  Level of Consciousness: drowsy  Airway & Oxygen Therapy: Patient Spontanous Breathing  Post-op Assessment: Report given to RN and Post -op Vital signs reviewed and stable  Post vital signs: Reviewed and stable  Last Vitals:  Vitals Value Taken Time  BP 100/72 04/11/21 1000  Temp    Pulse 85 04/11/21 1000  Resp 15 04/11/21 1000  SpO2 96 % 04/11/21 1000    Last Pain:  Vitals:   04/11/21 1000  TempSrc:   PainSc: 0-No pain         Complications: No notable events documented.

## 2021-04-11 NOTE — Anesthesia Preprocedure Evaluation (Signed)
Anesthesia Evaluation  Patient identified by MRN, date of birth, ID band Patient awake    Reviewed: Allergy & Precautions, NPO status , Patient's Chart, lab work & pertinent test results  History of Anesthesia Complications (+) PONV and history of anesthetic complications  Airway Mallampati: III  TM Distance: >3 FB Neck ROM: full    Dental  (+) Chipped   Pulmonary neg pulmonary ROS, neg shortness of breath,    Pulmonary exam normal        Cardiovascular Exercise Tolerance: Good (-) anginanegative cardio ROS Normal cardiovascular exam     Neuro/Psych  Headaches, Seizures -,  negative psych ROS   GI/Hepatic negative GI ROS, Neg liver ROS, neg GERD  ,  Endo/Other  negative endocrine ROS  Renal/GU negative Renal ROS  negative genitourinary   Musculoskeletal   Abdominal   Peds  Hematology negative hematology ROS (+)   Anesthesia Other Findings Past Medical History: No date: Anemia No date: Anxiety No date: H/O: cesarean section     Comment:  FTP and NRFHT No date: Headache     Comment:  migraines  No date: Migraine No date: PONV (postoperative nausea and vomiting)     Comment:  nausea and vomiting after 2nd c-section No date: Seizures ([place])     Comment:  as a child, none recently.  Past Surgical History: 01/23/2014: CESAREAN SECTION 08/10/2015: CESAREAN SECTION; N/A     Comment:  Procedure: CESAREAN SECTION;  Surgeon: Malachy Mood,              MD;  Location: [facility] ORS;  Service: Obstetrics;                Laterality: N/A; 09/26/2016: CESAREAN SECTION WITH BILATERAL TUBAL LIGATION; Bilateral     Comment:  Procedure: CESAREAN SECTION WITH BILATERAL TUBAL               LIGATION;  Surgeon: Malachy Mood, MD;  Location:               [facility] ORS;  Service: Obstetrics;  Laterality: Bilateral; 11/01/2020: COLONOSCOPY WITH PROPOFOL; N/A     Comment:  Procedure: COLONOSCOPY WITH PROPOFOL;  Surgeon: [name], MD;  Location: [facility] ENDOSCOPY;  Service:               Gastroenterology;  Laterality: N/A; 05/20/2019: CYSTOSCOPY; N/A     Comment:  Procedure: CYSTOSCOPY;  Surgeon: Gae Dry, MD;                Location: [facility] ORS;  Service: Gynecology;  Laterality:               N/A; 05/20/2019: LAPAROSCOPIC BILATERAL SALPINGECTOMY     Comment:  Procedure: LAPAROSCOPIC BILATERAL SALPINGECTOMY;                Surgeon: Gae Dry, MD;  Location: [facility] ORS;                Service: Gynecology;; 05/20/2019: LAPAROSCOPIC LYSIS OF ADHESIONS     Comment:  Procedure: LAPAROSCOPIC LYSIS OF ADHESIONS;  Surgeon:               Gae Dry, MD;  Location: [facility] ORS;  Service:               Gynecology;; 05/20/2019: LAPAROSCOPY; N/A     Comment:  Procedure: LAPAROSCOPY DIAGNOSTIC;  Surgeon: Kenton Kingfisher,  Linton Ham, MD;  Location: [facility] ORS;  Service: Gynecology;               Laterality: N/A; No date: THERAPEUTIC ABORTION No date: TONSILLECTOMY 09/26/2016: TUBAL LIGATION     Comment:  [facility] No date: TYMPANOSTOMY TUBE PLACEMENT; Bilateral No date: WISDOM TOOTH EXTRACTION  BMI    Body Mass Index: 34.33 kg/m      Reproductive/Obstetrics negative OB ROS                             Anesthesia Physical Anesthesia Plan  ASA: 3  Anesthesia Plan: General   Post-op Pain Management:    Induction: Intravenous  PONV Risk Score and Plan: Propofol infusion and TIVA  Airway Management Planned: Natural Airway and Nasal Cannula  Additional Equipment:   Intra-op Plan:   Post-operative Plan:   Informed Consent: I have reviewed the patients History and Physical, chart, labs and discussed the procedure including the risks, benefits and alternatives for the proposed anesthesia with the patient or authorized representative who has indicated his/her understanding and acceptance.     Dental Advisory Given  Plan Discussed with: Anesthesiologist, CRNA and  Surgeon  Anesthesia Plan Comments: (Patient consented for risks of anesthesia including but not limited to:  - adverse reactions to medications - risk of airway placement if required - damage to eyes, teeth, lips or other oral mucosa - nerve damage due to positioning  - sore throat or hoarseness - Damage to heart, brain, nerves, lungs, other parts of body or loss of life  Patient voiced understanding.)        Anesthesia Quick Evaluation

## 2021-04-11 NOTE — Anesthesia Postprocedure Evaluation (Signed)
Anesthesia Post Note  Patient: Brenda Wang  Procedure(s) Performed: COLONOSCOPY WITH PROPOFOL  Patient location during evaluation: Endoscopy Anesthesia Type: General Level of consciousness: awake and alert Pain management: pain level controlled Vital Signs Assessment: post-procedure vital signs reviewed and stable Respiratory status: spontaneous breathing, nonlabored ventilation, respiratory function stable and patient connected to nasal cannula oxygen Cardiovascular status: blood pressure returned to baseline and stable Postop Assessment: no apparent nausea or vomiting Anesthetic complications: no   No notable events documented.   Last Vitals:  Vitals:   04/11/21 1020 04/11/21 1030  BP: 126/78 123/76  Pulse: 65 (!) 59  Resp: 14 15  Temp:    SpO2: 100% 100%    Last Pain:  Vitals:   04/11/21 1030  TempSrc:   PainSc: 0-No pain                 Precious Haws Mindie Rawdon

## 2021-04-12 ENCOUNTER — Ambulatory Visit: Payer: BC Managed Care – PPO | Admitting: Adult Health

## 2021-04-12 ENCOUNTER — Encounter: Payer: Self-pay | Admitting: Gastroenterology

## 2021-04-29 ENCOUNTER — Ambulatory Visit: Payer: BC Managed Care – PPO | Admitting: Family

## 2021-05-20 ENCOUNTER — Ambulatory Visit: Payer: BC Managed Care – PPO | Admitting: Family

## 2021-06-14 ENCOUNTER — Ambulatory Visit: Payer: BC Managed Care – PPO | Admitting: Family

## 2021-08-25 ENCOUNTER — Emergency Department
Admission: EM | Admit: 2021-08-25 | Discharge: 2021-08-25 | Disposition: A | Discharge status: Home / Self Care | Payer: BC Managed Care – PPO | Attending: Emergency Medicine | Admitting: Emergency Medicine

## 2021-08-25 ENCOUNTER — Encounter: Payer: Self-pay | Admitting: Intensive Care

## 2021-08-25 ENCOUNTER — Other Ambulatory Visit: Payer: Self-pay

## 2021-08-25 DIAGNOSIS — R55 Syncope and collapse: Secondary | ICD-10-CM | POA: Diagnosis present

## 2021-08-25 LAB — CBC WITH DIFFERENTIAL/PLATELET
Abs Immature Granulocytes: 0.02 10*3/uL (ref 0.00–0.07)
Basophils Absolute: 0.1 10*3/uL (ref 0.0–0.1)
Basophils Relative: 1 %
Eosinophils Absolute: 0 10*3/uL (ref 0.0–0.5)
Eosinophils Relative: 0 %
HCT: 43.3 % (ref 36.0–46.0)
Hemoglobin: 13.9 g/dL (ref 12.0–15.0)
Immature Granulocytes: 0 %
Lymphocytes Relative: 13 %
Lymphs Abs: 1.5 10*3/uL (ref 0.7–4.0)
MCH: 28.4 pg (ref 26.0–34.0)
MCHC: 32.1 g/dL (ref 30.0–36.0)
MCV: 88.5 fL (ref 80.0–100.0)
Monocytes Absolute: 0.6 10*3/uL (ref 0.1–1.0)
Monocytes Relative: 5 %
Neutro Abs: 9.3 10*3/uL — ABNORMAL HIGH (ref 1.7–7.7)
Neutrophils Relative %: 81 %
Platelets: 297 10*3/uL (ref 150–400)
RBC: 4.89 MIL/uL (ref 3.87–5.11)
RDW: 14.1 % (ref 11.5–15.5)
WBC: 11.4 10*3/uL — ABNORMAL HIGH (ref 4.0–10.5)
nRBC: 0 % (ref 0.0–0.2)

## 2021-08-25 LAB — URINALYSIS, ROUTINE W REFLEX MICROSCOPIC
Bilirubin Urine: NEGATIVE
Glucose, UA: NEGATIVE mg/dL
Hgb urine dipstick: NEGATIVE
Ketones, ur: NEGATIVE mg/dL
Leukocytes,Ua: NEGATIVE
Nitrite: NEGATIVE
Protein, ur: NEGATIVE mg/dL
Specific Gravity, Urine: 1.012 (ref 1.005–1.030)
pH: 9 — ABNORMAL HIGH (ref 5.0–8.0)

## 2021-08-25 LAB — COMPREHENSIVE METABOLIC PANEL
ALT: 22 U/L (ref 0–44)
AST: 22 U/L (ref 15–41)
Albumin: 4.1 g/dL (ref 3.5–5.0)
Alkaline Phosphatase: 74 U/L (ref 38–126)
Anion gap: 11 (ref 5–15)
BUN: 10 mg/dL (ref 6–20)
CO2: 25 mmol/L (ref 22–32)
Calcium: 9.4 mg/dL (ref 8.9–10.3)
Chloride: 103 mmol/L (ref 98–111)
Creatinine, Ser: 0.92 mg/dL (ref 0.44–1.00)
GFR, Estimated: >60 mL/min (ref >60)
Glucose, Bld: 85 mg/dL (ref 70–99)
Potassium: 3.6 mmol/L (ref 3.5–5.1)
Sodium: 139 mmol/L (ref 135–145)
Total Bilirubin: 0.5 mg/dL (ref 0.3–1.2)
Total Protein: 7.5 g/dL (ref 6.5–8.1)

## 2021-08-25 LAB — PREGNANCY, URINE: Preg Test, Ur: NEGATIVE

## 2021-08-25 LAB — TROPONIN I (HIGH SENSITIVITY): Troponin I (High Sensitivity): <2 ng/L (ref <18)

## 2021-08-25 LAB — LIPASE, BLOOD: Lipase: 25 U/L (ref 11–51)

## 2021-08-25 NOTE — ED Provider Triage Note (Signed)
Emergency Medicine Provider Triage Evaluation Note  Brenda Wang , a 31 y.o. female  was evaluated in triage.  Pt complains of lightheadedness. Reports that she just started ozempic today around 2pm. Felt like she was going to pass out at 240pm but didn't completely loose consciousness. Reports that she was nauseous but no vomiting.   Review of Systems  Positive: lightheaded Negative: LOC, abd pain, CP  Physical Exam  There were no vitals taken for this visit. Gen:   Awake, no distress   Resp:  Normal effort  MSK:   Moves extremities without difficulty  Other:    Medical Decision Making  Medically screening exam initiated at 4:58 PM.  Appropriate orders placed.  Brenda Wang was informed that the remainder of the evaluation will be completed by another provider, this initial triage assessment does not replace that evaluation, and the importance of remaining in the ED until their evaluation is complete.     Brenda Old, PA-C 08/25/21 1713

## 2021-08-25 NOTE — ED Notes (Signed)
Pt to ED via [facility] from home for weakness. Per EMS pt was seen by her doctor today and given Ozempic injection and after getting in the injection she felt weak and went to her knees. EMS reports that pt was having some nausea but that has since resolved. Pt VSS with EMS. CBG 111. BP: 115/62, SpO2: 100% on room air. HR: 76.

## 2021-08-25 NOTE — ED Notes (Signed)
See triage note. Pt reports took ozempic today and about 30 minutes later started to feel extremely dizzy and nearly passed out. Pt alert, calm, resp reg/unlabored and skin dry. Visitor with pt.

## 2021-08-25 NOTE — ED Triage Notes (Signed)
Arrived by EMS from work. Patient received ozempic shot today at PCP and reports once getting back to work feeling lightheaded and nauseas. Denies LOC. Denies pain.

## 2021-08-25 NOTE — Discharge Instructions (Signed)
Your exam and labs are normal. You appear to have had a delayed injection reaction. Follow-up with your primary provider for continued care.

## 2021-08-25 NOTE — ED Notes (Signed)
Verbal d/c of repeat trop by provider JM.

## 2021-08-25 NOTE — ED Provider Notes (Signed)
[facility] Emergency Department Provider Note     Event Date/Time   First MD Initiated Contact with Patient 08/25/21 1844     (approximate)   History   Near Syncope   HPI  Brenda Wang is a 31 y.o. female with a diagnosis of pre-diabetes presents with a near-syncopal episode about 30 minutes after her initial dose of Ozempic.  Denies any current complaints at this time, reports improvement of her symptoms overall.      Physical Exam   Triage Vital Signs: ED Triage Vitals  Enc Vitals Group     BP 08/25/21 1659 140/76     Pulse Rate 08/25/21 1659 67     Resp 08/25/21 1659 16     Temp 08/25/21 1659 (!) 97.5 F (36.4 C)     Temp Source 08/25/21 1659 Oral     SpO2 08/25/21 1659 100 %     Weight 08/25/21 1700 220 lb (99.8 kg)     Height 08/25/21 1700 '5\' 4"'$  (1.626 m)     Head Circumference --      Peak Flow --      Pain Score 08/25/21 1700 0     Pain Loc --      Pain Edu? --      Excl. in [place]? --     Most recent vital signs: Vitals:   08/25/21 1659 08/25/21 2015  BP: 140/76 112/69  Pulse: 67 76  Resp: 16 17  Temp: (!) 97.5 F (36.4 C)   SpO2: 100% 100%    General Awake, no distress. NAD HEENT NCAT. PERRL. EOMI. No rhinorrhea. Mucous membranes are moist. CV:  Good peripheral perfusion.  RESP:  Normal effort.  ABD:  No distention.    ED Results / Procedures / Treatments   Labs (all labs ordered are listed, but only abnormal results are displayed) Labs Reviewed  CBC WITH DIFFERENTIAL/PLATELET - Abnormal; Notable for the following components:      Result Value   WBC 11.4 (*)    Neutro Abs 9.3 (*)    All other components within normal limits  URINALYSIS, ROUTINE W REFLEX MICROSCOPIC - Abnormal; Notable for the following components:   Color, Urine YELLOW (*)    APPearance CLEAR (*)    pH 9.0 (*)    All other components within normal limits  LIPASE, BLOOD  COMPREHENSIVE METABOLIC PANEL  PREGNANCY, URINE  TROPONIN I  (HIGH SENSITIVITY)     EKG  Vent. rate 69 BPM PR interval 140 ms QRS duration 94 ms QT/QTcB 410/439 ms P-R-T axes 57 0 32  RADIOLOGY   No results found.   PROCEDURES:  Critical Care performed: No  Procedures   MEDICATIONS ORDERED IN ED: Medications - No data to display   IMPRESSION / MDM / [place] / ED COURSE  I reviewed the triage vital signs and the nursing notes.                              Differential diagnosis includes, but is not limited to, syncope, dehydration, hypoglycemia, vasovagal response,  Patient's presentation is most consistent with acute complicated illness / injury requiring diagnostic workup.  Patient's diagnosis is consistent with likely vasovagal episode following injection medication.  Patient with the overall reassuring work-up including lab work and EKG without any acute findings.  EKG without evidence of malignant arrhythmia.  Patient is to follow up with primary care provider  as needed or otherwise directed. Patient is given ED precautions to return to the ED for any worsening or new symptoms.     FINAL CLINICAL IMPRESSION(S) / ED DIAGNOSES   Final diagnoses:  Near syncope     Rx / DC Orders   ED Discharge Orders     None        Note:  This document was prepared using Dragon voice recognition software and may include unintentional dictation errors.    Melvenia Needles, PA-C 08/30/21 2006    Lucrezia Starch, MD 08/31/21 1030

## 2021-08-25 NOTE — ED Notes (Signed)
Pt attempted to sign for d/c paperwork but topaz frozen.

## 2021-12-10 ENCOUNTER — Ambulatory Visit
Admission: EM | Admit: 2021-12-10 | Discharge: 2021-12-10 | Disposition: A | Discharge status: Home / Self Care | Payer: BC Managed Care – PPO

## 2021-12-10 DIAGNOSIS — H65192 Other acute nonsuppurative otitis media, left ear: Secondary | ICD-10-CM

## 2021-12-10 DIAGNOSIS — J069 Acute upper respiratory infection, unspecified: Secondary | ICD-10-CM | POA: Diagnosis not present

## 2021-12-10 MED ORDER — AMOXICILLIN-POT CLAVULANATE 875-125 MG PO TABS: 1.0000 | ORAL_TABLET | Freq: Two times a day (BID) | ORAL | 0 refills | Status: DC

## 2021-12-10 NOTE — ED Triage Notes (Signed)
Pt. Presents to UC w/ c/o bilateral ear pain, drainage and fullness for the past two weeks.

## 2021-12-10 NOTE — Discharge Instructions (Signed)
You have an upper respiratory infection as well as left ear infection.  I am treating both of these with an antibiotic.  You may follow-up if symptoms persist or worsen.

## 2021-12-10 NOTE — ED Provider Notes (Signed)
Roderic Palau    CSN: [id number] Arrival date & time: 12/10/21  1057      History   Chief Complaint Chief Complaint  Patient presents with   Otalgia   Ear Drainage   Ear Fullness    HPI Brenda Wang is a 31 y.o. female.   Patient presents with bilateral ear pain, feelings of ear pressure and drainage, and nasal congestion that has been present for about 2 weeks.  Patient reports that she had a cough at the start of symptoms that has now resolved.  Denies any associated fever.  Denies chest pain, shortness of breath, sore throat, nausea, vomiting, diarrhea, abdominal pain.  Patient has taken several over-the-counter medications including Mucinex with minimal improvement of symptoms.   Otalgia Ear Drainage  Ear Fullness    Past Medical History:  Diagnosis Date   Anemia    Anxiety    H/O: cesarean section    FTP and NRFHT   Headache    migraines    Migraine    PONV (postoperative nausea and vomiting)    nausea and vomiting after 2nd c-section   Seizures ([place])    as a child, none recently.    Patient Active Problem List   Diagnosis Date Noted   Chronic migraine w/o aura w/o status migrainosus, not intractable 01/27/2020   Pelvic pain 04/15/2019   Dyspareunia due to medical condition in female 04/15/2019   History of cesarean section 08/10/2015   Irregular menses 01/28/2013    Past Surgical History:  Procedure Laterality Date   CESAREAN SECTION  01/23/2014   CESAREAN SECTION N/A 08/10/2015   Procedure: CESAREAN SECTION;  Surgeon: Malachy Mood, MD;  Location: [facility] ORS;  Service: Obstetrics;  Laterality: N/A;   CESAREAN SECTION WITH BILATERAL TUBAL LIGATION Bilateral 09/26/2016   Procedure: CESAREAN SECTION WITH BILATERAL TUBAL LIGATION;  Surgeon: Malachy Mood, MD;  Location: [facility] ORS;  Service: Obstetrics;  Laterality: Bilateral;   COLONOSCOPY WITH PROPOFOL N/A 11/01/2020   Procedure: COLONOSCOPY WITH PROPOFOL;  Surgeon: [name], MD;   Location: [facility] ENDOSCOPY;  Service: Gastroenterology;  Laterality: N/A;   COLONOSCOPY WITH PROPOFOL N/A 04/11/2021   Procedure: COLONOSCOPY WITH PROPOFOL;  Surgeon: [name], MD;  Location: [facility] ENDOSCOPY;  Service: Gastroenterology;  Laterality: N/A;   CYSTOSCOPY N/A 05/20/2019   Procedure: CYSTOSCOPY;  Surgeon: Gae Dry, MD;  Location: [facility] ORS;  Service: Gynecology;  Laterality: N/A;   LAPAROSCOPIC BILATERAL SALPINGECTOMY  05/20/2019   Procedure: LAPAROSCOPIC BILATERAL SALPINGECTOMY;  Surgeon: Gae Dry, MD;  Location: [facility] ORS;  Service: Gynecology;;   LAPAROSCOPIC LYSIS OF ADHESIONS  05/20/2019   Procedure: LAPAROSCOPIC LYSIS OF ADHESIONS;  Surgeon: Gae Dry, MD;  Location: [facility] ORS;  Service: Gynecology;;   LAPAROSCOPY N/A 05/20/2019   Procedure: LAPAROSCOPY DIAGNOSTIC;  Surgeon: Gae Dry, MD;  Location: [facility] ORS;  Service: Gynecology;  Laterality: N/A;   THERAPEUTIC ABORTION     TONSILLECTOMY     TUBAL LIGATION  09/26/2016   [facility]   TYMPANOSTOMY TUBE PLACEMENT Bilateral    WISDOM TOOTH EXTRACTION      OB History     Gravida  3   Para  3   Term  3   Preterm      AB      Living  3      SAB      IAB      Ectopic      Multiple  0   Live Births  3  Home Medications    Prior to Admission medications   Medication Sig Start Date End Date Taking? Authorizing Provider  amoxicillin-clavulanate (AUGMENTIN) 875-125 MG tablet Take 1 tablet by mouth every 12 (twelve) hours. 12/10/21  Yes Termaine Roupp, Hildred Alamin E, FNP  SUMAtriptan (IMITREX) 100 MG tablet Take by mouth. 04/20/21 04/20/22 Yes [provider]  benzonatate (TESSALON) 200 MG capsule TAKE 1 CAPSULE BY MOUTH EVERY DAY 3 TIMES A DAY AS NEEDED FOR COUGH    [provider]  Cholecalciferol (VITAMIN D-1000 MAX ST) 25 MCG (1000 UT) tablet Take by mouth.    [provider]  cyclobenzaprine (FLEXERIL) 10 MG tablet Take 1 tablet by mouth at bedtime as needed.     [provider]  ferrous sulfate 325 (65 FE) MG tablet Take 325 mg by mouth daily with breakfast.    [provider]  metoCLOPramide (REGLAN) 10 MG tablet TAKE 1 TABLET BY MOUTH 8 HOURS AS NEEDED FOR NAUSEA    [provider]  nortriptyline (PAMELOR) 10 MG capsule Take 2 capsules by mouth at bedtime.    [provider]  ondansetron (ZOFRAN ODT) 4 MG disintegrating tablet Take 1 tablet (4 mg total) by mouth every 8 (eight) hours as needed. 01/27/20   Marcial Pacas, MD  OZEMPIC, 0.25 OR 0.5 MG/DOSE, 2 MG/3ML SOPN SMARTSIG:0.5 Milligram(s) Topical Once a Week 10/14/21   [provider]  Plecanatide (TRULANCE) 3 MG TABS Take 1 tablet by mouth daily. 11/09/20   [name], MD  promethazine (PHENERGAN) 25 MG tablet Take 25 mg by mouth every 8 (eight) hours as needed. 09/26/21   [provider]  rizatriptan (MAXALT) 10 MG tablet Take by mouth. 09/26/21   [provider]  SUMAtriptan (IMITREX) 50 MG tablet Take 1 tablet (50 mg total) by mouth every 2 (two) hours as needed. May repeat in 2 hours if headache persists or recurs. 01/27/20   Marcial Pacas, MD  triamcinolone ointment (KENALOG) 0.1 % APPLY TO AFFECTED AREA TWICE A DAY    [provider]  venlafaxine XR (EFFEXOR-XR) 37.5 MG 24 hr capsule Take 37.5 mg by mouth daily. 09/01/20   [provider]  Vitamin D, Ergocalciferol, (DRISDOL) 1.25 MG (50000 UNIT) CAPS capsule Take 50,000 Units by mouth once a week. 08/26/21   [provider]    Family History Family History  Problem Relation Age of Onset   Anemia Mother    Anxiety disorder Mother    Kidney disease Father    Colon cancer Paternal Grandfather    Hypertension Paternal Grandmother    Diabetes Paternal Grandmother     Social History Social History   Tobacco Use   Smoking status: Never   Smokeless tobacco: Never  Vaping Use   Vaping Use: Never used  Substance Use Topics   Alcohol use: Yes    Alcohol/week:  7.0 standard drinks of alcohol    Types: 7 Glasses of wine per week    Comment: glass of wine daily   Drug use: Not Currently    Types: Marijuana    Comment: socially     Allergies   Sulfa antibiotics   Review of Systems Review of Systems Per HPI  Physical Exam Triage Vital Signs ED Triage Vitals  Enc Vitals Group     BP 12/10/21 1111 106/68     Pulse Rate 12/10/21 1111 89     Resp 12/10/21 1111 16     Temp 12/10/21 1111 98.1 F (36.7 C)  Temp src --      SpO2 12/10/21 1111 98 %     Weight --      Height --      Head Circumference --      Peak Flow --      Pain Score 12/10/21 1112 7     Pain Loc --      Pain Edu? --      Excl. in [place]? --    No data found.  Updated Vital Signs BP 106/68   Pulse 89   Temp 98.1 F (36.7 C)   Resp 16   LMP 11/28/2021 (Approximate)   SpO2 98%   Visual Acuity Right Eye Distance:   Left Eye Distance:   Bilateral Distance:    Right Eye Near:   Left Eye Near:    Bilateral Near:     Physical Exam Constitutional:      General: She is not in acute distress.    Appearance: Normal appearance. She is not toxic-appearing or diaphoretic.  HENT:     Head: Normocephalic and atraumatic.     Right Ear: Ear canal normal. No drainage, swelling or tenderness. A middle ear effusion is present. There is no impacted cerumen. No foreign body. No mastoid tenderness. Tympanic membrane is not perforated, erythematous or bulging.     Left Ear: Ear canal normal. No drainage, swelling or tenderness.  No middle ear effusion. There is no impacted cerumen. No foreign body. No mastoid tenderness. Tympanic membrane is erythematous. Tympanic membrane is not perforated or bulging.     Nose: Congestion present.     Mouth/Throat:     Mouth: Mucous membranes are moist.     Pharynx: No posterior oropharyngeal erythema.  Eyes:     Extraocular Movements: Extraocular movements intact.     Conjunctiva/sclera: Conjunctivae normal.     Pupils: Pupils are  equal, round, and reactive to light.  Cardiovascular:     Rate and Rhythm: Normal rate and regular rhythm.     Pulses: Normal pulses.     Heart sounds: Normal heart sounds.  Pulmonary:     Effort: Pulmonary effort is normal. No respiratory distress.     Breath sounds: Normal breath sounds. No stridor. No wheezing, rhonchi or rales.  Abdominal:     General: Abdomen is flat. Bowel sounds are normal.     Palpations: Abdomen is soft.  Musculoskeletal:        General: Normal range of motion.     Cervical back: Normal range of motion.  Skin:    General: Skin is warm and dry.  Neurological:     General: No focal deficit present.     Mental Status: She is alert and oriented to person, place, and time. Mental status is at baseline.  Psychiatric:        Mood and Affect: Mood normal.        Behavior: Behavior normal.      UC Treatments / Results  Labs (all labs ordered are listed, but only abnormal results are displayed) Labs Reviewed - No data to display  EKG   Radiology No results found.  Procedures Procedures (including critical care time)  Medications Ordered in UC Medications - No data to display  Initial Impression / Assessment and Plan / UC Course  I have reviewed the triage vital signs and the nursing notes.  Pertinent labs & imaging results that were available during my care of the patient were reviewed by me and considered in my  medical decision making (see chart for details).     Patient has persistent upper respiratory infection with symptoms over 10 days which is concerning for secondary bacterial infection.  Patient also has left otitis media.  Given this, will treat both of these with Augmentin antibiotic.  Advised patient to follow-up if symptoms persist or worsen.  Patient verbalized understanding and was agreeable with plan. Final Clinical Impressions(s) / UC Diagnoses   Final diagnoses:  Acute upper respiratory infection  Other non-recurrent acute  nonsuppurative otitis media of left ear     Discharge Instructions      You have an upper respiratory infection as well as left ear infection.  I am treating both of these with an antibiotic.  You may follow-up if symptoms persist or worsen.    ED Prescriptions     Medication Sig Dispense Auth. Provider   amoxicillin-clavulanate (AUGMENTIN) 875-125 MG tablet Take 1 tablet by mouth every 12 (twelve) hours. 14 tablet [place], Michele Rockers, [place]      PDMP not reviewed this encounter.   Teodora Medici, [place] 12/10/21 1130

## 2021-12-28 ENCOUNTER — Encounter: Payer: BC Managed Care – PPO | Admitting: Licensed Practical Nurse

## 2022-02-07 ENCOUNTER — Encounter: Payer: Self-pay | Admitting: Obstetrics and Gynecology

## 2022-02-07 ENCOUNTER — Other Ambulatory Visit (HOSPITAL_COMMUNITY)
Admission: RE | Admit: 2022-02-07 | Discharge: 2022-02-07 | Disposition: A | Discharge status: Home / Self Care | Payer: BC Managed Care – PPO | Source: Ambulatory Visit | Attending: Licensed Practical Nurse | Admitting: Licensed Practical Nurse

## 2022-02-07 ENCOUNTER — Ambulatory Visit (INDEPENDENT_AMBULATORY_CARE_PROVIDER_SITE_OTHER): Payer: BC Managed Care – PPO | Admitting: Obstetrics and Gynecology

## 2022-02-07 VITALS — BP 109/74 | HR 75 | Ht 64.0 in | Wt 184.2 lb

## 2022-02-07 DIAGNOSIS — Z124 Encounter for screening for malignant neoplasm of cervix: Secondary | ICD-10-CM

## 2022-02-07 DIAGNOSIS — Z01419 Encounter for gynecological examination (general) (routine) without abnormal findings: Secondary | ICD-10-CM | POA: Diagnosis present

## 2022-02-07 NOTE — Progress Notes (Signed)
HPI:      Ms. Brenda Wang is a 31 y.o. [phone] who LMP was Patient's last menstrual period was 01/18/2022.  Subjective:   She presents today for her annual examination.  She has no complaints.  She is wondering about her menses.  She gets it at the same time each month but some months it is only a couple days long and and some months at lasts 5 days.  As long as she is reassured that this is normal she does not want to change her cycle by taking OCPs IUD or other cycle control methods. She continues to have occasional pelvic pain but is not bad. 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      Hx: The following portions of the patient's history were reviewed and updated as appropriate:             She  has a past medical history of Anemia, Anxiety, H/O: cesarean section, Headache, Migraine, PONV (postoperative nausea and vomiting), and Seizures ([place]). She does not have any pertinent problems on file. She  has a past surgical history that includes Therapeutic abortion; Cesarean section (01/23/2014); Wisdom tooth extraction; Cesarean section (N/A, 08/10/2015); Cesarean section with bilateral tubal ligation (Bilateral, 09/26/2016); Tubal ligation (09/26/2016); Tonsillectomy; Tympanostomy tube placement (Bilateral); laparoscopy (N/A, 05/20/2019); Cystoscopy (N/A, 05/20/2019); Laparoscopic bilateral salpingectomy (05/20/2019); Laparoscopic lysis of adhesions (05/20/2019); Colonoscopy with propofol (N/A, 11/01/2020); and Colonoscopy with propofol (N/A, 04/11/2021). Her family history includes Anemia in her mother; Anxiety disorder in her mother; Colon cancer in her paternal grandfather; Diabetes in her paternal grandmother; Hypertension in her paternal grandmother; Kidney disease in her father. She  reports that she has never smoked. She has never used smokeless tobacco. She reports current alcohol use of about  7.0 standard drinks of alcohol per week. She reports that she does not currently use drugs after having used the following drugs: Marijuana. She has a current medication list which includes the following prescription(s): benzonatate, vitamin d-1000 max st, cyclobenzaprine, ferrous sulfate, metoclopramide, ozempic (0.25 or 0.5 mg/dose), trulance, rizatriptan, sumatriptan, sumatriptan, triamcinolone ointment, venlafaxine xr, vitamin d (ergocalciferol), and ondansetron. She is allergic to sulfa antibiotics.       Review of Systems:  Review of Systems  Constitutional: Denied constitutional symptoms, night sweats, recent illness, fatigue, fever, insomnia and weight loss.  Eyes: Denied eye symptoms, eye pain, photophobia, vision change and visual disturbance.  Ears/Nose/Throat/Neck: Denied ear, nose, throat or neck symptoms, hearing loss, nasal discharge, sinus congestion and sore throat.  Cardiovascular: Denied cardiovascular symptoms, arrhythmia, chest pain/pressure, edema, exercise intolerance, orthopnea and palpitations.  Respiratory: Denied pulmonary symptoms, asthma, pleuritic pain, productive sputum, cough, dyspnea and wheezing.  Gastrointestinal: Denied, gastro-esophageal reflux, melena, nausea and vomiting.  Genitourinary: Denied genitourinary symptoms including symptomatic vaginal discharge, pelvic relaxation issues, and urinary complaints.  Musculoskeletal: Denied musculoskeletal symptoms, stiffness, swelling, muscle weakness and myalgia.  Dermatologic: Denied dermatology symptoms, rash and scar.  Neurologic: Denied neurology symptoms, dizziness, headache, neck pain and syncope.  Psychiatric: Denied psychiatric symptoms, anxiety and depression.  Endocrine: Denied endocrine symptoms including hot flashes and night sweats.   Meds:   Current Outpatient Medications on File Prior to Visit  Medication Sig Dispense Refill   benzonatate (TESSALON) 200 MG capsule TAKE 1 CAPSULE BY MOUTH EVERY DAY  3 TIMES A DAY AS NEEDED FOR COUGH     Cholecalciferol (VITAMIN D-1000 MAX ST) 25 MCG (  1000 UT) tablet Take by mouth.     cyclobenzaprine (FLEXERIL) 10 MG tablet Take 1 tablet by mouth at bedtime as needed.     ferrous sulfate 325 (65 FE) MG tablet Take 325 mg by mouth daily with breakfast.     metoCLOPramide (REGLAN) 10 MG tablet TAKE 1 TABLET BY MOUTH 8 HOURS AS NEEDED FOR NAUSEA     OZEMPIC, 0.25 OR 0.5 MG/DOSE, 2 MG/3ML SOPN SMARTSIG:0.5 Milligram(s) Topical Once a Week     Plecanatide (TRULANCE) 3 MG TABS Take 1 tablet by mouth daily. 90 tablet 3   rizatriptan (MAXALT) 10 MG tablet Take by mouth.     SUMAtriptan (IMITREX) 100 MG tablet Take by mouth.     SUMAtriptan (IMITREX) 50 MG tablet Take 1 tablet (50 mg total) by mouth every 2 (two) hours as needed. May repeat in 2 hours if headache persists or recurs. 12 tablet 6   triamcinolone ointment (KENALOG) 0.1 % APPLY TO AFFECTED AREA TWICE A DAY     venlafaxine XR (EFFEXOR-XR) 37.5 MG 24 hr capsule Take 37.5 mg by mouth daily.     Vitamin D, Ergocalciferol, (DRISDOL) 1.25 MG (50000 UNIT) CAPS capsule Take 50,000 Units by mouth once a week.     ondansetron (ZOFRAN ODT) 4 MG disintegrating tablet Take 1 tablet (4 mg total) by mouth every 8 (eight) hours as needed. (Patient not taking: Reported on 02/07/2022) 20 tablet 6   No current facility-administered medications on file prior to visit.     Objective:     Vitals:   02/07/22 0911  BP: 109/74  Pulse: 75    Filed Weights   02/07/22 0911  Weight: 184 lb 3.2 oz (83.6 kg)              Physical examination General NAD, Conversant  HEENT Atraumatic; Op clear with mmm.  Normo-cephalic. Pupils reactive. Anicteric sclerae  Thyroid/Neck Smooth without nodularity or enlargement. Normal ROM.  Neck Supple.  Skin No rashes, lesions or ulceration. Normal palpated skin turgor. No nodularity.  Breasts: No masses or discharge.  Symmetric.  No axillary adenopathy.  Lungs: Clear to  auscultation.No rales or wheezes. Normal Respiratory effort, no retractions.  Heart: NSR.  No murmurs or rubs appreciated. No periferal edema  Abdomen: Soft.  Non-tender.  No masses.  No HSM. No hernia  Extremities: Moves all appropriately.  Normal ROM for age. No lymphadenopathy.  Neuro: Oriented to PPT.  Normal mood. Normal affect.     Pelvic:   Vulva: Normal appearance.  No lesions.  Vagina: No lesions or abnormalities noted.  Support: Normal pelvic support.  Urethra No masses tenderness or scarring.  Meatus Normal size without lesions or prolapse.  Cervix: Normal appearance.  No lesions.  Anus: Normal exam.  No lesions.  Perineum: Normal exam.  No lesions.        Bimanual   Uterus: Normal size.  Non-tender.  Mobile.  AV.  Adnexae: No masses.  Non-tender to palpation.  Cul-de-sac: Negative for abnormality.     Assessment:    [id number] Patient Active Problem List   Diagnosis Date Noted   Chronic migraine w/o aura w/o status migrainosus, not intractable 01/27/2020   Pelvic pain 04/15/2019   Dyspareunia due to medical condition in female 04/15/2019   History of cesarean section 08/10/2015   Irregular menses 01/28/2013     1. Well woman exam with routine gynecological exam   2. Cervical cancer screening        Plan:  1.  Basic Screening Recommendations The basic screening recommendations for asymptomatic women were discussed with the patient during her visit.  The age-appropriate recommendations were discussed with her and the rational for the tests reviewed.  When I am informed by the patient that another primary care physician has previously obtained the age-appropriate tests and they are up-to-date, only outstanding tests are ordered and referrals given as necessary.  Abnormal results of tests will be discussed with her when all of her results are completed.  Routine preventative health maintenance measures emphasized: Exercise/Diet/Weight control, Tobacco  Warnings, Alcohol/Substance use risks and Stress Management Pap performed Orders No orders of the defined types were placed in this encounter.   No orders of the defined types were placed in this encounter.         F/U  Return in about 1 year (around 02/08/2023) for Annual Physical.  Finis Bud, M.D. 02/07/2022 9:26 AM

## 2022-02-07 NOTE — Progress Notes (Signed)
Patients presents for annual exam today. She states irregular menstrual cycles and would like to discuss options of cycle control. Patient is due for pap smear, ordered. She states no other questions or concerns at this time.

## 2022-02-09 LAB — CYTOLOGY - PAP
Comment: NEGATIVE
Diagnosis: NEGATIVE
High risk HPV: NEGATIVE

## 2022-03-27 ENCOUNTER — Other Ambulatory Visit: Payer: Self-pay | Admitting: Family

## 2022-03-27 DIAGNOSIS — R7303 Prediabetes: Secondary | ICD-10-CM

## 2022-03-28 ENCOUNTER — Ambulatory Visit: Payer: BC Managed Care – PPO

## 2022-03-29 ENCOUNTER — Ambulatory Visit (INDEPENDENT_AMBULATORY_CARE_PROVIDER_SITE_OTHER): Payer: BC Managed Care – PPO | Admitting: Family

## 2022-03-29 ENCOUNTER — Encounter: Payer: Self-pay | Admitting: Family

## 2022-03-29 VITALS — BP 118/80 | HR 78 | Ht 64.0 in | Wt 181.4 lb

## 2022-03-29 DIAGNOSIS — R197 Diarrhea, unspecified: Secondary | ICD-10-CM | POA: Diagnosis not present

## 2022-03-29 DIAGNOSIS — R112 Nausea with vomiting, unspecified: Secondary | ICD-10-CM | POA: Diagnosis not present

## 2022-03-29 DIAGNOSIS — R509 Fever, unspecified: Secondary | ICD-10-CM

## 2022-03-29 LAB — POCT URINALYSIS DIPSTICK
Bilirubin, UA: NEGATIVE
Blood, UA: NEGATIVE
Glucose, UA: NEGATIVE
Ketones, UA: NEGATIVE
Leukocytes, UA: NEGATIVE
Nitrite, UA: NEGATIVE
Protein, UA: NEGATIVE
Spec Grav, UA: 1.02 (ref 1.010–1.025)
Urobilinogen, UA: 0.2 E.U./dL
pH, UA: 6 (ref 5.0–8.0)

## 2022-03-29 NOTE — Progress Notes (Signed)
Acute Office Visit  Subjective:     Patient ID: Brenda Wang, female    DOB: 09/09/1990, 32 y.o.   MRN: [id number]  Chief Complaint  Patient presents with   Vomiting   Nausea   Sore Throat    Sore Throat  This is a new problem. The current episode started 1 to 4 weeks ago. The problem has been waxing and waning. Neither side of throat is experiencing more pain than the other. There has been no fever. Associated symptoms include diarrhea, headaches and vomiting. She has had no exposure to strep or mono. The treatment provided no relief.  Emesis  This is a new problem. The current episode started in the past 7 days. The problem occurs 2 to 4 times per day. The problem has been unchanged. The emesis has an appearance of bile and stomach contents. There has been no fever. Associated symptoms include chills, diarrhea and headaches. She has tried nothing for the symptoms. The treatment provided no relief.     Review of Systems  Constitutional:  Positive for chills, diaphoresis and malaise/fatigue.  Gastrointestinal:  Positive for diarrhea and vomiting.  Neurological:  Positive for headaches.        Objective:    BP 118/80   Pulse 78   Ht 5' 4"$  (1.626 m)   Wt 181 lb 6.4 oz (82.3 kg)   SpO2 97%   BMI 31.14 kg/m   Physical Exam Vitals and nursing note reviewed.  Constitutional:      General: She is not in acute distress.    Appearance: Normal appearance. She is ill-appearing. She is not toxic-appearing or diaphoretic.  Eyes:     Pupils: Pupils are equal, round, and reactive to light.  Cardiovascular:     Rate and Rhythm: Normal rate and regular rhythm.  Pulmonary:     Effort: Pulmonary effort is normal.     Breath sounds: Normal breath sounds.  Abdominal:     General: Bowel sounds are normal. There is no distension.     Tenderness: There is no abdominal tenderness.  Neurological:     Mental Status: She is alert.     Results for orders placed or performed in  visit on 03/29/22  POCT Urinalysis Dipstick  Result Value Ref Range   Color, UA Yellow    Clarity, UA     Glucose, UA Negative Negative   Bilirubin, UA Negative    Ketones, UA Negative    Spec Grav, UA 1.020 1.010 - 1.025   Blood, UA Negative    pH, UA 6.0 5.0 - 8.0   Protein, UA Negative Negative   Urobilinogen, UA 0.2 0.2 or 1.0 E.U./dL   Nitrite, UA Negative    Leukocytes, UA Negative Negative   Appearance     Odor          Assessment & Plan:   Problem List Items Addressed This Visit   None Visit Diagnoses     Nausea and vomiting, unspecified vomiting type    -  Primary   Relevant Orders   CBC with Diff   CMP14+EGFR   Culture, Stool   GI Profile, Stool, PCR- [facility]   Ova and parasite examination   POCT Urinalysis Dipstick (Completed)   Diarrhea, unspecified type       Relevant Orders   Culture, Stool   GI Profile, Stool, PCR- [facility]   Ova and parasite examination   Fever, unspecified fever cause       Relevant Orders  POCT Urinalysis Dipstick (Completed)      UA in office today WNL.  Getting CBC and CMP to evaluate for possible infection.  Also gave pt supplies to return with a stool sample. Will decide next steps based on results.    No follow-ups on file.  Total time spent: 20 minutes  Mechele Claude, FNP  03/29/2022

## 2022-03-30 LAB — CBC WITH DIFFERENTIAL/PLATELET
Basophils Absolute: 0.1 10*3/uL (ref 0.0–0.2)
Basos: 1 %
EOS (ABSOLUTE): 0 10*3/uL (ref 0.0–0.4)
Eos: 1 %
Hematocrit: 36.4 % (ref 34.0–46.6)
Hemoglobin: 12 g/dL (ref 11.1–15.9)
Immature Grans (Abs): 0 10*3/uL (ref 0.0–0.1)
Immature Granulocytes: 0 %
Lymphocytes Absolute: 2.3 10*3/uL (ref 0.7–3.1)
Lymphs: 32 %
MCH: 28.8 pg (ref 26.6–33.0)
MCHC: 33 g/dL (ref 31.5–35.7)
MCV: 87 fL (ref 79–97)
Monocytes Absolute: 0.4 10*3/uL (ref 0.1–0.9)
Monocytes: 6 %
Neutrophils Absolute: 4.5 10*3/uL (ref 1.4–7.0)
Neutrophils: 60 %
Platelets: 266 10*3/uL (ref 150–450)
RBC: 4.17 x10E6/uL (ref 3.77–5.28)
RDW: 13.3 % (ref 11.7–15.4)
WBC: 7.3 10*3/uL (ref 3.4–10.8)

## 2022-03-30 LAB — CMP14+EGFR
ALT: 16 IU/L (ref 0–32)
AST: 15 IU/L (ref 0–40)
Albumin/Globulin Ratio: 1.6 (ref 1.2–2.2)
Albumin: 3.9 g/dL (ref 3.9–4.9)
Alkaline Phosphatase: 67 IU/L (ref 44–121)
BUN/Creatinine Ratio: 12 (ref 9–23)
BUN: 9 mg/dL (ref 6–20)
Bilirubin Total: 0.3 mg/dL (ref 0.0–1.2)
CO2: 23 mmol/L (ref 20–29)
Calcium: 8.7 mg/dL (ref 8.7–10.2)
Chloride: 107 mmol/L — ABNORMAL HIGH (ref 96–106)
Creatinine, Ser: 0.76 mg/dL (ref 0.57–1.00)
Globulin, Total: 2.4 g/dL (ref 1.5–4.5)
Glucose: 61 mg/dL — ABNORMAL LOW (ref 70–99)
Potassium: 3.7 mmol/L (ref 3.5–5.2)
Sodium: 142 mmol/L (ref 134–144)
Total Protein: 6.3 g/dL (ref 6.0–8.5)
eGFR: 107 mL/min/{1.73_m2} (ref >59)

## 2022-04-04 LAB — GI PROFILE, STOOL, PCR

## 2022-04-04 LAB — STOOL CULTURE: E coli, Shiga toxin Assay: NEGATIVE

## 2022-04-05 ENCOUNTER — Encounter: Payer: Self-pay | Admitting: Family

## 2022-04-12 ENCOUNTER — Ambulatory Visit: Payer: BC Managed Care – PPO | Admitting: Family

## 2022-05-30 ENCOUNTER — Other Ambulatory Visit: Payer: Self-pay | Admitting: Family

## 2022-05-30 DIAGNOSIS — R7303 Prediabetes: Secondary | ICD-10-CM

## 2022-06-27 ENCOUNTER — Encounter: Payer: Self-pay | Admitting: Family

## 2022-06-29 ENCOUNTER — Ambulatory Visit
Admission: EM | Admit: 2022-06-29 | Discharge: 2022-06-29 | Disposition: A | Discharge status: Home / Self Care | Payer: BC Managed Care – PPO | Attending: Urgent Care | Admitting: Urgent Care

## 2022-06-29 DIAGNOSIS — R519 Headache, unspecified: Secondary | ICD-10-CM | POA: Diagnosis not present

## 2022-06-29 LAB — POCT FASTING CBG KUC MANUAL ENTRY: POCT Glucose (KUC): 105 mg/dL — AB (ref 70–99)

## 2022-06-29 MED ORDER — KETOROLAC TROMETHAMINE 30 MG/ML IJ SOLN
30.0000 mg | Freq: Once | INTRAMUSCULAR | Status: AC
  Administered 2022-06-29: 30 mg via INTRAMUSCULAR

## 2022-06-29 MED ORDER — PROMETHAZINE HCL 25 MG PO TABS: 25.0000 mg | ORAL_TABLET | Freq: Four times a day (QID) | ORAL | 0 refills | Status: DC | PRN

## 2022-06-29 MED ORDER — DEXAMETHASONE SODIUM PHOSPHATE 10 MG/ML IJ SOLN
10.0000 mg | Freq: Once | INTRAMUSCULAR | Status: AC
  Administered 2022-06-29: 10 mg via INTRAMUSCULAR

## 2022-06-29 NOTE — Discharge Instructions (Addendum)
Please follow-up with your neurologist or PCP.

## 2022-06-29 NOTE — ED Triage Notes (Signed)
Patient presents to UC for HA and shakes today. Hx of migraines. States she is also on ozempic and has missed her dose so concerned if this is a side effect from skipping a dose. Did not take any medications.

## 2022-06-29 NOTE — ED Provider Notes (Signed)
Renaldo Fiddler    CSN: [id number] Arrival date & time: 06/29/22  1236      History   Chief Complaint Chief Complaint  Patient presents with   Headache    HPI FARRAN SANKER is a 32 y.o. female.    Headache   Presents to urgent care with complaint of headache and "shakes".  Significant past medical history of headache, seizure, migraine.  She also states that she uses Ozempic to treat prediabetes.  She states she  wonders if this is a medication reaction to a skipped dose. Symptoms starting today at work.  Review of her chart shows an ED visit on 08/25/2021 for near syncope following initial dose of Ozempic.  Migraine previously treated by [facility] with venlafaxine but she states this medicine was stopped.  She does have sumatriptan at home but has not taken any doses as she did not have any with her.  Past Medical History:  Diagnosis Date   Anemia    Anxiety    H/O: cesarean section    FTP and NRFHT   Headache    migraines    Migraine    PONV (postoperative nausea and vomiting)    nausea and vomiting after 2nd c-section   Seizures (HCC)    as a child, none recently.    Patient Active Problem List   Diagnosis Date Noted   Chronic migraine w/o aura w/o status migrainosus, not intractable 01/27/2020   Pelvic pain 04/15/2019   Dyspareunia due to medical condition in female 04/15/2019   History of cesarean section 08/10/2015   Irregular menses 01/28/2013    Past Surgical History:  Procedure Laterality Date   CESAREAN SECTION  01/23/2014   CESAREAN SECTION N/A 08/10/2015   Procedure: CESAREAN SECTION;  Surgeon: Vena Austria, MD;  Location: [facility] ORS;  Service: Obstetrics;  Laterality: N/A;   CESAREAN SECTION WITH BILATERAL TUBAL LIGATION Bilateral 09/26/2016   Procedure: CESAREAN SECTION WITH BILATERAL TUBAL LIGATION;  Surgeon: Vena Austria, MD;  Location: [facility]S;  Service: Obstetrics;  Laterality: Bilateral;   COLONOSCOPY WITH PROPOFOL N/A  11/01/2020   Procedure: COLONOSCOPY WITH PROPOFOL;  Surgeon: Wyline Mood, MD;  Location: [facility] ENDOSCOPY;  Service: Gastroenterology;  Laterality: N/A;   COLONOSCOPY WITH PROPOFOL N/A 04/11/2021   Procedure: COLONOSCOPY WITH PROPOFOL;  Surgeon: Wyline Mood, MD;  Location: [facility] ENDOSCOPY;  Service: Gastroenterology;  Laterality: N/A;   CYSTOSCOPY N/A 05/20/2019   Procedure: CYSTOSCOPY;  Surgeon: Nadara Mustard, MD;  Location: [facility] ORS;  Service: Gynecology;  Laterality: N/A;   LAPAROSCOPIC BILATERAL SALPINGECTOMY  05/20/2019   Procedure: LAPAROSCOPIC BILATERAL SALPINGECTOMY;  Surgeon: Nadara Mustard, MD;  Location: [facility]S;  Service: Gynecology;;   LAPAROSCOPIC LYSIS OF ADHESIONS  05/20/2019   Procedure: LAPAROSCOPIC LYSIS OF ADHESIONS;  Surgeon: Nadara Mustard, MD;  Location: [facility];  Service: Gynecology;;   LAPAROSCOPY N/A 05/20/2019   Procedure: LAPAROSCOPY DIAGNOSTIC;  Surgeon: Nadara Mustard, MD;  Location: [facility]S;  Service: Gynecology;  Laterality: N/A;   THERAPEUTIC ABORTION     TONSILLECTOMY     TUBAL LIGATION  09/26/2016   [facility]   TYMPANOSTOMY TUBE PLACEMENT Bilateral    WISDOM TOOTH EXTRACTION      OB History     Gravida  4   Para  3   Term  3   Preterm      AB  1   Living  3      SAB      IAB  1  Ectopic      Multiple  0   Live Births  3            Home Medications    Prior to Admission medications   Medication Sig Start Date End Date Taking? Authorizing Provider  benzonatate (TESSALON) 200 MG capsule TAKE 1 CAPSULE BY MOUTH EVERY DAY 3 TIMES A DAY AS NEEDED FOR COUGH    [provider]  Cholecalciferol (VITAMIN D-1000 MAX ST) 25 MCG (1000 UT) tablet Take by mouth.    [provider]  metoCLOPramide (REGLAN) 10 MG tablet TAKE 1 TABLET BY MOUTH 8 HOURS AS NEEDED FOR NAUSEA    [provider]  Plecanatide (TRULANCE) 3 MG TABS Take 1 tablet by mouth daily. 11/09/20   Wyline Mood, MD  rizatriptan (MAXALT) 10 MG tablet  Take by mouth. 09/26/21   [provider]  Semaglutide,0.25 or 0.5MG /DOS, (OZEMPIC, 0.25 OR 0.5 MG/DOSE,) 2 MG/3ML SOPN INJECT 0.5MG  INTO SKIN ONCE WEEKLY. 06/05/22   Miki Kins, FNP  triamcinolone ointment (KENALOG) 0.1 % APPLY TO AFFECTED AREA TWICE A DAY    [provider]  venlafaxine XR (EFFEXOR-XR) 37.5 MG 24 hr capsule Take 37.5 mg by mouth daily. 09/01/20   [provider]  Vitamin D, Ergocalciferol, (DRISDOL) 1.25 MG (50000 UNIT) CAPS capsule Take 50,000 Units by mouth once a week. Patient not taking: Reported on 03/29/2022 08/26/21   [provider]    Family History Family History  Problem Relation Age of Onset   Anemia Mother    Anxiety disorder Mother    Kidney disease Father    Colon cancer Paternal Grandfather    Hypertension Paternal Grandmother    Diabetes Paternal Grandmother     Social History Social History   Tobacco Use   Smoking status: Never   Smokeless tobacco: Never  Vaping Use   Vaping Use: Never used  Substance Use Topics   Alcohol use: Yes    Alcohol/week: 7.0 standard drinks of alcohol    Types: 7 Glasses of wine per week    Comment: glass of wine daily   Drug use: Not Currently    Types: Marijuana    Comment: socially     Allergies   Sulfa antibiotics   Review of Systems Review of Systems  Neurological:  Positive for headaches.     Physical Exam Triage Vital Signs ED Triage Vitals  Enc Vitals Group     BP      Pulse      Resp      Temp      Temp src      SpO2      Weight      Height      Head Circumference      Peak Flow      Pain Score      Pain Loc      Pain Edu?      Excl. in GC?    No data found.  Updated Vital Signs There were no vitals taken for this visit.  Visual Acuity Right Eye Distance:   Left Eye Distance:   Bilateral Distance:    Right Eye Near:   Left Eye Near:    Bilateral Near:     Physical Exam Vitals reviewed.  Constitutional:      Appearance: She  is well-developed. She is ill-appearing.  Cardiovascular:     Rate and Rhythm: Normal rate and regular rhythm.  Pulmonary:     Effort:  Pulmonary effort is normal.     Breath sounds: Normal breath sounds.  Skin:    General: Skin is warm and dry.  Neurological:     Mental Status: She is alert and oriented to person, place, and time.  Psychiatric:        Mood and Affect: Mood normal.        Behavior: Behavior normal.      UC Treatments / Results  Labs (all labs ordered are listed, but only abnormal results are displayed) Labs Reviewed - No data to display  EKG   Radiology No results found.  Procedures Procedures (including critical care time)  Medications Ordered in UC Medications - No data to display  Initial Impression / Assessment and Plan / UC Course  I have reviewed the triage vital signs and the nursing notes.  Pertinent labs & imaging results that were available during my care of the patient were reviewed by me and considered in my medical decision making (see chart for details).   Riyana P Schader is a 32 y.o. female presenting with headache and tremors. Patient is afebrile without recent antipyretics, satting well on room air. Overall is ill appearing though non-toxic, well hydrated, without respiratory distress. Pulmonary exam is unremarkable.  Lungs CTAB without wheezing, rhonchi, rales. RRR.  POC BG is 105. Symptoms are consistent with acute migraine. Will administer headache cocktail of toradol 30 mg IM and decadron 10 mg IM to reduce risk of recurrence. Will discharge with prescription for PO promethazine.  Reviewed chart history.   Counseled patient on potential for adverse effects with medications prescribed/recommended today, ER and return-to-clinic precautions discussed, patient verbalized understanding and agreement with care plan.   Final Clinical Impressions(s) / UC Diagnoses   Final diagnoses:  None   Discharge Instructions   None    ED  Prescriptions   None    PDMP not reviewed this encounter.   Charma Igo, [place] 06/29/22 1308

## 2022-07-05 ENCOUNTER — Encounter: Payer: Self-pay | Admitting: Family

## 2022-07-05 ENCOUNTER — Ambulatory Visit (INDEPENDENT_AMBULATORY_CARE_PROVIDER_SITE_OTHER): Payer: BC Managed Care – PPO | Admitting: Family

## 2022-07-05 VITALS — BP 112/68 | HR 74 | Ht 64.0 in | Wt 175.4 lb

## 2022-07-05 DIAGNOSIS — E559 Vitamin D deficiency, unspecified: Secondary | ICD-10-CM

## 2022-07-05 DIAGNOSIS — E782 Mixed hyperlipidemia: Secondary | ICD-10-CM | POA: Diagnosis not present

## 2022-07-05 DIAGNOSIS — E538 Deficiency of other specified B group vitamins: Secondary | ICD-10-CM | POA: Diagnosis not present

## 2022-07-05 DIAGNOSIS — R7303 Prediabetes: Secondary | ICD-10-CM

## 2022-07-05 DIAGNOSIS — R5383 Other fatigue: Secondary | ICD-10-CM

## 2022-07-05 MED ORDER — SEMAGLUTIDE (1 MG/DOSE) 4 MG/3ML ~~LOC~~ SOPN: 1.0000 mg | PEN_INJECTOR | SUBCUTANEOUS | 2 refills | Status: DC

## 2022-07-05 NOTE — Progress Notes (Signed)
Established Patient Office Visit  Subjective:  Patient ID: Brenda Wang, female    DOB: [birth year]  Age: 32 y.o. MRN: [id number]  Chief Complaint  Patient presents with   Follow-up    Medication refills    Patient is here today for her 3 months follow up.  She has been feeling well since last appointment.   She does have additional concerns to discuss today.  She recently went to urgent care for a sudden headache that gave her a dizziness, nausea. She called here, and we asked her to go to urgent care given the suddenness of her headache.   Labs are due today. She needs refills.   I have reviewed her active problem list, medication list, allergies, notes from last encounter, lab results for her appointment today.      No other concerns at this time.   Past Medical History:  Diagnosis Date   Anemia    Anxiety    H/O: cesarean section    FTP and NRFHT   Headache    migraines    Migraine    PONV (postoperative nausea and vomiting)    nausea and vomiting after 2nd c-section   Seizures (HCC)    as a child, none recently.    Past Surgical History:  Procedure Laterality Date   CESAREAN SECTION  01/23/2014   CESAREAN SECTION N/A 08/10/2015   Procedure: CESAREAN SECTION;  Surgeon: Vena Austria, MD;  Location: [facility]S;  Service: Obstetrics;  Laterality: N/A;   CESAREAN SECTION WITH BILATERAL TUBAL LIGATION Bilateral 09/26/2016   Procedure: CESAREAN SECTION WITH BILATERAL TUBAL LIGATION;  Surgeon: Vena Austria, MD;  Location: [facility];  Service: Obstetrics;  Laterality: Bilateral;   COLONOSCOPY WITH PROPOFOL N/A 11/01/2020   Procedure: COLONOSCOPY WITH PROPOFOL;  Surgeon: Wyline Mood, MD;  Location: [facility] ENDOSCOPY;  Service: Gastroenterology;  Laterality: N/A;   COLONOSCOPY WITH PROPOFOL N/A 04/11/2021   Procedure: COLONOSCOPY WITH PROPOFOL;  Surgeon: Wyline Mood, MD;  Location: [facility] ENDOSCOPY;  Service: Gastroenterology;  Laterality: N/A;   CYSTOSCOPY N/A  05/20/2019   Procedure: CYSTOSCOPY;  Surgeon: Nadara Mustard, MD;  Location: [facility] ORS;  Service: Gynecology;  Laterality: N/A;   LAPAROSCOPIC BILATERAL SALPINGECTOMY  05/20/2019   Procedure: LAPAROSCOPIC BILATERAL SALPINGECTOMY;  Surgeon: Nadara Mustard, MD;  Location: [facility]S;  Service: Gynecology;;   LAPAROSCOPIC LYSIS OF ADHESIONS  05/20/2019   Procedure: LAPAROSCOPIC LYSIS OF ADHESIONS;  Surgeon: Nadara Mustard, MD;  Location: [facility] ORS;  Service: Gynecology;;   LAPAROSCOPY N/A 05/20/2019   Procedure: LAPAROSCOPY DIAGNOSTIC;  Surgeon: Nadara Mustard, MD;  Location: [facility] ORS;  Service: Gynecology;  Laterality: N/A;   THERAPEUTIC ABORTION     TONSILLECTOMY     TUBAL LIGATION  09/26/2016   [facility]   TYMPANOSTOMY TUBE PLACEMENT Bilateral    WISDOM TOOTH EXTRACTION      Social History   Socioeconomic History   Marital status: Married    Spouse name: Not on file   Number of children: 3   Years of education: college   Highest education level: Bachelor's degree (e.g., BA, AB, BS)  Occupational History   Occupation: Teller at [employer]  Tobacco Use   Smoking status: Never   Smokeless tobacco: Never  Vaping Use   Vaping Use: Never used  Substance and Sexual Activity   Alcohol use: Yes    Alcohol/week: 7.0 standard drinks of alcohol    Types: 7 Glasses of wine per week    Comment: glass of  wine daily   Drug use: Not Currently    Types: Marijuana    Comment: socially   Sexual activity: Yes    Birth control/protection: Surgical    Comment: BTL  Other Topics Concern   Not on file  Social History Narrative   Lives at home with her family.   Right-handed.   No daily use of caffeine.   Social Determinants of Health   Financial Resource Strain: Not on file  Food Insecurity: Not on file  Transportation Needs: Not on file  Physical Activity: Not on file  Stress: Not on file  Social Connections: Not on file  Intimate Partner Violence: Not on file    Family History   Problem Relation Age of Onset   Anemia Mother    Anxiety disorder Mother    Kidney disease Father    Colon cancer Paternal Grandfather    Hypertension Paternal Grandmother    Diabetes Paternal Grandmother     Allergies  Allergen Reactions   Sulfa Antibiotics Hives    Review of Systems  All other systems reviewed and are negative.      Objective:   BP 112/68   Pulse 74   Ht 5\' 4"  (1.626 m)   Wt 175 lb 6.4 oz (79.6 kg)   SpO2 95%   BMI 30.11 kg/m   Vitals:   07/05/22 1035  BP: 112/68  Pulse: 74  Height: 5\' 4"  (1.626 m)  Weight: 175 lb 6.4 oz (79.6 kg)  SpO2: 95%  BMI (Calculated): 30.09    Physical Exam Vitals and nursing note reviewed.  Constitutional:      Appearance: Normal appearance. She is normal weight.  HENT:     Head: Normocephalic.  Eyes:     Pupils: Pupils are equal, round, and reactive to light.  Cardiovascular:     Rate and Rhythm: Normal rate.  Pulmonary:     Effort: Pulmonary effort is normal.  Neurological:     Mental Status: She is alert.      No results found for any visits on 07/05/22.  Recent Results (from the past 2160 hour(s))  POCT CBG (manual entry)     Status: Abnormal   Collection Time: 06/29/22  1:04 PM  Result Value Ref Range   POCT Glucose ([facility]) 105 (A) 70 - 99 mg/dL       Assessment & Plan:   Problem List Items Addressed This Visit   None Visit Diagnoses     Vitamin D deficiency, unspecified    -  Primary   Relevant Orders   VITAMIN D 25 Hydroxy (Vit-D Deficiency, Fractures)   CBC With Differential   CMP14+EGFR   Prediabetes       Relevant Medications   Semaglutide, 1 MG/DOSE, 4 MG/3ML SOPN   Other Relevant Orders   CBC With Differential   CMP14+EGFR   Hemoglobin A1c   Mixed hyperlipidemia       Relevant Orders   Lipid panel   CBC With Differential   CMP14+EGFR   B12 deficiency due to diet       Relevant Orders   CBC With Differential   CMP14+EGFR   Vitamin B12   Other fatigue       Relevant  Orders   CBC With Differential   CMP14+EGFR   TSH       Return in about 3 months (around 10/05/2022) for F/U.   Total time spent: 20 minutes  Miki Kins, FNP  07/05/2022   This document may  have been prepared by [employer] and as such may include unintentional dictation errors.

## 2022-07-05 NOTE — Telephone Encounter (Signed)
Discussed at appointment.

## 2022-07-06 LAB — LIPID PANEL
Chol/HDL Ratio: 2.1 ratio (ref 0.0–4.4)
Cholesterol, Total: 105 mg/dL (ref 100–199)
HDL: 50 mg/dL (ref >39)
LDL Chol Calc (NIH): 43 mg/dL (ref 0–99)
Triglycerides: 52 mg/dL (ref 0–149)
VLDL Cholesterol Cal: 12 mg/dL (ref 5–40)

## 2022-07-06 LAB — CBC WITH DIFFERENTIAL
Basophils Absolute: 0.1 10*3/uL (ref 0.0–0.2)
Basos: 1 %
EOS (ABSOLUTE): 0.1 10*3/uL (ref 0.0–0.4)
Eos: 1 %
Hematocrit: 35.6 % (ref 34.0–46.6)
Hemoglobin: 11.6 g/dL (ref 11.1–15.9)
Immature Grans (Abs): 0 10*3/uL (ref 0.0–0.1)
Immature Granulocytes: 0 %
Lymphocytes Absolute: 2 10*3/uL (ref 0.7–3.1)
Lymphs: 33 %
MCH: 29.1 pg (ref 26.6–33.0)
MCHC: 32.6 g/dL (ref 31.5–35.7)
MCV: 89 fL (ref 79–97)
Monocytes Absolute: 0.3 10*3/uL (ref 0.1–0.9)
Monocytes: 5 %
Neutrophils Absolute: 3.8 10*3/uL (ref 1.4–7.0)
Neutrophils: 60 %
RBC: 3.99 x10E6/uL (ref 3.77–5.28)
RDW: 13.3 % (ref 11.7–15.4)
WBC: 6.2 10*3/uL (ref 3.4–10.8)

## 2022-07-06 LAB — CMP14+EGFR
ALT: 14 IU/L (ref 0–32)
AST: 10 IU/L (ref 0–40)
Albumin/Globulin Ratio: 1.6 (ref 1.2–2.2)
Albumin: 3.8 g/dL — ABNORMAL LOW (ref 3.9–4.9)
Alkaline Phosphatase: 64 IU/L (ref 44–121)
BUN/Creatinine Ratio: 6 — ABNORMAL LOW (ref 9–23)
BUN: 5 mg/dL — ABNORMAL LOW (ref 6–20)
Bilirubin Total: 0.5 mg/dL (ref 0.0–1.2)
CO2: 23 mmol/L (ref 20–29)
Calcium: 8.7 mg/dL (ref 8.7–10.2)
Chloride: 102 mmol/L (ref 96–106)
Creatinine, Ser: 0.77 mg/dL (ref 0.57–1.00)
Globulin, Total: 2.4 g/dL (ref 1.5–4.5)
Glucose: 73 mg/dL (ref 70–99)
Potassium: 4.1 mmol/L (ref 3.5–5.2)
Sodium: 137 mmol/L (ref 134–144)
Total Protein: 6.2 g/dL (ref 6.0–8.5)
eGFR: 105 mL/min/{1.73_m2} (ref >59)

## 2022-07-06 LAB — TSH: TSH: 1.29 u[IU]/mL (ref 0.450–4.500)

## 2022-07-06 LAB — HEMOGLOBIN A1C
Est. average glucose Bld gHb Est-mCnc: 114 mg/dL
Hgb A1c MFr Bld: 5.6 % (ref 4.8–5.6)

## 2022-07-06 LAB — VITAMIN D 25 HYDROXY (VIT D DEFICIENCY, FRACTURES): Vit D, 25-Hydroxy: 13.8 ng/mL — ABNORMAL LOW (ref 30.0–100.0)

## 2022-07-06 LAB — VITAMIN B12: Vitamin B-12: 237 pg/mL (ref 232–1245)

## 2022-07-09 ENCOUNTER — Encounter: Payer: Self-pay | Admitting: Family

## 2022-08-31 ENCOUNTER — Other Ambulatory Visit: Payer: Self-pay | Admitting: Family

## 2022-08-31 DIAGNOSIS — R7303 Prediabetes: Secondary | ICD-10-CM

## 2022-09-18 ENCOUNTER — Encounter: Payer: Self-pay | Admitting: Family

## 2022-12-19 ENCOUNTER — Encounter: Payer: Self-pay | Admitting: Family

## 2023-01-16 ENCOUNTER — Ambulatory Visit
Admission: EM | Admit: 2023-01-16 | Discharge: 2023-01-16 | Disposition: A | Discharge status: Home / Self Care | Payer: BC Managed Care – PPO | Attending: Emergency Medicine | Admitting: Emergency Medicine

## 2023-01-16 DIAGNOSIS — J069 Acute upper respiratory infection, unspecified: Secondary | ICD-10-CM | POA: Diagnosis not present

## 2023-01-16 LAB — POCT RAPID STREP A (OFFICE): Rapid Strep A Screen: NEGATIVE

## 2023-01-16 MED ORDER — BENZONATATE 200 MG PO CAPS: 200.0000 mg | ORAL_CAPSULE | Freq: Three times a day (TID) | ORAL | 0 refills | Status: DC | PRN

## 2023-01-16 NOTE — ED Triage Notes (Signed)
Patient to Urgent Care with complaints of cough/ fevers/ sore throat/ body aches/ nausea.  Symptoms started Saturday. Unknown max temp.  Has been taking tylenol/ otc cough meds.

## 2023-01-16 NOTE — ED Provider Notes (Signed)
Renaldo Fiddler    CSN: [id number] Arrival date & time: 01/16/23  1734      History   Chief Complaint Chief Complaint  Patient presents with   Generalized Body Aches   Cough    HPI Brenda Wang is a 32 y.o. female.   Patient presents for evaluation of nasal congestion, rhinorrhea, nonproductive cough, subjective fever, chills, body aches, sore throat and nausea without vomiting present for 3 days.  Known sick contact in household with exposure to ear infection.  Has attempted use of NyQuil and Tylenol which have been minimally effective.  Denies shortness of breath or wheezing.  Past Medical History:  Diagnosis Date   Anemia    Anxiety    H/O: cesarean section    FTP and NRFHT   Headache    migraines    Migraine    PONV (postoperative nausea and vomiting)    nausea and vomiting after 2nd c-section   Seizures (HCC)    as a child, none recently.    Patient Active Problem List   Diagnosis Date Noted   Chronic migraine w/o aura w/o status migrainosus, not intractable 01/27/2020   Pelvic pain 04/15/2019   Dyspareunia due to medical condition in female 04/15/2019   History of cesarean section 08/10/2015   Irregular menses 01/28/2013    Past Surgical History:  Procedure Laterality Date   CESAREAN SECTION  01/23/2014   CESAREAN SECTION N/A 08/10/2015   Procedure: CESAREAN SECTION;  Surgeon: Vena Austria, MD;  Location: [facility] ORS;  Service: Obstetrics;  Laterality: N/A;   CESAREAN SECTION WITH BILATERAL TUBAL LIGATION Bilateral 09/26/2016   Procedure: CESAREAN SECTION WITH BILATERAL TUBAL LIGATION;  Surgeon: Vena Austria, MD;  Location: [facility] ORS;  Service: Obstetrics;  Laterality: Bilateral;   COLONOSCOPY WITH PROPOFOL N/A 11/01/2020   Procedure: COLONOSCOPY WITH PROPOFOL;  Surgeon: Wyline Mood, MD;  Location: [facility] ENDOSCOPY;  Service: Gastroenterology;  Laterality: N/A;   COLONOSCOPY WITH PROPOFOL N/A 04/11/2021   Procedure: COLONOSCOPY WITH PROPOFOL;   Surgeon: Wyline Mood, MD;  Location: [facility] ENDOSCOPY;  Service: Gastroenterology;  Laterality: N/A;   CYSTOSCOPY N/A 05/20/2019   Procedure: CYSTOSCOPY;  Surgeon: Nadara Mustard, MD;  Location: [facility] ORS;  Service: Gynecology;  Laterality: N/A;   LAPAROSCOPIC BILATERAL SALPINGECTOMY  05/20/2019   Procedure: LAPAROSCOPIC BILATERAL SALPINGECTOMY;  Surgeon: Nadara Mustard, MD;  Location: [facility] ORS;  Service: Gynecology;;   LAPAROSCOPIC LYSIS OF ADHESIONS  05/20/2019   Procedure: LAPAROSCOPIC LYSIS OF ADHESIONS;  Surgeon: Nadara Mustard, MD;  Location: [facility] ORS;  Service: Gynecology;;   LAPAROSCOPY N/A 05/20/2019   Procedure: LAPAROSCOPY DIAGNOSTIC;  Surgeon: Nadara Mustard, MD;  Location: [facility] ORS;  Service: Gynecology;  Laterality: N/A;   THERAPEUTIC ABORTION     TONSILLECTOMY     TUBAL LIGATION  09/26/2016   [facility]   TYMPANOSTOMY TUBE PLACEMENT Bilateral    WISDOM TOOTH EXTRACTION      OB History     Gravida  4   Para  3   Term  3   Preterm      AB  1   Living  3      SAB      IAB  1   Ectopic      Multiple  0   Live Births  3            Home Medications    Prior to Admission medications   Medication Sig Start Date End Date Taking? Authorizing Provider  benzonatate (  TESSALON) 200 MG capsule Take 1 capsule (200 mg total) by mouth 3 (three) times daily as needed for cough. 01/16/23   Sanjith Siwek, Elita Boone, NP  Cholecalciferol (VITAMIN D-1000 MAX ST) 25 MCG (1000 UT) tablet Take by mouth.    [provider]  metoCLOPramide (REGLAN) 10 MG tablet TAKE 1 TABLET BY MOUTH 8 HOURS AS NEEDED FOR NAUSEA    [provider]  Plecanatide (TRULANCE) 3 MG TABS Take 1 tablet by mouth daily. 11/09/20   Wyline Mood, MD  promethazine (PHENERGAN) 25 MG tablet Take 1 tablet (25 mg total) by mouth every 6 (six) hours as needed for nausea or vomiting. Patient not taking: Reported on 01/16/2023 06/29/22   Immordino, Jeannett Senior, FNP  rizatriptan (MAXALT) 10 MG tablet Take by  mouth. 09/26/21   [provider]  Semaglutide, 1 MG/DOSE, (OZEMPIC, 1 MG/DOSE,) 4 MG/3ML SOPN INJECT 1MG  INTO THE SKIN ONCE A WEEK 08/31/22   Miki Kins, FNP  triamcinolone ointment (KENALOG) 0.1 % APPLY TO AFFECTED AREA TWICE A DAY Patient not taking: Reported on 07/05/2022    [provider]  venlafaxine XR (EFFEXOR-XR) 37.5 MG 24 hr capsule Take 37.5 mg by mouth daily. 09/01/20   [provider]  Vitamin D, Ergocalciferol, (DRISDOL) 1.25 MG (50000 UNIT) CAPS capsule Take 50,000 Units by mouth once a week. 08/26/21   [provider]    Family History Family History  Problem Relation Age of Onset   Anemia Mother    Anxiety disorder Mother    Kidney disease Father    Colon cancer Paternal Grandfather    Hypertension Paternal Grandmother    Diabetes Paternal Grandmother     Social History Social History   Tobacco Use   Smoking status: Never   Smokeless tobacco: Never  Vaping Use   Vaping status: Never Used  Substance Use Topics   Alcohol use: Yes    Alcohol/week: 7.0 standard drinks of alcohol    Types: 7 Glasses of wine per week    Comment: glass of wine daily   Drug use: Not Currently    Types: Marijuana    Comment: socially     Allergies   Sulfa antibiotics   Review of Systems Review of Systems   Physical Exam Triage Vital Signs ED Triage Vitals  Encounter Vitals Group     BP 01/16/23 1754 126/80     Systolic BP Percentile --      Diastolic BP Percentile --      Pulse Rate 01/16/23 1754 90     Resp 01/16/23 1754 16     Temp 01/16/23 1754 99 F (37.2 C)     Temp Source 01/16/23 1754 Oral     SpO2 01/16/23 1754 100 %     Weight --      Height --      Head Circumference --      Peak Flow --      Pain Score 01/16/23 1742 7     Pain Loc --      Pain Education --      Exclude from Growth Chart --    No data found.  Updated Vital Signs BP 126/80 (BP Location: Left Arm)   Pulse 90   Temp 99 F (37.2 C) (Oral)    Resp 16   LMP 01/09/2023   SpO2 100%   Visual Acuity Right Eye Distance:   Left Eye Distance:   Bilateral Distance:    Right Eye Near:   Left  Eye Near:    Bilateral Near:     Physical Exam Constitutional:      Appearance: Normal appearance.  HENT:     Right Ear: Tympanic membrane, ear canal and external ear normal.     Left Ear: Tympanic membrane, ear canal and external ear normal.     Nose: Congestion present. No rhinorrhea.     Mouth/Throat:     Mouth: Mucous membranes are moist.     Pharynx: Oropharynx is clear. No oropharyngeal exudate or posterior oropharyngeal erythema.  Eyes:     Extraocular Movements: Extraocular movements intact.  Cardiovascular:     Rate and Rhythm: Normal rate and regular rhythm.     Pulses: Normal pulses.     Heart sounds: Normal heart sounds.  Pulmonary:     Effort: Pulmonary effort is normal.     Breath sounds: Normal breath sounds.     Comments: Harsh persistent cough witnessed Neurological:     Mental Status: She is alert and oriented to person, place, and time. Mental status is at baseline.      UC Treatments / Results  Labs (all labs ordered are listed, but only abnormal results are displayed) Labs Reviewed  POCT RAPID STREP A (OFFICE)    EKG   Radiology No results found.  Procedures Procedures (including critical care time)  Medications Ordered in UC Medications - No data to display  Initial Impression / Assessment and Plan / UC Course  I have reviewed the triage vital signs and the nursing notes.  Pertinent labs & imaging results that were available during my care of the patient were reviewed by me and considered in my medical decision making (see chart for details).  \Viral URI with cough  Patient is in no signs of distress nor toxic appearing.  Vital signs are stable.  Low suspicion for pneumonia, pneumothorax or bronchitis and therefore will defer imaging.  Rapid strep test negative.  Prescribed Tessalon.May  use additional over-the-counter medications as needed for supportive care.  May follow-up with urgent care as needed if symptoms persist or worsen.    Final Clinical Impressions(s) / UC Diagnoses   Final diagnoses:  Viral URI with cough     Discharge Instructions      Your symptoms today are most likely being caused by a virus and should steadily improve in time it can take up to 7 to 10 days before you truly start to see a turnaround however things will get better  Rapid strep test negative   Use Tessalon pill every 8 hours for cough in addition to over-the-counter medications    You can take Tylenol and/or Ibuprofen as needed for fever reduction and pain relief.   For cough: honey 1/2 to 1 teaspoon (you can dilute the honey in water or another fluid).  You can also use guaifenesin and dextromethorphan for cough. You can use a humidifier for chest congestion and cough.  If you don't have a humidifier, you can sit in the bathroom with the hot shower running.      For sore throat: try warm salt water gargles, cepacol lozenges, throat spray, warm tea or water with lemon/honey, popsicles or ice, or OTC cold relief medicine for throat discomfort.   For congestion: take a daily anti-histamine like Zyrtec, Claritin, and a oral decongestant, such as pseudoephedrine.  You can also use Flonase 1-2 sprays in each nostril daily.   It is important to stay hydrated: drink plenty of fluids (water, gatorade/powerade/pedialyte, juices, or teas)  to keep your throat moisturized and help further relieve irritation/discomfort.    ED Prescriptions     Medication Sig Dispense Auth. Provider   benzonatate (TESSALON) 200 MG capsule Take 1 capsule (200 mg total) by mouth 3 (three) times daily as needed for cough. 20 capsule Valinda Hoar, NP      PDMP not reviewed this encounter.   Valinda Hoar, NP 01/16/23 1900

## 2023-01-16 NOTE — Discharge Instructions (Addendum)
Your symptoms today are most likely being caused by a virus and should steadily improve in time it can take up to 7 to 10 days before you truly start to see a turnaround however things will get better  Rapid strep test negative   Use Tessalon pill every 8 hours for cough in addition to over-the-counter medications    You can take Tylenol and/or Ibuprofen as needed for fever reduction and pain relief.   For cough: honey 1/2 to 1 teaspoon (you can dilute the honey in water or another fluid).  You can also use guaifenesin and dextromethorphan for cough. You can use a humidifier for chest congestion and cough.  If you don't have a humidifier, you can sit in the bathroom with the hot shower running.      For sore throat: try warm salt water gargles, cepacol lozenges, throat spray, warm tea or water with lemon/honey, popsicles or ice, or OTC cold relief medicine for throat discomfort.   For congestion: take a daily anti-histamine like Zyrtec, Claritin, and a oral decongestant, such as pseudoephedrine.  You can also use Flonase 1-2 sprays in each nostril daily.   It is important to stay hydrated: drink plenty of fluids (water, gatorade/powerade/pedialyte, juices, or teas) to keep your throat moisturized and help further relieve irritation/discomfort.

## 2023-02-21 ENCOUNTER — Emergency Department
Admission: EM | Admit: 2023-02-21 | Discharge: 2023-02-21 | Disposition: A | Discharge status: Home / Self Care | Payer: BC Managed Care – PPO | Attending: Emergency Medicine | Admitting: Emergency Medicine

## 2023-02-21 ENCOUNTER — Encounter: Payer: Self-pay | Admitting: Medical Oncology

## 2023-02-21 ENCOUNTER — Other Ambulatory Visit: Payer: Self-pay

## 2023-02-21 DIAGNOSIS — K529 Noninfective gastroenteritis and colitis, unspecified: Secondary | ICD-10-CM | POA: Diagnosis not present

## 2023-02-21 DIAGNOSIS — R55 Syncope and collapse: Secondary | ICD-10-CM | POA: Insufficient documentation

## 2023-02-21 DIAGNOSIS — R197 Diarrhea, unspecified: Secondary | ICD-10-CM | POA: Diagnosis present

## 2023-02-21 LAB — COMPREHENSIVE METABOLIC PANEL
ALT: 20 U/L (ref 0–44)
AST: 18 U/L (ref 15–41)
Albumin: 3.4 g/dL — ABNORMAL LOW (ref 3.5–5.0)
Alkaline Phosphatase: 49 U/L (ref 38–126)
Anion gap: 9 (ref 5–15)
BUN: 11 mg/dL (ref 6–20)
CO2: 22 mmol/L (ref 22–32)
Calcium: 8.3 mg/dL — ABNORMAL LOW (ref 8.9–10.3)
Chloride: 105 mmol/L (ref 98–111)
Creatinine, Ser: 0.86 mg/dL (ref 0.44–1.00)
GFR, Estimated: >60 mL/min (ref >60)
Glucose, Bld: 107 mg/dL — ABNORMAL HIGH (ref 70–99)
Potassium: 3.8 mmol/L (ref 3.5–5.1)
Sodium: 136 mmol/L (ref 135–145)
Total Bilirubin: 1 mg/dL (ref 0.0–1.2)
Total Protein: 6.7 g/dL (ref 6.5–8.1)

## 2023-02-21 LAB — CBC
HCT: 40.1 % (ref 36.0–46.0)
Hemoglobin: 13.3 g/dL (ref 12.0–15.0)
MCH: 29.5 pg (ref 26.0–34.0)
MCHC: 33.2 g/dL (ref 30.0–36.0)
MCV: 88.9 fL (ref 80.0–100.0)
Platelets: 203 10*3/uL (ref 150–400)
RBC: 4.51 MIL/uL (ref 3.87–5.11)
RDW: 13.5 % (ref 11.5–15.5)
WBC: 5.2 10*3/uL (ref 4.0–10.5)
nRBC: 0 % (ref 0.0–0.2)

## 2023-02-21 LAB — URINALYSIS, ROUTINE W REFLEX MICROSCOPIC
Bacteria, UA: NONE SEEN
Bilirubin Urine: NEGATIVE
Glucose, UA: NEGATIVE mg/dL
Ketones, ur: 20 mg/dL — AB
Leukocytes,Ua: NEGATIVE
Nitrite: NEGATIVE
Protein, ur: 100 mg/dL — AB
Specific Gravity, Urine: 1.034 — ABNORMAL HIGH (ref 1.005–1.030)
pH: 5 (ref 5.0–8.0)

## 2023-02-21 LAB — LIPASE, BLOOD: Lipase: 25 U/L (ref 11–51)

## 2023-02-21 LAB — PREGNANCY, URINE: Preg Test, Ur: NEGATIVE

## 2023-02-21 LAB — POC URINE PREG, ED: Preg Test, Ur: NEGATIVE

## 2023-02-21 MED ORDER — LOPERAMIDE HCL 2 MG PO CAPS
2.0000 mg | ORAL_CAPSULE | Freq: Once | ORAL | Status: AC
  Administered 2023-02-21: 2 mg via ORAL
  Filled 2023-02-21: qty 1

## 2023-02-21 MED ORDER — ONDANSETRON 4 MG PO TBDP: 4.0000 mg | ORAL_TABLET | Freq: Three times a day (TID) | ORAL | 0 refills | Status: DC | PRN

## 2023-02-21 MED ORDER — LACTATED RINGERS IV BOLUS
1000.0000 mL | Freq: Once | INTRAVENOUS | Status: AC
Start: 2023-02-21 — End: 2023-02-21
  Administered 2023-02-21: 1000 mL via INTRAVENOUS

## 2023-02-21 MED ORDER — ONDANSETRON HCL 4 MG/2ML IJ SOLN
4.0000 mg | Freq: Once | INTRAMUSCULAR | Status: AC
  Administered 2023-02-21: 4 mg via INTRAVENOUS
  Filled 2023-02-21: qty 2

## 2023-02-21 NOTE — ED Triage Notes (Signed)
 Pt reports that she began yesterday having diarrhea, vomiting and states that she has had 2 syncopal episodes since yesterday. Pt NAD at this time. Ambulatory without difficulty.

## 2023-02-21 NOTE — ED Notes (Signed)
 See triage notes. Patient c/o N/V/D since Monday. Patient stated that she hasn't really been able to eat but has been drinking Gatorade and water. Patient also stated she has blacked out twice since Monday with one instance being this morning.

## 2023-02-21 NOTE — ED Provider Notes (Signed)
 [facility] Provider Note    Event Date/Time   First MD Initiated Contact with Patient 02/21/23 [phone]     (approximate)   History   Chief Complaint Diarrhea and Emesis   HPI  Brenda Wang is a 33 y.o. female with past medical history of migraines and anemia who presents to the ED complaining of vomiting and diarrhea.  Patient reports that she has had about 2 days of persistent nausea, vomiting, and diarrhea.  She states that she has been unable to keep down solids during this time, has been able to keep down liquids intermittently but feels like this goes right through her.  She has had crampy pain in her abdomen, but denies significant abdominal pain currently.  She does state that she has been feeling lightheaded and dizzy, ended up having a syncopal episode yesterday when walking her dog.  She states that she fell forward onto her hands, denies hitting her head.  She has not had any chest pain or shortness of breath, denies any fevers, dysuria, or flank pain.  She does report history of similar syncopal episodes in the past, has been diagnosed with vasovagal syncope.     Physical Exam   Triage Vital Signs: ED Triage Vitals [02/21/23 0822]  Encounter Vitals Group     BP 115/64     Systolic BP Percentile      Diastolic BP Percentile      Pulse Rate 99     Resp 16     Temp 97.7 F (36.5 C)     Temp Source Oral     SpO2 100 %     Weight 160 lb (72.6 kg)     Height 5' 4 (1.626 m)     Head Circumference      Peak Flow      Pain Score 6     Pain Loc      Pain Education      Exclude from Growth Chart     Most recent vital signs: Vitals:   02/21/23 0822  BP: 115/64  Pulse: 99  Resp: 16  Temp: 97.7 F (36.5 C)  SpO2: 100%    Constitutional: Alert and oriented. Eyes: Conjunctivae are normal. Head: Atraumatic. Nose: No congestion/rhinnorhea. Mouth/Throat: Mucous membranes are moist.  Cardiovascular: Normal rate, regular rhythm.  Grossly normal heart sounds.  2+ radial pulses bilaterally. Respiratory: Normal respiratory effort.  No retractions. Lungs CTAB. Gastrointestinal: Soft and nontender. No distention. Musculoskeletal: No lower extremity tenderness nor edema.  Neurologic:  Normal speech and language. No gross focal neurologic deficits are appreciated.    ED Results / Procedures / Treatments   Labs (all labs ordered are listed, but only abnormal results are displayed) Labs Reviewed  COMPREHENSIVE METABOLIC PANEL - Abnormal; Notable for the following components:      Result Value   Glucose, Bld 107 (*)    Calcium 8.3 (*)    Albumin 3.4 (*)    All other components within normal limits  URINALYSIS, ROUTINE W REFLEX MICROSCOPIC - Abnormal; Notable for the following components:   Color, Urine AMBER (*)    APPearance HAZY (*)    Specific Gravity, Urine 1.034 (*)    Hgb urine dipstick SMALL (*)    Ketones, ur 20 (*)    Protein, ur 100 (*)    All other components within normal limits  LIPASE, BLOOD  CBC  PREGNANCY, URINE  POC URINE PREG, ED     EKG  ED  ECG REPORT I, Carlin Palin, the attending physician, personally viewed and interpreted this ECG.   Date: 02/21/2023  EKG Time: 8:24  Rate: 97  Rhythm: normal sinus rhythm  Axis: Normal  Intervals: Incomplete RBBB  ST&T Change: None  PROCEDURES:  Critical Care performed: No  Procedures   MEDICATIONS ORDERED IN ED: Medications  lactated ringers  bolus 1,000 mL (1,000 mLs Intravenous New Bag/Given 02/21/23 0941)  ondansetron  (ZOFRAN ) injection 4 mg (4 mg Intravenous Given 02/21/23 0946)  loperamide  (IMODIUM ) capsule 2 mg (2 mg Oral Given 02/21/23 0946)     IMPRESSION / MDM / ASSESSMENT AND PLAN / ED COURSE  I reviewed the triage vital signs and the nursing notes.                              33 y.o. female with past medical history of migraines and anemia who presents to the ED with 2 days of nausea, vomiting, and diarrhea now with a  syncopal episode yesterday and today.  Patient's presentation is most consistent with acute presentation with potential threat to life or bodily function.  Differential diagnosis includes, but is not limited to, gastroenteritis, dehydration, electrolyte abnormality, AKI, vasovagal syncope, orthostatic hypotension, anemia, arrhythmia.  Patient nontoxic-appearing and in no acute distress, vital signs are unremarkable.  EKG shows no evidence of arrhythmia or ischemia, doubt cardiac etiology for syncope, which sounds more consistent with vasovagal episodes versus dehydration.  Labs are reassuring with no significant anemia, leukocytosis, electrolyte abnormality, or AKI.  We will hydrate with IV fluids and treat with Zofran  and loperamide .  Patient reports feeling better on reassessment, pregnancy testing is negative and urinalysis shows no signs of infection.  LFTs and lipase are unremarkable and patient tolerating oral intake without difficulty.  She is appropriate for discharge home with outpatient follow-up, was counseled to return to the ED for new or worsening symptoms.  Patient agrees with plan.      FINAL CLINICAL IMPRESSION(S) / ED DIAGNOSES   Final diagnoses:  Gastroenteritis  Syncope, unspecified syncope type     Rx / DC Orders   ED Discharge Orders          Ordered    ondansetron  (ZOFRAN -ODT) 4 MG disintegrating tablet  Every 8 hours PRN        02/21/23 1100             Note:  This document was prepared using Dragon voice recognition software and may include unintentional dictation errors.   Palin Carlin, MD 02/21/23 1102

## 2023-03-27 ENCOUNTER — Ambulatory Visit (INDEPENDENT_AMBULATORY_CARE_PROVIDER_SITE_OTHER): Payer: BC Managed Care – PPO | Admitting: Family

## 2023-03-27 ENCOUNTER — Encounter: Payer: Self-pay | Admitting: Family

## 2023-03-27 VITALS — BP 110/74 | HR 68 | Ht 64.0 in | Wt 171.2 lb

## 2023-03-27 DIAGNOSIS — E559 Vitamin D deficiency, unspecified: Secondary | ICD-10-CM

## 2023-03-27 DIAGNOSIS — R7303 Prediabetes: Secondary | ICD-10-CM

## 2023-03-27 DIAGNOSIS — Z1159 Encounter for screening for other viral diseases: Secondary | ICD-10-CM

## 2023-03-27 DIAGNOSIS — I10 Essential (primary) hypertension: Secondary | ICD-10-CM | POA: Diagnosis not present

## 2023-03-27 DIAGNOSIS — E782 Mixed hyperlipidemia: Secondary | ICD-10-CM

## 2023-03-27 DIAGNOSIS — E538 Deficiency of other specified B group vitamins: Secondary | ICD-10-CM

## 2023-03-27 DIAGNOSIS — R5383 Other fatigue: Secondary | ICD-10-CM

## 2023-03-27 NOTE — Progress Notes (Signed)
Established Patient Office Visit  Subjective:  Patient ID: Brenda Wang, female    DOB: [birth year]  Age: 33 y.o. MRN: [id number]  Chief Complaint  Patient presents with   Follow-up    Patient is here today for her follow up.  She has been feeling fairly well since last appointment.   She does have additional concerns to discuss today.  She had an episode where she had to go to the hospital because she got dehydrated and passed out.  They gave her some fluids and some nausea meds and sent her home. She made the appointment today because she does need labs.  She also says that her insurance company is no longer covering the Ozempic for her so she asks if we have any other options.  Labs are due today. She needs refills.   I have reviewed her active problem list, medication list, allergies, health maintenance, notes from last encounter, lab results for her appointment today.      No other concerns at this time.   Past Medical History:  Diagnosis Date   Anemia    Anxiety    H/O: cesarean section    FTP and NRFHT   Headache    migraines    Migraine    PONV (postoperative nausea and vomiting)    nausea and vomiting after 2nd c-section   Seizures (HCC)    as a child, none recently.    Past Surgical History:  Procedure Laterality Date   CESAREAN SECTION  01/23/2014   CESAREAN SECTION N/A 08/10/2015   Procedure: CESAREAN SECTION;  Surgeon: Vena Austria, MD;  Location: [facility] ORS;  Service: Obstetrics;  Laterality: N/A;   CESAREAN SECTION WITH BILATERAL TUBAL LIGATION Bilateral 09/26/2016   Procedure: CESAREAN SECTION WITH BILATERAL TUBAL LIGATION;  Surgeon: Vena Austria, MD;  Location: [facility] ORS;  Service: Obstetrics;  Laterality: Bilateral;   COLONOSCOPY WITH PROPOFOL N/A 11/01/2020   Procedure: COLONOSCOPY WITH PROPOFOL;  Surgeon: Wyline Mood, MD;  Location: [facility] ENDOSCOPY;  Service: Gastroenterology;  Laterality: N/A;   COLONOSCOPY WITH PROPOFOL N/A 04/11/2021    Procedure: COLONOSCOPY WITH PROPOFOL;  Surgeon: Wyline Mood, MD;  Location: [facility] ENDOSCOPY;  Service: Gastroenterology;  Laterality: N/A;   CYSTOSCOPY N/A 05/20/2019   Procedure: CYSTOSCOPY;  Surgeon: Nadara Mustard, MD;  Location: [facility] ORS;  Service: Gynecology;  Laterality: N/A;   LAPAROSCOPIC BILATERAL SALPINGECTOMY  05/20/2019   Procedure: LAPAROSCOPIC BILATERAL SALPINGECTOMY;  Surgeon: Nadara Mustard, MD;  Location: [facility] ORS;  Service: Gynecology;;   LAPAROSCOPIC LYSIS OF ADHESIONS  05/20/2019   Procedure: LAPAROSCOPIC LYSIS OF ADHESIONS;  Surgeon: Nadara Mustard, MD;  Location: [facility] ORS;  Service: Gynecology;;   LAPAROSCOPY N/A 05/20/2019   Procedure: LAPAROSCOPY DIAGNOSTIC;  Surgeon: Nadara Mustard, MD;  Location: [facility] ORS;  Service: Gynecology;  Laterality: N/A;   THERAPEUTIC ABORTION     TONSILLECTOMY     TUBAL LIGATION  09/26/2016   [facility]   TYMPANOSTOMY TUBE PLACEMENT Bilateral    WISDOM TOOTH EXTRACTION      Social History   Socioeconomic History   Marital status: Married    Spouse name: Not on file   Number of children: 3   Years of education: college   Highest education level: Bachelor's degree (e.g., BA, AB, BS)  Occupational History   Occupation: Teller at [employer]  Tobacco Use   Smoking status: Never   Smokeless tobacco: Never  Vaping Use   Vaping status: Never Used  Substance and Sexual Activity  Alcohol use: Yes    Alcohol/week: 7.0 standard drinks of alcohol    Types: 7 Glasses of wine per week    Comment: glass of wine daily   Drug use: Not Currently    Types: Marijuana    Comment: socially   Sexual activity: Yes    Birth control/protection: Surgical    Comment: BTL  Other Topics Concern   Not on file  Social History Narrative   Lives at home with her family.   Right-handed.   No daily use of caffeine.   Social Drivers of Corporate investment banker Strain: Not on file  Food Insecurity: Not on file  Transportation Needs: Not on file   Physical Activity: Not on file  Stress: Not on file  Social Connections: Not on file  Intimate Partner Violence: Not on file    Family History  Problem Relation Age of Onset   Anemia Mother    Anxiety disorder Mother    Kidney disease Father    Colon cancer Paternal Grandfather    Hypertension Paternal Grandmother    Diabetes Paternal Grandmother     Allergies  Allergen Reactions   Sulfa Antibiotics Hives    Review of Systems  All other systems reviewed and are negative.      Objective:   BP 110/74   Pulse 68   Ht 5\' 4"  (1.626 m)   Wt 171 lb 3.2 oz (77.7 kg)   SpO2 99%   BMI 29.39 kg/m   Vitals:   03/27/23 0943  BP: 110/74  Pulse: 68  Height: 5\' 4"  (1.626 m)  Weight: 171 lb 3.2 oz (77.7 kg)  SpO2: 99%  BMI (Calculated): 29.37    Physical Exam Vitals and nursing note reviewed.  Constitutional:      Appearance: Normal appearance. She is normal weight.  HENT:     Head: Normocephalic.  Eyes:     Extraocular Movements: Extraocular movements intact.     Conjunctiva/sclera: Conjunctivae normal.     Pupils: Pupils are equal, round, and reactive to light.  Cardiovascular:     Rate and Rhythm: Normal rate.  Pulmonary:     Effort: Pulmonary effort is normal.  Neurological:     General: No focal deficit present.     Mental Status: She is alert and oriented to person, place, and time. Mental status is at baseline.  Psychiatric:        Mood and Affect: Mood normal.        Behavior: Behavior normal.        Thought Content: Thought content normal.        Judgment: Judgment normal.      No results found for any visits on 03/27/23.  Recent Results (from the past 2160 hours)  POCT rapid strep A     Status: None   Collection Time: 01/16/23  6:12 PM  Result Value Ref Range   Rapid Strep A Screen Negative Negative  Lipase, blood     Status: None   Collection Time: 02/21/23  8:24 AM  Result Value Ref Range   Lipase 25 11 - 51 U/L    Comment: Performed at  [facility], [address] [place], [place] [address]  Comprehensive metabolic panel     Status: Abnormal   Collection Time: 02/21/23  8:24 AM  Result Value Ref Range   Sodium 136 135 - 145 mmol/L   Potassium 3.8 3.5 - 5.1 mmol/L   Chloride 105 98 - 111 mmol/L  CO2 22 22 - 32 mmol/L   Glucose, Bld 107 (H) 70 - 99 mg/dL    Comment: Glucose reference range applies only to samples taken after fasting for at least 8 hours.   BUN 11 6 - 20 mg/dL   Creatinine, Ser 9.51 0.44 - 1.00 mg/dL   Calcium 8.3 (L) 8.9 - 10.3 mg/dL   Total Protein 6.7 6.5 - 8.1 g/dL   Albumin 3.4 (L) 3.5 - 5.0 g/dL   AST 18 15 - 41 U/L   ALT 20 0 - 44 U/L   Alkaline Phosphatase 49 38 - 126 U/L   Total Bilirubin 1.0 0.0 - 1.2 mg/dL   GFR, Estimated >88 >41 mL/min    Comment: (NOTE) Calculated using the CKD-EPI Creatinine Equation (2021)    Anion gap 9 5 - 15    Comment: Performed at [facility], [address] [place], [place] [address]  CBC     Status: None   Collection Time: 02/21/23  8:24 AM  Result Value Ref Range   WBC 5.2 4.0 - 10.5 K/uL   RBC 4.51 3.87 - 5.11 MIL/uL   Hemoglobin 13.3 12.0 - 15.0 g/dL   HCT 01.6 01.0 - 93.2 %   MCV 88.9 80.0 - 100.0 fL   MCH 29.5 26.0 - 34.0 pg   MCHC 33.2 30.0 - 36.0 g/dL   RDW 35.5 73.2 - 20.2 %   Platelets 203 150 - 400 K/uL   nRBC 0.0 0.0 - 0.2 %    Comment: Performed at [facility], [address] [place], [place] [address]  Urinalysis, Routine w reflex microscopic -Urine, Clean Catch     Status: Abnormal   Collection Time: 02/21/23  8:24 AM  Result Value Ref Range   Color, Urine AMBER (A) YELLOW    Comment: BIOCHEMICALS MAY BE AFFECTED BY COLOR   APPearance HAZY (A) CLEAR   Specific Gravity, Urine 1.034 (H) 1.005 - 1.030   pH 5.0 5.0 - 8.0   Glucose, UA NEGATIVE NEGATIVE mg/dL   Hgb urine dipstick SMALL (A) NEGATIVE   Bilirubin Urine NEGATIVE NEGATIVE   Ketones, ur 20 (A) NEGATIVE mg/dL   Protein, ur 623 (A) NEGATIVE  mg/dL   Nitrite NEGATIVE NEGATIVE   Leukocytes,Ua NEGATIVE NEGATIVE   RBC / HPF 11-20 0 - 5 RBC/hpf   WBC, UA 0-5 0 - 5 WBC/hpf   Bacteria, UA NONE SEEN NONE SEEN   Squamous Epithelial / HPF 11-20 0 - 5 /HPF   Mucus PRESENT    Hyaline Casts, UA PRESENT     Comment: Performed at [facility], [address] [place], [place] [address]  Pregnancy, urine     Status: None   Collection Time: 02/21/23  8:24 AM  Result Value Ref Range   Preg Test, Ur NEGATIVE NEGATIVE    Comment:        THE SENSITIVITY OF THIS METHODOLOGY IS >25 mIU/mL. Performed at [facility], [address] [place], [place] [address]   POC urine preg, ED     Status: None   Collection Time: 02/21/23  9:42 AM  Result Value Ref Range   Preg Test, Ur Negative Negative       Assessment & Plan:   Problem List Items Addressed This Visit   None Visit Diagnoses       Need for hepatitis C screening test    -  Primary   Test ordered in office today. Will call with results.  Relevant Orders   CMP14+EGFR   CBC with Diff   Hepatitis C Ab reflex to Quant PCR     Vitamin D deficiency, unspecified       Checking labs today.  Will continue supplements as needed.   Relevant Orders   VITAMIN D 25 Hydroxy (Vit-D Deficiency, Fractures)   CMP14+EGFR   CBC with Diff     Mixed hyperlipidemia       Checking labs today.  Continue current therapy for lipid control. Will modify as needed based on labwork results.   Relevant Orders   Lipid panel   CMP14+EGFR   CBC with Diff     B12 deficiency due to diet       Checking labs today.  Will continue supplements as needed.   Relevant Orders   CMP14+EGFR   Vitamin B12   CBC with Diff     Other fatigue       Relevant Orders   CMP14+EGFR   TSH   CBC with Diff   Iron, TIBC and Ferritin Panel     Prediabetes       A1C is in prediabetic ranges. Patient counseled on dietary choices and verbalized understanding. Will reassess at follow up after next lab  check.   Relevant Orders   CMP14+EGFR   Hemoglobin A1c   CBC with Diff     Essential hypertension, benign       Blood pressure well controlled with current medications.  Continue current therapy.  Will reassess at follow up.   Relevant Orders   CMP14+EGFR   CBC with Diff       Return in about 3 months (around 06/24/2023) for F/U.   Total time spent: 20 minutes  Miki Kins, FNP  03/27/2023   This document may have been prepared by [facility] Voice Recognition software and as such may include unintentional dictation errors.

## 2023-03-28 LAB — CMP14+EGFR
ALT: 29 [IU]/L (ref 0–32)
AST: 15 [IU]/L (ref 0–40)
Albumin: 3.8 g/dL — ABNORMAL LOW (ref 3.9–4.9)
Alkaline Phosphatase: 67 [IU]/L (ref 44–121)
BUN/Creatinine Ratio: 8 — ABNORMAL LOW (ref 9–23)
BUN: 7 mg/dL (ref 6–20)
Bilirubin Total: 0.5 mg/dL (ref 0.0–1.2)
CO2: 23 mmol/L (ref 20–29)
Calcium: 8.7 mg/dL (ref 8.7–10.2)
Chloride: 102 mmol/L (ref 96–106)
Creatinine, Ser: 0.84 mg/dL (ref 0.57–1.00)
Globulin, Total: 2.3 g/dL (ref 1.5–4.5)
Glucose: 74 mg/dL (ref 70–99)
Potassium: 4.2 mmol/L (ref 3.5–5.2)
Sodium: 137 mmol/L (ref 134–144)
Total Protein: 6.1 g/dL (ref 6.0–8.5)
eGFR: 94 mL/min/{1.73_m2} (ref >59)

## 2023-03-28 LAB — CBC WITH DIFFERENTIAL/PLATELET

## 2023-03-28 LAB — IRON,TIBC AND FERRITIN PANEL
Ferritin: 18 ng/mL (ref 15–150)
Iron Saturation: 16 % (ref 15–55)
Iron: 50 ug/dL (ref 27–159)
Total Iron Binding Capacity: 319 ug/dL (ref 250–450)
UIBC: 269 ug/dL (ref 131–425)

## 2023-03-28 LAB — HCV AB W REFLEX TO QUANT PCR: HCV Ab: NONREACTIVE

## 2023-03-28 LAB — LIPID PANEL
Chol/HDL Ratio: 2.3 {ratio} (ref 0.0–4.4)
Cholesterol, Total: 108 mg/dL (ref 100–199)
HDL: 47 mg/dL (ref >39)
LDL Chol Calc (NIH): 49 mg/dL (ref 0–99)
Triglycerides: 49 mg/dL (ref 0–149)
VLDL Cholesterol Cal: 12 mg/dL (ref 5–40)

## 2023-03-28 LAB — VITAMIN B12: Vitamin B-12: 350 pg/mL (ref 232–1245)

## 2023-03-28 LAB — TSH: TSH: 2.02 u[IU]/mL (ref 0.450–4.500)

## 2023-03-28 LAB — HCV INTERPRETATION

## 2023-03-28 LAB — HEMOGLOBIN A1C

## 2023-03-28 LAB — VITAMIN D 25 HYDROXY (VIT D DEFICIENCY, FRACTURES): Vit D, 25-Hydroxy: 11 ng/mL — ABNORMAL LOW (ref 30.0–100.0)

## 2023-04-18 ENCOUNTER — Ambulatory Visit: Admitting: Family

## 2023-04-18 ENCOUNTER — Other Ambulatory Visit

## 2023-04-18 DIAGNOSIS — E538 Deficiency of other specified B group vitamins: Secondary | ICD-10-CM | POA: Diagnosis not present

## 2023-04-18 DIAGNOSIS — I10 Essential (primary) hypertension: Secondary | ICD-10-CM

## 2023-04-18 DIAGNOSIS — R7303 Prediabetes: Secondary | ICD-10-CM

## 2023-04-18 LAB — CBC WITH DIFFERENTIAL/PLATELET
Basophils Absolute: 0.1 10*3/uL (ref 0.0–0.2)
Basos: 1 %
EOS (ABSOLUTE): 0.1 10*3/uL (ref 0.0–0.4)
Eos: 1 %
Hematocrit: 38.5 % (ref 34.0–46.6)
Hemoglobin: 12.3 g/dL (ref 11.1–15.9)
Immature Grans (Abs): 0 10*3/uL (ref 0.0–0.1)
Immature Granulocytes: 0 %
Lymphocytes Absolute: 1.8 10*3/uL (ref 0.7–3.1)
Lymphs: 34 %
MCH: 29.3 pg (ref 26.6–33.0)
MCHC: 31.9 g/dL (ref 31.5–35.7)
MCV: 92 fL (ref 79–97)
Monocytes Absolute: 0.3 10*3/uL (ref 0.1–0.9)
Monocytes: 5 %
Neutrophils Absolute: 3.2 10*3/uL (ref 1.4–7.0)
Neutrophils: 59 %
Platelets: 259 10*3/uL (ref 150–450)
RBC: 4.2 x10E6/uL (ref 3.77–5.28)
RDW: 13.9 % (ref 11.7–15.4)
WBC: 5.4 10*3/uL (ref 3.4–10.8)

## 2023-04-18 LAB — HEMOGLOBIN A1C
Est. average glucose Bld gHb Est-mCnc: 111 mg/dL
Hgb A1c MFr Bld: 5.5 % (ref 4.8–5.6)

## 2023-04-19 ENCOUNTER — Encounter: Payer: Self-pay | Admitting: Family

## 2023-04-19 DIAGNOSIS — E538 Deficiency of other specified B group vitamins: Secondary | ICD-10-CM | POA: Diagnosis not present

## 2023-04-19 MED ORDER — CYANOCOBALAMIN 1000 MCG/ML IJ SOLN
1000.0000 ug | Freq: Once | INTRAMUSCULAR | Status: AC
Start: 2023-04-19 — End: 2023-04-19
  Administered 2023-04-19: 1000 ug via INTRAMUSCULAR

## 2023-05-01 ENCOUNTER — Telehealth: Payer: Self-pay | Admitting: Family

## 2023-05-01 NOTE — Telephone Encounter (Signed)
 Patient called in c/o severe bloating and cramping. She was having these symptoms before she was on Ozempic and believes that the Ozempic actually helped it. She is on Trulance from her GI doctor and that helps some but not much. Any suggestions on meds that she can try? States she is drinking water and eating right. Please advise on what to do.

## 2023-05-03 NOTE — Telephone Encounter (Signed)
 Sent message via mychart to find out how long symptoms have been present and what else she's been experiencing

## 2023-05-04 ENCOUNTER — Other Ambulatory Visit: Payer: Self-pay

## 2023-05-04 DIAGNOSIS — N6001 Solitary cyst of right breast: Secondary | ICD-10-CM

## 2023-05-08 ENCOUNTER — Other Ambulatory Visit: Payer: Self-pay | Admitting: Family

## 2023-05-08 DIAGNOSIS — N6001 Solitary cyst of right breast: Secondary | ICD-10-CM

## 2023-05-17 ENCOUNTER — Ambulatory Visit (INDEPENDENT_AMBULATORY_CARE_PROVIDER_SITE_OTHER): Admitting: Family

## 2023-05-17 DIAGNOSIS — E538 Deficiency of other specified B group vitamins: Secondary | ICD-10-CM | POA: Diagnosis not present

## 2023-05-17 MED ORDER — CYANOCOBALAMIN 1000 MCG/ML IJ SOLN
1000.0000 ug | Freq: Once | INTRAMUSCULAR | Status: AC
  Administered 2023-05-17: 1000 ug via INTRAMUSCULAR

## 2023-05-17 NOTE — Progress Notes (Signed)
   CHIEF COMPLAINT  B12 Shot     REASON FOR VISIT  B12 Injection     ASSESSMENT  B12 Deficiency, Unspecified     PLAN  Diagnoses and all orders for this visit:  B12 deficiency due to diet -     cyanocobalamin (VITAMIN B12) injection 1,000 mcg     Pt. given B12 injection in clinic.  Return for next injection per provider instructions.   Total time spent: 5 minutes  Miki Kins, FNP  05/17/2023

## 2023-05-18 ENCOUNTER — Ambulatory Visit

## 2023-06-04 ENCOUNTER — Ambulatory Visit: Admitting: Family

## 2023-06-07 ENCOUNTER — Ambulatory Visit
Admission: RE | Admit: 2023-06-07 | Discharge: 2023-06-07 | Disposition: A | Discharge status: Home / Self Care | Source: Ambulatory Visit | Attending: Family | Admitting: Family

## 2023-06-07 ENCOUNTER — Encounter: Payer: Self-pay | Admitting: Family

## 2023-06-07 DIAGNOSIS — N6001 Solitary cyst of right breast: Secondary | ICD-10-CM

## 2023-06-07 DIAGNOSIS — N6002 Solitary cyst of left breast: Secondary | ICD-10-CM | POA: Insufficient documentation

## 2023-06-15 ENCOUNTER — Ambulatory Visit

## 2023-06-25 ENCOUNTER — Ambulatory Visit (INDEPENDENT_AMBULATORY_CARE_PROVIDER_SITE_OTHER): Payer: BC Managed Care – PPO | Admitting: Family

## 2023-06-25 ENCOUNTER — Other Ambulatory Visit: Payer: Self-pay | Admitting: Family

## 2023-06-25 ENCOUNTER — Other Ambulatory Visit: Payer: Self-pay

## 2023-06-25 ENCOUNTER — Encounter: Payer: Self-pay | Admitting: Family

## 2023-06-25 VITALS — BP 118/74 | HR 75 | Ht 64.0 in | Wt 193.6 lb

## 2023-06-25 DIAGNOSIS — G43709 Chronic migraine without aura, not intractable, without status migrainosus: Secondary | ICD-10-CM | POA: Diagnosis not present

## 2023-06-25 DIAGNOSIS — R7303 Prediabetes: Secondary | ICD-10-CM | POA: Diagnosis not present

## 2023-06-25 DIAGNOSIS — Z0130 Encounter for examination of blood pressure without abnormal findings: Secondary | ICD-10-CM

## 2023-06-25 DIAGNOSIS — E538 Deficiency of other specified B group vitamins: Secondary | ICD-10-CM | POA: Diagnosis not present

## 2023-06-25 MED ORDER — SEMAGLUTIDE(0.25 OR 0.5MG/DOS) 2 MG/3ML ~~LOC~~ SOPN: 0.2500 mg | PEN_INJECTOR | SUBCUTANEOUS | 0 refills | Status: DC

## 2023-06-25 MED ORDER — CYANOCOBALAMIN 1000 MCG/ML IJ SOLN
1000.0000 ug | Freq: Once | INTRAMUSCULAR | Status: AC
  Administered 2023-06-25: 1000 ug via INTRAMUSCULAR

## 2023-07-18 ENCOUNTER — Other Ambulatory Visit: Payer: Self-pay | Admitting: Family

## 2023-07-18 MED ORDER — WEGOVY 0.25 MG/0.5ML ~~LOC~~ SOAJ: 0.2500 mg | SUBCUTANEOUS | 0 refills | Status: DC

## 2023-07-26 ENCOUNTER — Ambulatory Visit: Admitting: Family

## 2023-07-30 ENCOUNTER — Ambulatory Visit (INDEPENDENT_AMBULATORY_CARE_PROVIDER_SITE_OTHER): Admitting: Family

## 2023-07-30 ENCOUNTER — Encounter: Payer: Self-pay | Admitting: Family

## 2023-07-30 VITALS — BP 118/82 | HR 83 | Ht 64.0 in | Wt 185.4 lb

## 2023-07-30 DIAGNOSIS — Z0130 Encounter for examination of blood pressure without abnormal findings: Secondary | ICD-10-CM

## 2023-07-30 DIAGNOSIS — R7303 Prediabetes: Secondary | ICD-10-CM

## 2023-07-30 DIAGNOSIS — E538 Deficiency of other specified B group vitamins: Secondary | ICD-10-CM

## 2023-07-30 DIAGNOSIS — G43709 Chronic migraine without aura, not intractable, without status migrainosus: Secondary | ICD-10-CM

## 2023-07-30 MED ORDER — CYANOCOBALAMIN 1000 MCG/ML IJ SOLN
1000.0000 ug | Freq: Once | INTRAMUSCULAR | Status: AC
  Administered 2023-07-30: 1000 ug via INTRAMUSCULAR

## 2023-08-14 ENCOUNTER — Other Ambulatory Visit: Payer: Self-pay | Admitting: Family

## 2023-09-10 ENCOUNTER — Encounter: Payer: Self-pay | Admitting: Family

## 2023-09-11 ENCOUNTER — Other Ambulatory Visit: Payer: Self-pay

## 2023-09-11 MED ORDER — TRULANCE 3 MG PO TABS: 1.0000 | ORAL_TABLET | Freq: Every day | ORAL | 3 refills | Status: AC

## 2023-09-11 MED ORDER — WEGOVY 0.5 MG/0.5ML ~~LOC~~ SOAJ: 0.5000 mg | SUBCUTANEOUS | 1 refills | Status: DC

## 2023-09-15 ENCOUNTER — Encounter: Payer: Self-pay | Admitting: Family

## 2023-09-15 NOTE — Progress Notes (Signed)
 Established Patient Office Visit  Subjective:  Patient ID: Brenda Wang, female    DOB: 1990-04-19  Age: 33 y.o. MRN: [id number]  Chief Complaint  Patient presents with   Follow-up    3 month follow up    Patient is here today for her 3 months follow up.  She has been feeling fairly well since last appointment.   She does have additional concerns to discuss today.  She would like to restart the Ozempic , says she was having less trouble with her GI system while she was taking it.   Labs are not due today.  She needs refills.   I have reviewed her active problem list, medication list, allergies, notes from last encounter, lab results for her appointment today.      No other concerns at this time.   Past Medical History:  Diagnosis Date   Anemia    Anxiety    H/O: cesarean section    FTP and NRFHT   Headache    migraines    Migraine    PONV (postoperative nausea and vomiting)    nausea and vomiting after 2nd c-section   Seizures (HCC)    as a child, none recently.    Past Surgical History:  Procedure Laterality Date   CESAREAN SECTION  01/23/2014   CESAREAN SECTION N/A 08/10/2015   Procedure: CESAREAN SECTION;  Surgeon: Glory High, MD;  Location: [facility] ORS;  Service: Obstetrics;  Laterality: N/A;   CESAREAN SECTION WITH BILATERAL TUBAL LIGATION Bilateral 09/26/2016   Procedure: CESAREAN SECTION WITH BILATERAL TUBAL LIGATION;  Surgeon: High Glory, MD;  Location: [facility];  Service: Obstetrics;  Laterality: Bilateral;   COLONOSCOPY WITH PROPOFOL  N/A 11/01/2020   Procedure: COLONOSCOPY WITH PROPOFOL ;  Surgeon: Therisa Bi, MD;  Location: [facility] ENDOSCOPY;  Service: Gastroenterology;  Laterality: N/A;   COLONOSCOPY WITH PROPOFOL  N/A 04/11/2021   Procedure: COLONOSCOPY WITH PROPOFOL ;  Surgeon: Therisa Bi, MD;  Location: [facility] ENDOSCOPY;  Service: Gastroenterology;  Laterality: N/A;   CYSTOSCOPY N/A 05/20/2019   Procedure: CYSTOSCOPY;  Surgeon: Arloa Lamar SHAUNNA, MD;  Location: [facility] ORS;  Service: Gynecology;  Laterality: N/A;   LAPAROSCOPIC BILATERAL SALPINGECTOMY  05/20/2019   Procedure: LAPAROSCOPIC BILATERAL SALPINGECTOMY;  Surgeon: Arloa Lamar SHAUNNA, MD;  Location: [facility] ORS;  Service: Gynecology;;   LAPAROSCOPIC LYSIS OF ADHESIONS  05/20/2019   Procedure: LAPAROSCOPIC LYSIS OF ADHESIONS;  Surgeon: Arloa Lamar SHAUNNA, MD;  Location: [facility];  Service: Gynecology;;   LAPAROSCOPY N/A 05/20/2019   Procedure: LAPAROSCOPY DIAGNOSTIC;  Surgeon: Arloa Lamar SHAUNNA, MD;  Location: [facility];  Service: Gynecology;  Laterality: N/A;   THERAPEUTIC ABORTION     TONSILLECTOMY     TUBAL LIGATION  09/26/2016   [facility]   TYMPANOSTOMY TUBE PLACEMENT Bilateral    WISDOM TOOTH EXTRACTION      Social History   Socioeconomic History   Marital status: Married    Spouse name: Not on file   Number of children: 3   Years of education: college   Highest education level: Bachelor's degree (e.g., BA, AB, BS)  Occupational History   Occupation: Teller at [employer]  Tobacco Use   Smoking status: Never   Smokeless tobacco: Never  Vaping Use   Vaping status: Never Used  Substance and Sexual Activity   Alcohol use: Yes    Alcohol/week: 7.0 standard drinks of alcohol    Types: 7 Glasses of wine per week    Comment: glass of wine daily   Drug use: Not  Currently    Types: Marijuana    Comment: socially   Sexual activity: Yes    Birth control/protection: Surgical    Comment: BTL  Other Topics Concern   Not on file  Social History Narrative   Lives at home with her family.   Right-handed.   No daily use of caffeine .   Social Drivers of Corporate investment banker Strain: Not on file  Food Insecurity: Not on file  Transportation Needs: Not on file  Physical Activity: Not on file  Stress: Not on file  Social Connections: Not on file  Intimate Partner Violence: Not on file    Family History  Problem Relation Age of Onset   Anemia Mother    Anxiety disorder  Mother    Kidney disease Father    Hypertension Paternal Grandmother    Diabetes Paternal Grandmother    Colon cancer Paternal Grandfather    Breast cancer Other     Allergies  Allergen Reactions   Sulfa Antibiotics Hives    Review of Systems  All other systems reviewed and are negative.      Objective:   BP 118/74   Pulse 75   Ht 5' 4 (1.626 m)   Wt 193 lb 9.6 oz (87.8 kg)   LMP 05/07/2023 Comment: BTL  SpO2 99%   BMI 33.23 kg/m   Vitals:   06/25/23 0940  BP: 118/74  Pulse: 75  Height: 5' 4 (1.626 m)  Weight: 193 lb 9.6 oz (87.8 kg)  SpO2: 99%  BMI (Calculated): 33.21    Physical Exam Vitals and nursing note reviewed.  Constitutional:      Appearance: Normal appearance. She is normal weight.  HENT:     Head: Normocephalic.  Eyes:     Extraocular Movements: Extraocular movements intact.     Conjunctiva/sclera: Conjunctivae normal.     Pupils: Pupils are equal, round, and reactive to light.  Cardiovascular:     Rate and Rhythm: Normal rate.  Pulmonary:     Effort: Pulmonary effort is normal.  Neurological:     General: No focal deficit present.     Mental Status: She is alert and oriented to person, place, and time. Mental status is at baseline.  Psychiatric:        Mood and Affect: Mood normal.        Behavior: Behavior normal.        Thought Content: Thought content normal.        Judgment: Judgment normal.    No results found for any visits on 06/25/23.  No results found for this or any previous visit (from the past 2160 hours).     Assessment & Plan B12 deficiency due to diet B12 injection given in office today.  Return for next injection per provider instructions.  Chronic migraine w/o aura w/o status migrainosus, not intractable Patient stable.  Well controlled with current therapy.   Continue current meds.   Prediabetes Will see if we are able to get the Ozempic  approved again, unsure if we can.      Return in about 3 months  (around 09/25/2023) for F/U.   Total time spent: 20 minutes  ALAN CHRISTELLA ARRANT, FNP  06/25/2023   This document may have been prepared by [facility] Voice Recognition software and as such may include unintentional dictation errors.

## 2023-09-15 NOTE — Assessment & Plan Note (Signed)
 Patient stable.  Well controlled with current therapy.   Continue current meds.

## 2023-09-24 ENCOUNTER — Encounter: Payer: Self-pay | Admitting: Family

## 2023-09-24 NOTE — Assessment & Plan Note (Signed)
 Patient stable.  Well controlled with current therapy.   Continue current meds.

## 2023-09-24 NOTE — Progress Notes (Signed)
 Established Patient Office Visit  Subjective:  Patient ID: Brenda Wang, female    DOB: 12-05-1990  Age: 33 y.o. MRN: [id number]  Chief Complaint  Patient presents with   Follow-up    1 month follow up    Patient is here today for her 1 month follow up.  She has been feeling well since last appointment.   She does not have additional concerns to discuss today.  Labs are not due today.  She needs refills.   I have reviewed her active problem list, medication list, allergies, notes from last encounter, lab results for her appointment today.      No other concerns at this time.   Past Medical History:  Diagnosis Date   Anemia    Anxiety    H/O: cesarean section    FTP and NRFHT   Headache    migraines    Migraine    PONV (postoperative nausea and vomiting)    nausea and vomiting after 2nd c-section   Seizures (HCC)    as a child, none recently.    Past Surgical History:  Procedure Laterality Date   CESAREAN SECTION  01/23/2014   CESAREAN SECTION N/A 08/10/2015   Procedure: CESAREAN SECTION;  Surgeon: Glory High, MD;  Location: [facility];  Service: Obstetrics;  Laterality: N/A;   CESAREAN SECTION WITH BILATERAL TUBAL LIGATION Bilateral 09/26/2016   Procedure: CESAREAN SECTION WITH BILATERAL TUBAL LIGATION;  Surgeon: High Glory, MD;  Location: [facility];  Service: Obstetrics;  Laterality: Bilateral;   COLONOSCOPY WITH PROPOFOL N/A 11/01/2020   Procedure: COLONOSCOPY WITH PROPOFOL;  Surgeon: Therisa Bi, MD;  Location: [facility] ENDOSCOPY;  Service: Gastroenterology;  Laterality: N/A;   COLONOSCOPY WITH PROPOFOL N/A 04/11/2021   Procedure: COLONOSCOPY WITH PROPOFOL;  Surgeon: Therisa Bi, MD;  Location: [facility] ENDOSCOPY;  Service: Gastroenterology;  Laterality: N/A;   CYSTOSCOPY N/A 05/20/2019   Procedure: CYSTOSCOPY;  Surgeon: Arloa Lamar SHAUNNA, MD;  Location: [facility] ORS;  Service: Gynecology;  Laterality: N/A;   LAPAROSCOPIC BILATERAL SALPINGECTOMY  05/20/2019    Procedure: LAPAROSCOPIC BILATERAL SALPINGECTOMY;  Surgeon: Arloa Lamar SHAUNNA, MD;  Location: [facility];  Service: Gynecology;;   LAPAROSCOPIC LYSIS OF ADHESIONS  05/20/2019   Procedure: LAPAROSCOPIC LYSIS OF ADHESIONS;  Surgeon: Arloa Lamar SHAUNNA, MD;  Location: [facility];  Service: Gynecology;;   LAPAROSCOPY N/A 05/20/2019   Procedure: LAPAROSCOPY DIAGNOSTIC;  Surgeon: Arloa Lamar SHAUNNA, MD;  Location: [facility];  Service: Gynecology;  Laterality: N/A;   THERAPEUTIC ABORTION     TONSILLECTOMY     TUBAL LIGATION  09/26/2016   [facility]   TYMPANOSTOMY TUBE PLACEMENT Bilateral    WISDOM TOOTH EXTRACTION      Social History   Socioeconomic History   Marital status: Married    Spouse name: Not on file   Number of children: 3   Years of education: college   Highest education level: Bachelor's degree (e.g., BA, AB, BS)  Occupational History   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   Right-handed.   No daily use of caffeine .   Social Drivers of Corporate investment banker Strain: Not on file  Food Insecurity: Not on file  Transportation Needs: Not on file  Physical Activity: Not on file  Stress: Not on file  Social Connections: Not on file  Intimate Partner Violence: Not on file    Family History  Problem Relation Age of Onset   Anemia Mother    Anxiety disorder Mother    Kidney disease Father    Hypertension Paternal Grandmother    Diabetes Paternal Grandmother    Colon  cancer Paternal Grandfather    Breast cancer Other     Allergies  Allergen Reactions   Sulfa Antibiotics Hives    Review of Systems  All other systems reviewed and are negative.      Objective:   BP 118/82   Pulse 83   Ht 5' 4 (1.626 m)   Wt 185 lb 6.4 oz (84.1 kg)   SpO2 99%   BMI 31.82 kg/m   Vitals:   07/30/23 1149  BP: 118/82  Pulse: 83  Height: 5' 4 (1.626 m)  Weight: 185 lb 6.4 oz (84.1 kg)  SpO2: 99%  BMI (Calculated): 31.81    Physical Exam Vitals and nursing note reviewed.  Constitutional:      Appearance: Normal appearance. She is normal weight.  HENT:     Head: Normocephalic.  Eyes:     Extraocular Movements: Extraocular movements intact.     Conjunctiva/sclera: Conjunctivae normal.     Pupils: Pupils are equal, round, and reactive to light.  Cardiovascular:     Rate and Rhythm: Normal rate.  Pulmonary:     Effort: Pulmonary effort is normal.  Neurological:     General: No focal deficit present.     Mental Status: She is alert and oriented to person, place, and time. Mental status is at baseline.  Psychiatric:        Mood and Affect: Mood normal.        Behavior: Behavior normal.        Thought Content: Thought content normal.      No results found for any visits on 07/30/23.  No results found for this or any previous visit (from the past 2160 hours).     Assessment & Plan B12 deficiency due to diet B12 injection given in office today.  Return for next injection per provider instructions.  Prediabetes A1C Continues to be in prediabetic ranges.  Will reassess at follow up after next lab check.  Patient counseled on dietary choices and verbalized understanding.   Chronic migraine w/o aura w/o status migrainosus, not intractable Patient stable.  Well controlled with current therapy.   Continue current meds.      Return in about 2 months (around 09/29/2023).   Total time spent: 20 minutes  ALAN CHRISTELLA ARRANT,  FNP  07/30/2023   This document may have been prepared by [facility] Voice Recognition software and as such may include unintentional dictation errors.

## 2023-09-25 ENCOUNTER — Ambulatory Visit (INDEPENDENT_AMBULATORY_CARE_PROVIDER_SITE_OTHER): Admitting: Family

## 2023-09-25 ENCOUNTER — Encounter: Payer: Self-pay | Admitting: Family

## 2023-09-25 VITALS — BP 118/78 | HR 88 | Ht 64.0 in | Wt 197.4 lb

## 2023-09-25 DIAGNOSIS — I10 Essential (primary) hypertension: Secondary | ICD-10-CM | POA: Diagnosis not present

## 2023-09-25 DIAGNOSIS — R5383 Other fatigue: Secondary | ICD-10-CM

## 2023-09-25 DIAGNOSIS — R7303 Prediabetes: Secondary | ICD-10-CM | POA: Diagnosis not present

## 2023-09-25 DIAGNOSIS — E782 Mixed hyperlipidemia: Secondary | ICD-10-CM

## 2023-09-25 DIAGNOSIS — G43709 Chronic migraine without aura, not intractable, without status migrainosus: Secondary | ICD-10-CM

## 2023-09-25 DIAGNOSIS — E538 Deficiency of other specified B group vitamins: Secondary | ICD-10-CM

## 2023-09-25 DIAGNOSIS — E559 Vitamin D deficiency, unspecified: Secondary | ICD-10-CM

## 2023-09-25 MED ORDER — WEGOVY 1 MG/0.5ML ~~LOC~~ SOAJ: 1.0000 mg | SUBCUTANEOUS | 0 refills | Status: DC

## 2023-09-25 MED ORDER — CYANOCOBALAMIN 1000 MCG/ML IJ SOLN
1000.0000 ug | Freq: Once | INTRAMUSCULAR | Status: AC
  Administered 2023-09-25 (×2): 1000 ug via INTRAMUSCULAR

## 2023-09-25 NOTE — Assessment & Plan Note (Signed)
 Patient stable.  Well controlled with current therapy.   Continue current meds.

## 2023-09-25 NOTE — Progress Notes (Signed)
 Established Patient Office Visit  Subjective:  Patient ID: Brenda Wang, female    DOB: [birth year]  Age: 33 y.o. MRN: [id number]  Chief Complaint  Patient presents with   Follow-up    2 month follow up    Patient is here today for her 2 months follow up.  She has been feeling fairly well since last appointment.   She does not have additional concerns to discuss today.  Labs are due today.  She needs refills.   I have reviewed her active problem list, medication list, allergies, notes from last encounter, lab results for her appointment today.      No other concerns at this time.   Past Medical History:  Diagnosis Date   Anemia    Anxiety    H/O: cesarean section    FTP and NRFHT   Headache    migraines    Migraine    PONV (postoperative nausea and vomiting)    nausea and vomiting after 2nd c-section   Seizures (HCC)    as a child, none recently.    Past Surgical History:  Procedure Laterality Date   CESAREAN SECTION  01/23/2014   CESAREAN SECTION N/A 08/10/2015   Procedure: CESAREAN SECTION;  Surgeon: Glory High, MD;  Location: [facility] ORS;  Service: Obstetrics;  Laterality: N/A;   CESAREAN SECTION WITH BILATERAL TUBAL LIGATION Bilateral 09/26/2016   Procedure: CESAREAN SECTION WITH BILATERAL TUBAL LIGATION;  Surgeon: High Glory, MD;  Location: [facility] OR[facility];  Service: Obstetrics;  Laterality: Bilateral;   COLONOSCOPY WITH PROPOFOL  N/A 11/01/2020   Procedure: COLONOSCOPY WITH PROPOFOL ;  Surgeon: Therisa Bi, MD;  Location: [facility] ENDOSCOPY;  Service: Gastroenterology;  Laterality: N/A;   COLONOSCOPY WITH PROPOFOL  N/A 04/11/2021   Procedure: COLONOSCOPY WITH PROPOFOL ;  Surgeon: Therisa Bi, MD;  Location: [facility] ENDOSCOPY;  Service: Gastroenterology;  Laterality: N/A;   CYSTOSCOPY N/A 05/20/2019   Procedure: CYSTOSCOPY;  Surgeon: Arloa Lamar SHAUNNA, MD;  Location: [facility] ORS;  Service: Gynecology;  Laterality: N/A;   LAPAROSCOPIC BILATERAL SALPINGECTOMY  05/20/2019    Procedure: LAPAROSCOPIC BILATERAL SALPINGECTOMY;  Surgeon: Arloa Lamar SHAUNNA, MD;  Location: [facility] ORS;  Service: Gynecology;;   LAPAROSCOPIC LYSIS OF ADHESIONS  05/20/2019   Procedure: LAPAROSCOPIC LYSIS OF ADHESIONS;  Surgeon: Arloa Lamar SHAUNNA, MD;  Location: [facility] OR[facility];  Service: Gynecology;;   LAPAROSCOPY N/A 05/20/2019   Procedure: LAPAROSCOPY DIAGNOSTIC;  Surgeon: Arloa Lamar SHAUNNA, MD;  Location: [facility] ORS;  Service: Gynecology;  Laterality: N/A;   THERAPEUTIC ABORTION     TONSILLECTOMY     TUBAL LIGATION  09/26/2016   [facility]   TYMPANOSTOMY TUBE PLACEMENT Bilateral    WISDOM TOOTH EXTRACTION      Social History   Socioeconomic History   Marital status: Married    Spouse name: Not on file   Number of children: 3   Years of education: college   Highest education level: Bachelor's degree (e.g., BA, AB, BS)  Occupational History   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   Right-handed.   No daily use of caffeine.   Social Drivers of Corporate investment banker Strain: Not on file  Food Insecurity: Not on file  Transportation Needs: Not on file  Physical Activity: Not on file  Stress: Not on file  Social Connections: Not on file  Intimate Partner Violence: Not on file    Family History  Problem Relation Age of Onset   Anemia Mother    Anxiety disorder Mother    Kidney disease Father    Hypertension Paternal Grandmother    Diabetes Paternal Grandmother    Colon  cancer Paternal Grandfather    Breast cancer Other     Allergies  Allergen Reactions   Sulfa Antibiotics Hives    Review of Systems  Skin:  Positive for rash.  All other systems reviewed and are negative.      Objective:   BP 118/78   Pulse 88   Ht 5' 4 (1.626 m)   Wt 197 lb 6.4 oz (89.5 kg)   SpO2 99%   BMI 33.88 kg/m   Vitals:   09/25/23 1002  BP: 118/78  Pulse: 88  Height: 5' 4 (1.626 m)  Weight: 197 lb 6.4 oz (89.5 kg)  SpO2: 99%  BMI (Calculated): 33.87    Physical Exam Vitals and nursing note reviewed.  Constitutional:      Appearance: Normal appearance. She is normal weight.  HENT:     Head: Normocephalic and atraumatic.  Eyes:     Extraocular Movements: Extraocular movements intact.     Conjunctiva/sclera: Conjunctivae normal.     Pupils: Pupils are equal, round, and reactive to light.  Cardiovascular:     Rate and Rhythm: Normal rate and regular rhythm.     Pulses: Normal pulses.  Pulmonary:     Effort: Pulmonary effort is normal.     Breath sounds: Normal breath sounds.  Musculoskeletal:     Cervical back: Normal range of motion.  Neurological:     General: No focal deficit present.     Mental Status: She is alert and oriented to person, place, and time. Mental status is at baseline.  Psychiatric:        Mood and Affect: Mood normal.        Behavior: Behavior normal.        Thought Content: Thought content normal.        Judgment: Judgment normal.      No results found for any visits on 09/25/23.  No results found for this or any previous visit (from the past 2160 hours).     Assessment & Plan Chronic migraine w/o aura w/o status migrainosus, not intractable Patient stable.  Well controlled with current therapy.   Continue current meds.   Prediabetes A1C Continues to be in prediabetic ranges.  Will reassess at follow up after next lab check.  Patient counseled on dietary choices and verbalized understanding.   -CBC  w/Diff -CMP w/eGFR -Hemoglobin A1C  Vitamin D deficiency, unspecified B12 deficiency due to diet Other fatigue Checking labs today.  Will continue supplements as needed.   - Vitamin D - Vitamin B12 - TSH  Mixed hyperlipidemia Checking labs today.  Continue current therapy for lipid control. Will modify as needed based on labwork results.   -CMP w/eGFR -Lipid Panel  Essential hypertension, benign Blood pressure well controlled with  current medications.  Continue current therapy.  Will reassess at follow up.   - CBC w/Diff - CMP w/eGFR     Return in about 3 months (around 12/26/2023).   Total time spent: 20 minutes  ALAN CHRISTELLA ARRANT, FNP  09/25/2023   This document may have been prepared by [facility] Voice Recognition software and as such may include unintentional dictation errors.

## 2023-09-26 LAB — CBC WITH DIFFERENTIAL/PLATELET
Basophils Absolute: 0.1 x10E3/uL (ref 0.0–0.2)
Basos: 1 %
EOS (ABSOLUTE): 0.1 x10E3/uL (ref 0.0–0.4)
Eos: 1 %
Hematocrit: 40.6 % (ref 34.0–46.6)
Hemoglobin: 12.6 g/dL (ref 11.1–15.9)
Immature Grans (Abs): 0 x10E3/uL (ref 0.0–0.1)
Immature Granulocytes: 0 %
Lymphocytes Absolute: 2.2 x10E3/uL (ref 0.7–3.1)
Lymphs: 35 %
MCH: 29.1 pg (ref 26.6–33.0)
MCHC: 31 g/dL — ABNORMAL LOW (ref 31.5–35.7)
MCV: 94 fL (ref 79–97)
Monocytes Absolute: 0.3 x10E3/uL (ref 0.1–0.9)
Monocytes: 5 %
Neutrophils Absolute: 3.6 x10E3/uL (ref 1.4–7.0)
Neutrophils: 58 %
Platelets: 288 x10E3/uL (ref 150–450)
RBC: 4.33 x10E6/uL (ref 3.77–5.28)
RDW: 14.3 % (ref 11.7–15.4)
WBC: 6.3 x10E3/uL (ref 3.4–10.8)

## 2023-09-26 LAB — LIPID PANEL
Chol/HDL Ratio: 2.1 ratio (ref 0.0–4.4)
Cholesterol, Total: 116 mg/dL (ref 100–199)
HDL: 54 mg/dL (ref >39)
LDL Chol Calc (NIH): 51 mg/dL (ref 0–99)
Triglycerides: 43 mg/dL (ref 0–149)
VLDL Cholesterol Cal: 11 mg/dL (ref 5–40)

## 2023-09-26 LAB — CMP14+EGFR
ALT: 14 IU/L (ref 0–32)
AST: 17 IU/L (ref 0–40)
Albumin: 4 g/dL (ref 3.9–4.9)
Alkaline Phosphatase: 72 IU/L (ref 44–121)
BUN/Creatinine Ratio: 12 (ref 9–23)
BUN: 11 mg/dL (ref 6–20)
Bilirubin Total: 0.4 mg/dL (ref 0.0–1.2)
CO2: 20 mmol/L (ref 20–29)
Calcium: 8.6 mg/dL — ABNORMAL LOW (ref 8.7–10.2)
Chloride: 103 mmol/L (ref 96–106)
Creatinine, Ser: 0.92 mg/dL (ref 0.57–1.00)
Globulin, Total: 2.5 g/dL (ref 1.5–4.5)
Glucose: 81 mg/dL (ref 70–99)
Potassium: 4.5 mmol/L (ref 3.5–5.2)
Sodium: 136 mmol/L (ref 134–144)
Total Protein: 6.5 g/dL (ref 6.0–8.5)
eGFR: 84 mL/min/1.73 (ref >59)

## 2023-09-26 LAB — TSH: TSH: 1.38 u[IU]/mL (ref 0.450–4.500)

## 2023-09-26 LAB — HEMOGLOBIN A1C
Est. average glucose Bld gHb Est-mCnc: 108 mg/dL
Hgb A1c MFr Bld: 5.4 % (ref 4.8–5.6)

## 2023-09-26 LAB — VITAMIN B12: Vitamin B-12: 341 pg/mL (ref 232–1245)

## 2023-09-26 LAB — VITAMIN D 25 HYDROXY (VIT D DEFICIENCY, FRACTURES): Vit D, 25-Hydroxy: 26.6 ng/mL — ABNORMAL LOW (ref 30.0–100.0)

## 2023-09-27 ENCOUNTER — Ambulatory Visit: Admitting: Family

## 2023-10-17 ENCOUNTER — Ambulatory Visit: Payer: BC Managed Care – PPO

## 2023-10-25 ENCOUNTER — Ambulatory Visit

## 2023-10-26 ENCOUNTER — Ambulatory Visit: Payer: Self-pay

## 2023-10-26 IMAGING — CT CT HEAD W/O CM
4 series · 16 of 47 positions shown, 18 images · non-contrast
Comparison: None.

CLINICAL DATA: Syncope/presyncope, cerebrovascular cause suspected;
chronic syncopal episodes/frontal headache at present



[Series 2: head wo · axial · 0.41mm/px · z∈[-107,-2]mm · 7 of 29 slices shown, 9 images]
[im 4/29  brain]
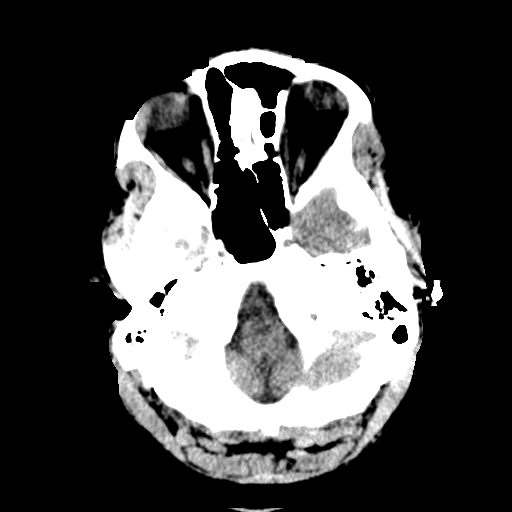
[im 4/29  bone]
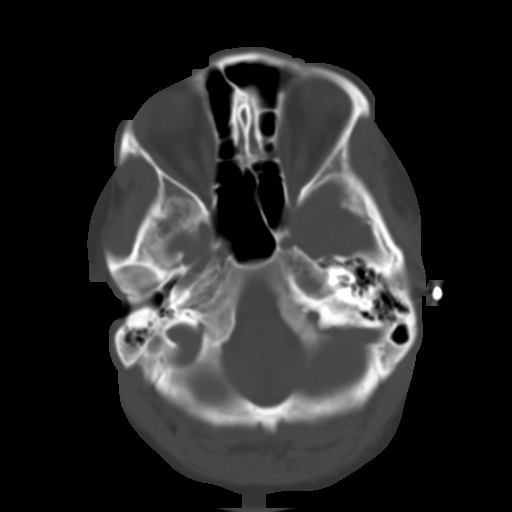
[im 8/29  brain]
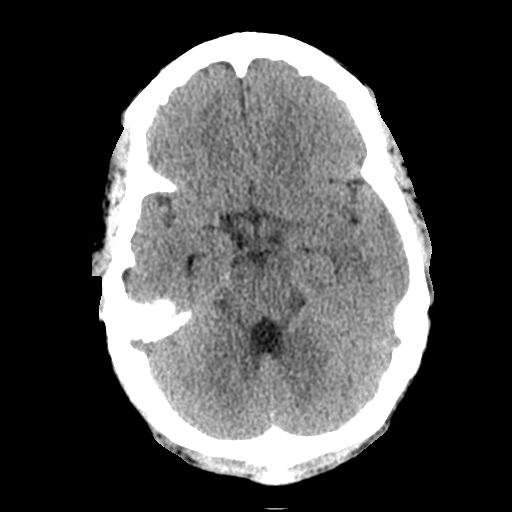
[im 11/29  brain]
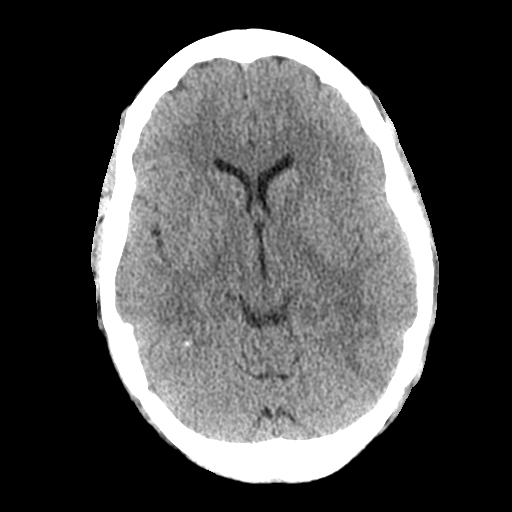
[im 15/29  brain]
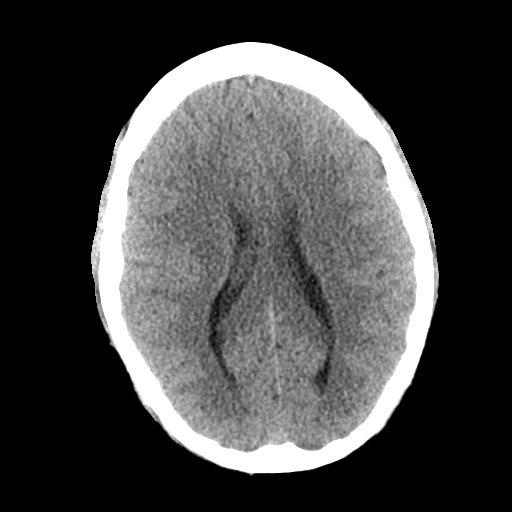
[im 18/29  brain]
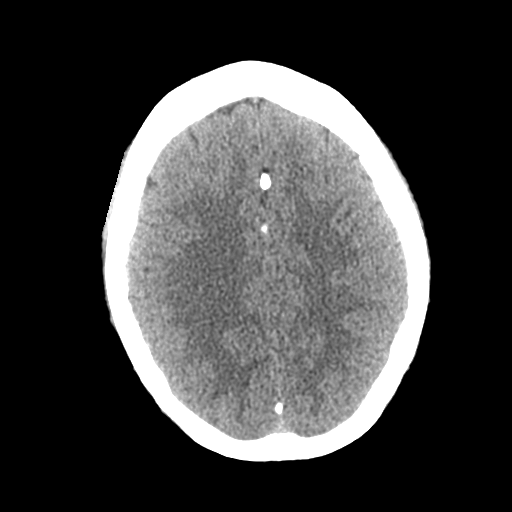
[im 18/29  bone]
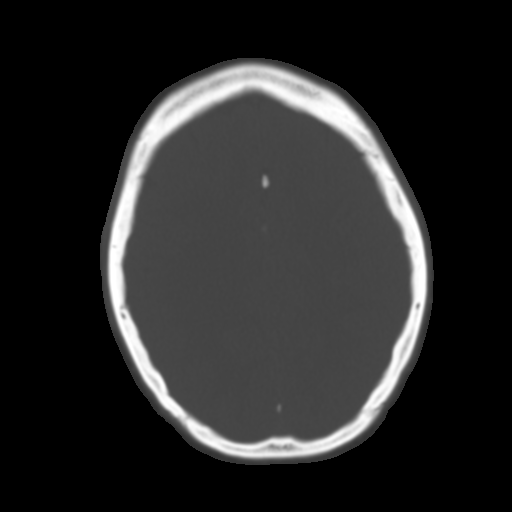
[im 22/29  brain]
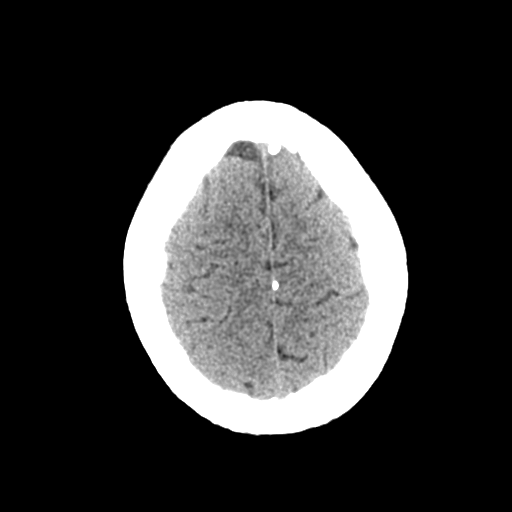
[im 25/29  brain]
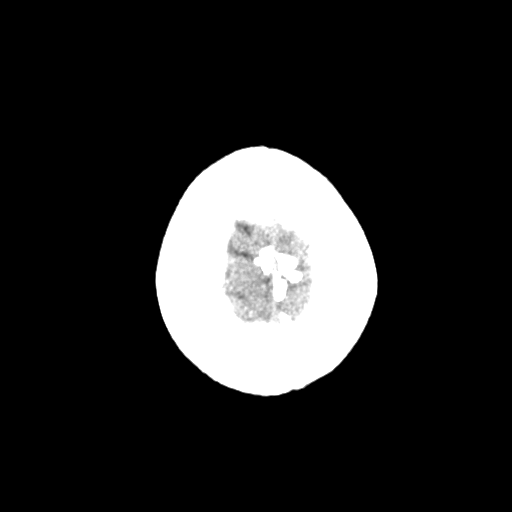

[Series 3: head bone · axial · 0.41mm/px · z∈[-108,-80]mm · 3 of 72 slices shown]
[im 8/72  bone]
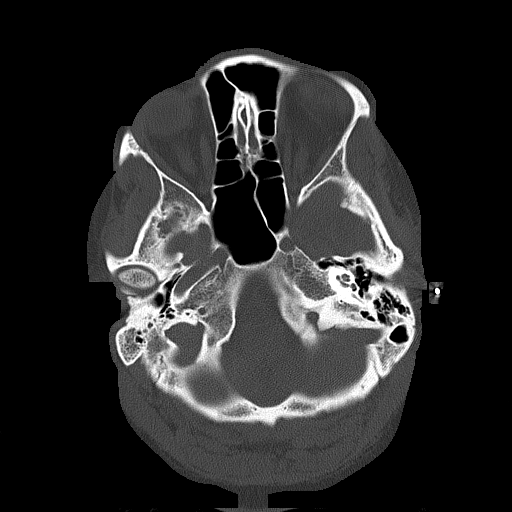
[im 15/72  bone]
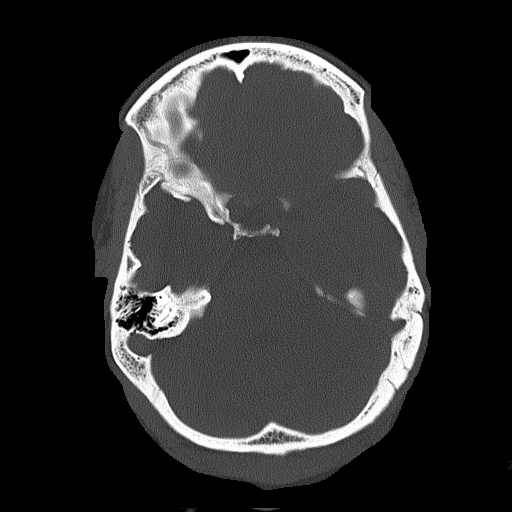
[im 22/72  bone]
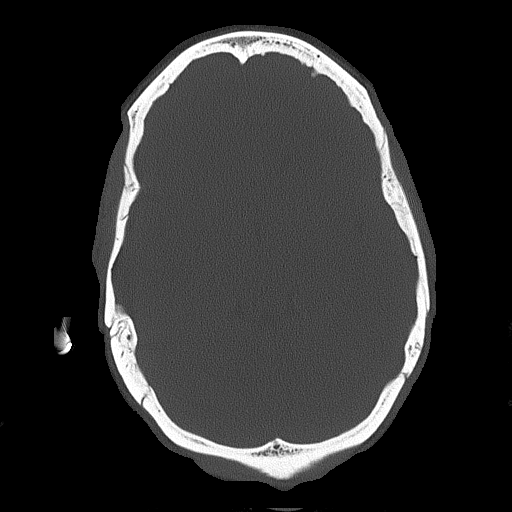

[Series 4: coronal soft tissue · coronal · 0.31mm/px · 3 of 65 slices shown]
[im 22/65  brain]
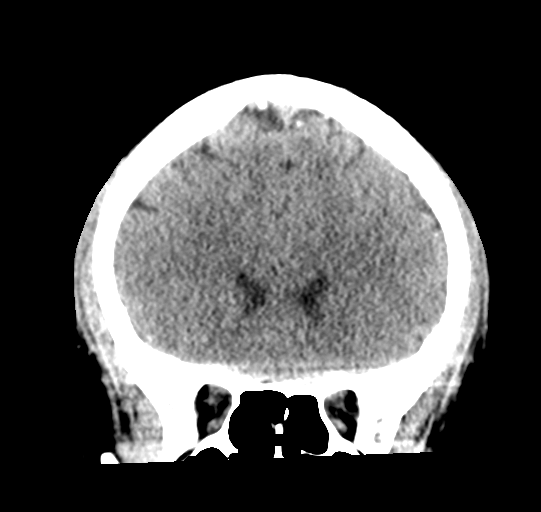
[im 29/65  brain]
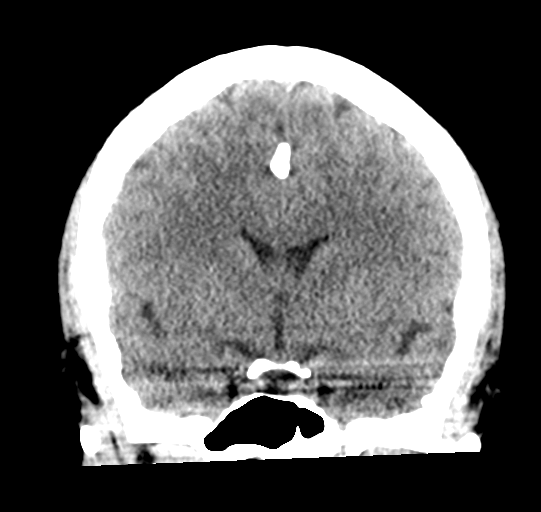
[im 36/65  brain]
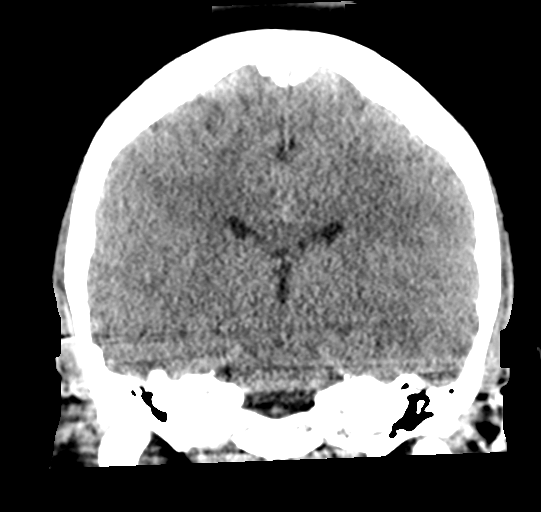

[Series 5: sagittal soft tissue · sagittal · 0.30mm/px · 3 of 57 slices shown]
[im 19/57  brain]
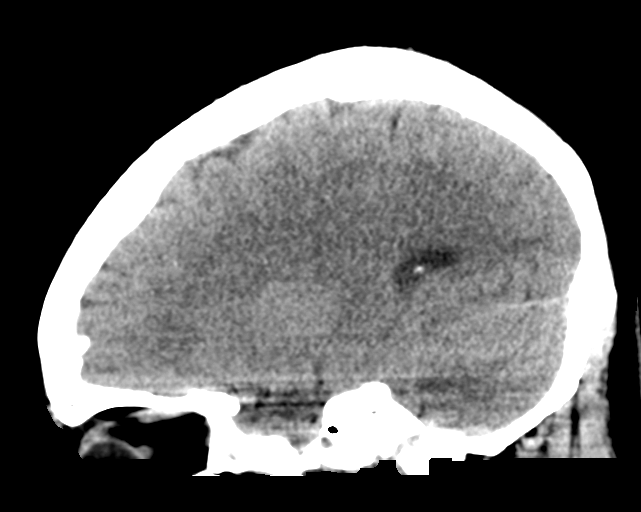
[im 29/57  brain]
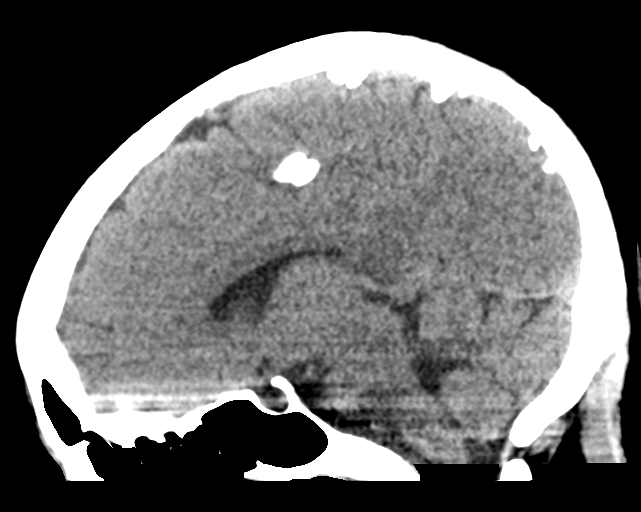
[im 38/57  brain]
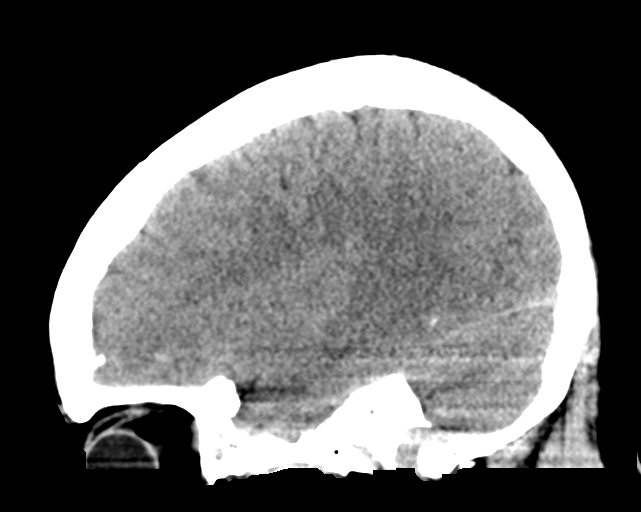

[16 of 47 positions shown; findings below may reference images not displayed]

FINDINGS: Streak artifact from earrings.

Brain: There is no acute intracranial hemorrhage, mass effect, or
edema. Gray-white differentiation is preserved. There is no
extra-axial fluid collection. Ventricles and sulci are within normal
limits in size and configuration.

Vascular: No hyperdense vessel or unexpected calcification.

Skull: Calvarium is unremarkable.

Sinuses/Orbits: No acute finding.

Other: None.
IMPRESSION: No acute intracranial abnormality.

## 2023-11-08 ENCOUNTER — Encounter: Payer: Self-pay | Admitting: Family

## 2023-11-12 ENCOUNTER — Other Ambulatory Visit: Payer: Self-pay | Admitting: Family

## 2023-11-13 ENCOUNTER — Other Ambulatory Visit: Payer: Self-pay

## 2023-11-13 MED ORDER — WEGOVY 1 MG/0.5ML ~~LOC~~ SOAJ: 1.0000 mg | SUBCUTANEOUS | 0 refills | Status: DC

## 2023-11-15 ENCOUNTER — Other Ambulatory Visit: Payer: Self-pay

## 2023-11-15 ENCOUNTER — Emergency Department
Admission: EM | Admit: 2023-11-15 | Discharge: 2023-11-15 | Disposition: A | Discharge status: Home / Self Care | Attending: Emergency Medicine | Admitting: Emergency Medicine

## 2023-11-15 ENCOUNTER — Emergency Department

## 2023-11-15 DIAGNOSIS — G43809 Other migraine, not intractable, without status migrainosus: Secondary | ICD-10-CM | POA: Insufficient documentation

## 2023-11-15 DIAGNOSIS — R55 Syncope and collapse: Secondary | ICD-10-CM | POA: Insufficient documentation

## 2023-11-15 LAB — CBC
HCT: 39.3 % (ref 36.0–46.0)
Hemoglobin: 13.4 g/dL (ref 12.0–15.0)
MCH: 29.6 pg (ref 26.0–34.0)
MCHC: 34.1 g/dL (ref 30.0–36.0)
MCV: 86.8 fL (ref 80.0–100.0)
Platelets: 310 10*3/uL (ref 150–400)
RBC: 4.53 MIL/uL (ref 3.87–5.11)
RDW: 12.8 % (ref 11.5–15.5)
WBC: 5.6 10*3/uL (ref 4.0–10.5)
nRBC: 0 % (ref 0.0–0.2)

## 2023-11-15 LAB — BASIC METABOLIC PANEL WITH GFR
Anion gap: 12 (ref 5–15)
BUN: 9 mg/dL (ref 6–20)
CO2: 24 mmol/L (ref 22–32)
Calcium: 9.2 mg/dL (ref 8.9–10.3)
Chloride: 100 mmol/L (ref 98–111)
Creatinine, Ser: 0.82 mg/dL (ref 0.44–1.00)
GFR, Estimated: >60 mL/min (ref >60)
Glucose, Bld: 106 mg/dL — ABNORMAL HIGH (ref 70–99)
Potassium: 3.6 mmol/L (ref 3.5–5.1)
Sodium: 136 mmol/L (ref 135–145)

## 2023-11-15 MED ORDER — KETOROLAC TROMETHAMINE 30 MG/ML IJ SOLN
30.0000 mg | Freq: Once | INTRAMUSCULAR | Status: AC
  Administered 2023-11-15: 30 mg via INTRAMUSCULAR
  Filled 2023-11-15: qty 1

## 2023-11-15 MED ORDER — METOCLOPRAMIDE HCL 10 MG PO TABS
10.0000 mg | ORAL_TABLET | Freq: Once | ORAL | Status: AC
  Administered 2023-11-15: 10 mg via ORAL
  Filled 2023-11-15: qty 1

## 2023-11-15 MED ORDER — DIPHENHYDRAMINE HCL 25 MG PO CAPS
25.0000 mg | ORAL_CAPSULE | Freq: Once | ORAL | Status: AC
  Administered 2023-11-15: 25 mg via ORAL
  Filled 2023-11-15: qty 1

## 2023-11-15 MED ORDER — METOCLOPRAMIDE HCL 10 MG PO TABS: 10.0000 mg | ORAL_TABLET | Freq: Three times a day (TID) | ORAL | 0 refills | Status: AC | PRN

## 2023-11-15 NOTE — ED Provider Notes (Signed)
 [facility] Emergency Department Provider Note     Event Date/Time   First MD Initiated Contact with Patient 11/15/23 1609     (approximate)   History   Headache and Loss of Consciousness   HPI  Brenda Wang is a 33 y.o. female with a history of migraine headaches, seizure disorder, anxiety, and anemia who presents to the ED via EMS.  Patient was at work, when she apparently had a syncopal episode.  The episode was unwitnessed, but patient does have a history of the same.  She apparently experienced a sudden headache, while at work.  She noted provide her manager of her headache and went to the break room to sit down.  She notes that she will often have a syncopal episode related to her headache pain.  She then awoke to found her coworker standing over her, reporting that she had a syncopal episode.  She reports she is not taking any medications for her current migraine headache.  She reports some mild posterior head pain related to her fall out of a chair.  She denies any recent illness, fever, chills, or sweats.  She denies any injury related to the fall.   Physical Exam   Triage Vital Signs: ED Triage Vitals  Encounter Vitals Group     BP 11/15/23 1600 (!) 114/90     Girls Systolic BP Percentile --      Girls Diastolic BP Percentile --      Boys Systolic BP Percentile --      Boys Diastolic BP Percentile --      Pulse Rate 11/15/23 1601 80     Resp 11/15/23 1600 18     Temp 11/15/23 1600 97.7 F (36.5 C)     Temp src --      SpO2 11/15/23 1601 100 %     Weight 11/15/23 1600 180 lb (81.6 kg)     Height 11/15/23 1600 5' 4 (1.626 m)     Head Circumference --      Peak Flow --      Pain Score 11/15/23 1600 9     Pain Loc --      Pain Education --      Exclude from Growth Chart --     Most recent vital signs: Vitals:   11/15/23 1600 11/15/23 1601  BP: (!) 114/90   Pulse:  80  Resp: 18   Temp: 97.7 F (36.5 C)   SpO2:  100%     General Awake, no distress. NAD HEENT NCAT. PERRL. EOMI. No rhinorrhea. Mucous membranes are moist.  CV:  Good peripheral perfusion. RRR RESP:  Normal effort. CTA ABD:  No distention.  NEURO: Cranial nerves II to XII grossly intact.  ED Results / Procedures / Treatments   Labs (all labs ordered are listed, but only abnormal results are displayed) Labs Reviewed  BASIC METABOLIC PANEL WITH GFR - Abnormal; Notable for the following components:      Result Value   Glucose, Bld 106 (*)    All other components within normal limits  CBC    EKG  Vent. rate 83 BPM PR interval 158 ms QRS duration 92 ms QT/QTcB 398/467 ms P-R-T axes 76 -5 42 Normal sinus rhythm Cannot rule out Anterior infarct , age undetermined Abnormal ECG When compared with ECG of 21-Feb-2023 08:24, Incomplete right bundle branch block is no longer Present No STEMI  RADIOLOGY  I personally viewed and evaluated these images as  part of my medical decision making, as well as reviewing the written report by the radiologist.  ED Provider Interpretation: No acute findings  CT Head w/o CM  IMPRESSION: No acute intracranial abnormality.  PROCEDURES:  Critical Care performed: No  Procedures   MEDICATIONS ORDERED IN ED: Medications  ketorolac  (TORADOL ) 30 MG/ML injection 30 mg (30 mg Intramuscular Given 11/15/23 1659)  diphenhydrAMINE  (BENADRYL ) capsule 25 mg (25 mg Oral Given 11/15/23 1659)  metoCLOPramide  (REGLAN ) tablet 10 mg (10 mg Oral Given 11/15/23 1659)     IMPRESSION / MDM / ASSESSMENT AND PLAN / ED COURSE  I reviewed the triage vital signs and the nursing notes.                              Differential diagnosis includes, but is not limited to, intracranial hemorrhage, meningitis/encephalitis, previous head trauma, cavernous venous thrombosis, tension headache, temporal arteritis, migraine or migraine equivalent, idiopathic intracranial hypertension, and non-specific headache.  Patient's  presentation is most consistent with acute presentation with potential threat to life or bodily function.  Patient's diagnosis is consistent with migraine headache with subsequent syncopal episode.  Patient presents after single episode related to her routine migraine, and a fall from seated position.  She presents in no acute distress via EMS from work.  Exam is overall reassuring at this time.  No signs of any intractable headache at this time.  No neurodeficits are appreciated.  Head CT interpreted by me, shows no acute intracranial process.  Patient will be discharged home with prescriptions for metoclopramide  for headache management. Patient is to follow up with her PCP as needed or otherwise directed. Patient is given ED precautions to return to the ED for any worsening or new symptoms.   FINAL CLINICAL IMPRESSION(S) / ED DIAGNOSES   Final diagnoses:  Other migraine without status migrainosus, not intractable  Syncope, unspecified syncope type     Rx / DC Orders   ED Discharge Orders          Ordered    metoCLOPramide  (REGLAN ) 10 MG tablet  Every 8 hours PRN        11/15/23 1726             Note:  This document was prepared using Dragon voice recognition software and may include unintentional dictation errors.    Loyd Candida LULLA Aldona, PA-C 11/16/23 2241    Arlander Charleston, MD 11/17/23 0730

## 2023-11-15 NOTE — ED Triage Notes (Signed)
 Pt to ED [facility] from work at [employer] for possible syncopal episode, migraine. Nobody at work witnessed syncope. Has not taken migraine meds today. Reports hx of fainting

## 2023-11-15 NOTE — Discharge Instructions (Addendum)
 Your exam, labs, and CT scan are normal and reassuring at this time.  No signs of a serious injury related to your fall.  Your syncope seems to be consistent with your migraine history.  Take the prescription nausea medicine as needed for headache pain management.  Take OTC Tylenol  along with your previously prescribed medications to resolve your headache.  Follow-up with your primary provider or specialist as needed.

## 2023-12-19 ENCOUNTER — Ambulatory Visit: Admitting: Family

## 2023-12-19 ENCOUNTER — Encounter: Payer: Self-pay | Admitting: Family

## 2023-12-19 VITALS — BP 120/68 | HR 81 | Ht 64.0 in | Wt 183.0 lb

## 2023-12-19 DIAGNOSIS — G43709 Chronic migraine without aura, not intractable, without status migrainosus: Secondary | ICD-10-CM

## 2023-12-19 DIAGNOSIS — Z6831 Body mass index (BMI) 31.0-31.9, adult: Secondary | ICD-10-CM

## 2023-12-19 DIAGNOSIS — E538 Deficiency of other specified B group vitamins: Secondary | ICD-10-CM | POA: Insufficient documentation

## 2023-12-19 DIAGNOSIS — E559 Vitamin D deficiency, unspecified: Secondary | ICD-10-CM | POA: Diagnosis not present

## 2023-12-19 DIAGNOSIS — I10 Essential (primary) hypertension: Secondary | ICD-10-CM | POA: Diagnosis not present

## 2023-12-19 DIAGNOSIS — E782 Mixed hyperlipidemia: Secondary | ICD-10-CM | POA: Insufficient documentation

## 2023-12-19 DIAGNOSIS — E66811 Obesity, class 1: Secondary | ICD-10-CM

## 2023-12-19 DIAGNOSIS — R7303 Prediabetes: Secondary | ICD-10-CM | POA: Insufficient documentation

## 2023-12-19 DIAGNOSIS — E662 Morbid (severe) obesity with alveolar hypoventilation: Secondary | ICD-10-CM | POA: Insufficient documentation

## 2023-12-19 DIAGNOSIS — R5383 Other fatigue: Secondary | ICD-10-CM | POA: Insufficient documentation

## 2023-12-19 NOTE — Assessment & Plan Note (Addendum)
-   Continue healthy diet and exercise as tolerated. - Continue medications as prescribed. - Check labs today

## 2023-12-19 NOTE — Assessment & Plan Note (Addendum)
-   Continue taking medications as prescribed. - Avoid migraine triggers. - Notify office is having worsening or new symptoms associated with migraines.

## 2023-12-19 NOTE — Assessment & Plan Note (Addendum)
-   Check labs today - Supplementation recommended based off lab results and will notify patient at that time

## 2023-12-19 NOTE — Progress Notes (Signed)
 Established Patient Office Visit  Subjective:  Patient ID: Brenda Wang, female    DOB: 02-05-1991  Age: 33 y.o. MRN: [id number]  Chief Complaint  Patient presents with   Follow-up    Follow up/refills    Patient is here today for her 3 months follow up.  She has been feeling well since last appointment.   She does not have additional concerns to discuss today. He reports she has completed 2 of the Ozempic  1 mg weekly injection. She denies abdominal distress. Labs are due today.  She does not need refills.   I have reviewed her active problem list, medication list, allergies, family history, social history, health maintenance, notes from last encounter, lab results for her appointment today.    She declines wanting flu shot today.    No other concerns at this time.   Past Medical History:  Diagnosis Date   Anemia    Anxiety    H/O: cesarean section    FTP and NRFHT   Headache    migraines    Migraine    PONV (postoperative nausea and vomiting)    nausea and vomiting after 2nd c-section   Seizures (HCC)    as a child, none recently.    Past Surgical History:  Procedure Laterality Date   CESAREAN SECTION  01/23/2014   CESAREAN SECTION N/A 08/10/2015   Procedure: CESAREAN SECTION;  Surgeon: Glory High, MD;  Location: [facility] ORS;  Service: Obstetrics;  Laterality: N/A;   CESAREAN SECTION WITH BILATERAL TUBAL LIGATION Bilateral 09/26/2016   Procedure: CESAREAN SECTION WITH BILATERAL TUBAL LIGATION;  Surgeon: High Glory, MD;  Location: [facility] ORS;  Service: Obstetrics;  Laterality: Bilateral;   COLONOSCOPY WITH PROPOFOL  N/A 11/01/2020   Procedure: COLONOSCOPY WITH PROPOFOL ;  Surgeon: Therisa Bi, MD;  Location: [facility] ENDOSCOPY;  Service: Gastroenterology;  Laterality: N/A;   COLONOSCOPY WITH PROPOFOL  N/A 04/11/2021   Procedure: COLONOSCOPY WITH PROPOFOL ;  Surgeon: Therisa Bi, MD;  Location: [facility] ENDOSCOPY;  Service: Gastroenterology;  Laterality: N/A;    CYSTOSCOPY N/A 05/20/2019   Procedure: CYSTOSCOPY;  Surgeon: Arloa Lamar SHAUNNA, MD;  Location: [facility] ORS;  Service: Gynecology;  Laterality: N/A;   LAPAROSCOPIC BILATERAL SALPINGECTOMY  05/20/2019   Procedure: LAPAROSCOPIC BILATERAL SALPINGECTOMY;  Surgeon: Arloa Lamar SHAUNNA, MD;  Location: [facility] ORS;  Service: Gynecology;;   LAPAROSCOPIC LYSIS OF ADHESIONS  05/20/2019   Procedure: LAPAROSCOPIC LYSIS OF ADHESIONS;  Surgeon: Arloa Lamar SHAUNNA, MD;  Location: [facility] ORS;  Service: Gynecology;;   LAPAROSCOPY N/A 05/20/2019   Procedure: LAPAROSCOPY DIAGNOSTIC;  Surgeon: Arloa Lamar SHAUNNA, MD;  Location: [facility] ORS;  Service: Gynecology;  Laterality: N/A;   THERAPEUTIC ABORTION     TONSILLECTOMY     TUBAL LIGATION  09/26/2016   [facility]   TYMPANOSTOMY TUBE PLACEMENT Bilateral    WISDOM TOOTH EXTRACTION      Social History   Socioeconomic History   Marital status: Married    Spouse name: Not on file   Number of children: 3   Years of education: college   Highest education level: Bachelor's degree (e.g., BA, AB, BS)  Occupational History   Occupation: Teller at [employer]  Tobacco Use   Smoking status: Never   Smokeless tobacco: Never  Vaping Use   Vaping status: Never Used  Substance and Sexual Activity   Alcohol use: Yes    Alcohol/week: 7.0 standard drinks of alcohol    Types: 7 Glasses of wine per week    Comment: glass of wine daily  Drug use: Not Currently    Types: Marijuana    Comment: socially   Sexual activity: Yes    Birth control/protection: Surgical    Comment: BTL  Other Topics Concern   Not on file  Social History Narrative   Lives at home with her family.   Right-handed.   No daily use of caffeine .   Social Drivers of Corporate Investment Banker Strain: Not on file  Food Insecurity: Not on file  Transportation Needs: Not on file  Physical Activity: Not on file  Stress: Not on file  Social Connections: Not on file  Intimate Partner Violence: Not on file    Family  History  Problem Relation Age of Onset   Anemia Mother    Anxiety disorder Mother    Kidney disease Father    Hypertension Paternal Grandmother    Diabetes Paternal Grandmother    Colon cancer Paternal Grandfather    Breast cancer Other     Allergies  Allergen Reactions   Sulfa Antibiotics Hives    Review of Systems  Constitutional:  Negative for malaise/fatigue.  HENT: Negative.    Eyes:  Negative for blurred vision and pain.  Respiratory:  Negative for cough and shortness of breath.   Cardiovascular:  Negative for chest pain, palpitations, claudication and leg swelling.  Gastrointestinal:  Negative for abdominal pain, blood in stool, constipation, diarrhea, nausea and vomiting.  Genitourinary:  Negative for dysuria, frequency and urgency.  Musculoskeletal: Negative.   Skin: Negative.   Neurological:  Negative for dizziness, tingling, sensory change and headaches.  Endo/Heme/Allergies: Negative.   Psychiatric/Behavioral: Negative.         Objective:   BP 120/68   Pulse 81   Ht 5' 4 (1.626 m)   Wt 183 lb (83 kg)   SpO2 96%   BMI 31.41 kg/m   Vitals:   12/19/23 1136  BP: 120/68  Pulse: 81  Height: 5' 4 (1.626 m)  Weight: 183 lb (83 kg)  SpO2: 96%  BMI (Calculated): 31.4    Physical Exam Vitals and nursing note reviewed.  Constitutional:      Appearance: Normal appearance.  HENT:     Head: Normocephalic.  Eyes:     Extraocular Movements: Extraocular movements intact.     Pupils: Pupils are equal, round, and reactive to light.  Cardiovascular:     Rate and Rhythm: Normal rate and regular rhythm.     Pulses: Normal pulses.     Heart sounds: Normal heart sounds. No murmur heard. Pulmonary:     Effort: Pulmonary effort is normal. No respiratory distress.     Breath sounds: Normal breath sounds.  Abdominal:     General: There is no distension.     Tenderness: There is no abdominal tenderness.  Musculoskeletal:        General: No tenderness. Normal  range of motion.     Cervical back: Normal range of motion and neck supple.     Right lower leg: No edema.     Left lower leg: No edema.  Skin:    General: Skin is warm and dry.     Coloration: Skin is not jaundiced.     Findings: No erythema.  Neurological:     General: No focal deficit present.     Mental Status: She is alert and oriented to person, place, and time.  Psychiatric:        Mood and Affect: Mood normal.        Speech:  Speech normal.        Behavior: Behavior is cooperative.        Cognition and Memory: Memory is not impaired.      No results found for any visits on 12/19/23.  Recent Results (from the past 2160 hours)  Lipid panel     Status: None   Collection Time: 09/25/23 10:59 AM  Result Value Ref Range   Cholesterol, Total 116 100 - 199 mg/dL   Triglycerides 43 0 - 149 mg/dL   HDL 54 >60 mg/dL   VLDL Cholesterol Cal 11 5 - 40 mg/dL   LDL Chol Calc (NIH) 51 0 - 99 mg/dL   Chol/HDL Ratio 2.1 0.0 - 4.4 ratio    Comment:                                   T. Chol/HDL Ratio                                             Men  Women                               1/2 Avg.Risk  3.4    3.3                                   Avg.Risk  5.0    4.4                                2X Avg.Risk  9.6    7.1                                3X Avg.Risk 23.4   11.0   VITAMIN D  25 Hydroxy (Vit-D Deficiency, Fractures)     Status: Abnormal   Collection Time: 09/25/23 10:59 AM  Result Value Ref Range   Vit D, 25-Hydroxy 26.6 (L) 30.0 - 100.0 ng/mL    Comment: Vitamin D  deficiency has been defined by the Institute of Medicine and an Endocrine Society practice guideline as a level of serum 25-OH vitamin D  less than 20 ng/mL (1,2). The Endocrine Society went on to further define vitamin D  insufficiency as a level between 21 and 29 ng/mL (2). 1. IOM (Institute of Medicine). 2010. Dietary reference    intakes for calcium and D. [place]: The    [employer]. 2.  Holick MF, Binkley Liberty, Bischoff-Ferrari HA, et al.    Evaluation, treatment, and prevention of vitamin D     deficiency: an Endocrine Society clinical practice    guideline. JCEM. 2011 Jul; 96(7):1911-30.   CMP14+EGFR     Status: Abnormal   Collection Time: 09/25/23 10:59 AM  Result Value Ref Range   Glucose 81 70 - 99 mg/dL   BUN 11 6 - 20 mg/dL   Creatinine, Ser 9.07 0.57 - 1.00 mg/dL   eGFR 84 >40 fO/fpw/8.26   BUN/Creatinine Ratio 12 9 - 23   Sodium 136 134 - 144 mmol/L   Potassium 4.5 3.5 - 5.2 mmol/L   Chloride 103 96 -  106 mmol/L   CO2 20 20 - 29 mmol/L   Calcium 8.6 (L) 8.7 - 10.2 mg/dL   Total Protein 6.5 6.0 - 8.5 g/dL   Albumin 4.0 3.9 - 4.9 g/dL   Globulin, Total 2.5 1.5 - 4.5 g/dL   Bilirubin Total 0.4 0.0 - 1.2 mg/dL   Alkaline Phosphatase 72 44 - 121 IU/L   AST 17 0 - 40 IU/L   ALT 14 0 - 32 IU/L  TSH     Status: None   Collection Time: 09/25/23 10:59 AM  Result Value Ref Range   TSH 1.380 0.450 - 4.500 uIU/mL  Hemoglobin A1c     Status: None   Collection Time: 09/25/23 10:59 AM  Result Value Ref Range   Hgb A1c MFr Bld 5.4 4.8 - 5.6 %    Comment:          Prediabetes: 5.7 - 6.4          Diabetes: >6.4          Glycemic control for adults with diabetes: <7.0    Est. average glucose Bld gHb Est-mCnc 108 mg/dL  Vitamin A87     Status: None   Collection Time: 09/25/23 10:59 AM  Result Value Ref Range   Vitamin B-12 341 232 - 1,245 pg/mL  CBC with Diff     Status: Abnormal   Collection Time: 09/25/23 10:59 AM  Result Value Ref Range   WBC 6.3 3.4 - 10.8 x10E3/uL   RBC 4.33 3.77 - 5.28 x10E6/uL   Hemoglobin 12.6 11.1 - 15.9 g/dL   Hematocrit 59.3 65.9 - 46.6 %   MCV 94 79 - 97 fL   MCH 29.1 26.6 - 33.0 pg   MCHC 31.0 (L) 31.5 - 35.7 g/dL   RDW 85.6 88.2 - 84.5 %   Platelets 288 150 - 450 x10E3/uL   Neutrophils 58 Not Estab. %   Lymphs 35 Not Estab. %   Monocytes 5 Not Estab. %   Eos 1 Not Estab. %   Basos 1 Not Estab. %   Neutrophils Absolute 3.6  1.4 - 7.0 x10E3/uL   Lymphocytes Absolute 2.2 0.7 - 3.1 x10E3/uL   Monocytes Absolute 0.3 0.1 - 0.9 x10E3/uL   EOS (ABSOLUTE) 0.1 0.0 - 0.4 x10E3/uL   Basophils Absolute 0.1 0.0 - 0.2 x10E3/uL   Immature Granulocytes 0 Not Estab. %   Immature Grans (Abs) 0.0 0.0 - 0.1 x10E3/uL  CBC     Status: None   Collection Time: 11/15/23  4:03 PM  Result Value Ref Range   WBC 5.6 4.0 - 10.5 K/uL   RBC 4.53 3.87 - 5.11 MIL/uL   Hemoglobin 13.4 12.0 - 15.0 g/dL   HCT 60.6 63.9 - 53.9 %   MCV 86.8 80.0 - 100.0 fL   MCH 29.6 26.0 - 34.0 pg   MCHC 34.1 30.0 - 36.0 g/dL   RDW 87.1 88.4 - 84.4 %   Platelets 310 150 - 400 K/uL   nRBC 0.0 0.0 - 0.2 %    Comment: Performed at [facility], [address] [place], [place] [address]  Basic metabolic panel     Status: Abnormal   Collection Time: 11/15/23  4:03 PM  Result Value Ref Range   Sodium 136 135 - 145 mmol/L   Potassium 3.6 3.5 - 5.1 mmol/L   Chloride 100 98 - 111 mmol/L   CO2 24 22 - 32 mmol/L   Glucose, Bld 106 (H) 70 - 99 mg/dL  Comment: Glucose reference range applies only to samples taken after fasting for at least 8 hours.   BUN 9 6 - 20 mg/dL   Creatinine, Ser 9.17 0.44 - 1.00 mg/dL   Calcium 9.2 8.9 - 89.6 mg/dL   GFR, Estimated >39 >39 mL/min    Comment: (NOTE) Calculated using the CKD-EPI Creatinine Equation (2021)    Anion gap 12 5 - 15    Comment: Performed at [facility], [address] [place], [place] [address]       Assessment & Plan Chronic migraine w/o aura w/o status migrainosus, not intractable - Continue taking medications as prescribed. - Avoid migraine triggers. - Notify office is having worsening or new symptoms associated with migraines.  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 - Continue medications as prescribed. - Check labs today     Return in about 4 weeks (around 01/16/2024).   Total time spent: 20 minutes  Oddis DELENA Cain, FNP  12/19/2023   This document may have been prepared by [employer] Voice Recognition software and as such may include unintentional dictation errors.

## 2023-12-20 ENCOUNTER — Ambulatory Visit: Payer: Self-pay

## 2023-12-20 LAB — CBC WITH DIFFERENTIAL/PLATELET
Basophils Absolute: 0.1 x10E3/uL (ref 0.0–0.2)
Basos: 1 %
EOS (ABSOLUTE): 0 x10E3/uL (ref 0.0–0.4)
Eos: 1 %
Hematocrit: 36.6 % (ref 34.0–46.6)
Hemoglobin: 11.7 g/dL (ref 11.1–15.9)
Immature Grans (Abs): 0 x10E3/uL (ref 0.0–0.1)
Immature Granulocytes: 0 %
Lymphocytes Absolute: 2.1 x10E3/uL (ref 0.7–3.1)
Lymphs: 36 %
MCH: 29.3 pg (ref 26.6–33.0)
MCHC: 32 g/dL (ref 31.5–35.7)
MCV: 92 fL (ref 79–97)
Monocytes Absolute: 0.3 x10E3/uL (ref 0.1–0.9)
Monocytes: 6 %
Neutrophils Absolute: 3.4 x10E3/uL (ref 1.4–7.0)
Neutrophils: 56 %
Platelets: 284 x10E3/uL (ref 150–450)
RBC: 4 x10E6/uL (ref 3.77–5.28)
RDW: 13.2 % (ref 11.7–15.4)
WBC: 5.9 x10E3/uL (ref 3.4–10.8)

## 2023-12-20 LAB — CMP14+EGFR
ALT: 13 IU/L (ref 0–32)
AST: 13 IU/L (ref 0–40)
Albumin: 3.8 g/dL — ABNORMAL LOW (ref 3.9–4.9)
Alkaline Phosphatase: 60 IU/L (ref 41–116)
BUN/Creatinine Ratio: 11 (ref 9–23)
BUN: 9 mg/dL (ref 6–20)
Bilirubin Total: 0.5 mg/dL (ref 0.0–1.2)
CO2: 21 mmol/L (ref 20–29)
Calcium: 8.7 mg/dL (ref 8.7–10.2)
Chloride: 105 mmol/L (ref 96–106)
Creatinine, Ser: 0.84 mg/dL (ref 0.57–1.00)
Globulin, Total: 2.1 g/dL (ref 1.5–4.5)
Glucose: 91 mg/dL (ref 70–99)
Potassium: 3.8 mmol/L (ref 3.5–5.2)
Sodium: 140 mmol/L (ref 134–144)
Total Protein: 5.9 g/dL — ABNORMAL LOW (ref 6.0–8.5)
eGFR: 93 mL/min/1.73 (ref >59)

## 2023-12-20 LAB — LIPID PANEL
Chol/HDL Ratio: 2.1 ratio (ref 0.0–4.4)
Cholesterol, Total: 107 mg/dL (ref 100–199)
HDL: 50 mg/dL (ref >39)
LDL Chol Calc (NIH): 44 mg/dL (ref 0–99)
Triglycerides: 59 mg/dL (ref 0–149)
VLDL Cholesterol Cal: 13 mg/dL (ref 5–40)

## 2023-12-20 LAB — TSH: TSH: 0.908 u[IU]/mL (ref 0.450–4.500)

## 2023-12-20 LAB — VITAMIN B12: Vitamin B-12: 346 pg/mL (ref 232–1245)

## 2023-12-20 LAB — VITAMIN D 25 HYDROXY (VIT D DEFICIENCY, FRACTURES): Vit D, 25-Hydroxy: 13.4 ng/mL — ABNORMAL LOW (ref 30.0–100.0)

## 2023-12-20 LAB — HEMOGLOBIN A1C
Est. average glucose Bld gHb Est-mCnc: 108 mg/dL
Hgb A1c MFr Bld: 5.4 % (ref 4.8–5.6)

## 2023-12-21 ENCOUNTER — Other Ambulatory Visit: Payer: Self-pay | Admitting: Family

## 2023-12-28 ENCOUNTER — Ambulatory Visit: Admitting: Family

## 2024-01-16 ENCOUNTER — Encounter: Payer: Self-pay | Admitting: Family

## 2024-01-16 ENCOUNTER — Ambulatory Visit: Admitting: Family

## 2024-01-16 VITALS — BP 122/80 | HR 91 | Ht 64.0 in | Wt 183.6 lb

## 2024-01-16 DIAGNOSIS — E782 Mixed hyperlipidemia: Secondary | ICD-10-CM

## 2024-01-16 DIAGNOSIS — R7303 Prediabetes: Secondary | ICD-10-CM

## 2024-01-16 DIAGNOSIS — I10 Essential (primary) hypertension: Secondary | ICD-10-CM | POA: Diagnosis not present

## 2024-01-16 DIAGNOSIS — E66811 Obesity, class 1: Secondary | ICD-10-CM | POA: Insufficient documentation

## 2024-01-16 DIAGNOSIS — R5383 Other fatigue: Secondary | ICD-10-CM

## 2024-01-16 DIAGNOSIS — E559 Vitamin D deficiency, unspecified: Secondary | ICD-10-CM | POA: Diagnosis not present

## 2024-01-16 DIAGNOSIS — Z6831 Body mass index (BMI) 31.0-31.9, adult: Secondary | ICD-10-CM

## 2024-01-16 MED ORDER — VITAMIN D (ERGOCALCIFEROL) 1.25 MG (50000 UNIT) PO CAPS: 50000.0000 [IU] | ORAL_CAPSULE | ORAL | 3 refills | Status: AC

## 2024-01-16 MED ORDER — WEGOVY 1.7 MG/0.75ML ~~LOC~~ SOAJ: 1.7000 mg | SUBCUTANEOUS | 1 refills | Status: DC

## 2024-01-16 NOTE — Assessment & Plan Note (Signed)
-   Start Wegovy  1.7 mg weekly injection. - Continue healthy diet and exercise as tolerated. - Continue medications as prescribed.

## 2024-01-16 NOTE — Assessment & Plan Note (Signed)
-   Refill snet - Supplementation recommended based off lab results and will notify patient at that time

## 2024-01-16 NOTE — Progress Notes (Unsigned)
 Established Patient Office Visit  Subjective:  Patient ID: Brenda Wang, female    DOB: March 05, 1990  Age: 33 y.o. MRN: [id number]  Chief Complaint  Patient presents with   Follow-up    4 week follow up    Patient is here today for her 1 month follow up.  She has been feeling fairly well since last appointment.   She does have additional concerns to discuss today. Patient reports feeling tired. Her vitamin D  was low and she ran out of her vitamin D  supplement. Will send refill.  Patient denies any abdominal distress from Wegovy  will increase dose to 1.7 mg weekly injection. Reinforced healthy diet and exercise as tolerated.  Labs were done prior to appointment, discussed in detail today.   I have reviewed her active problem list, medication list, allergies, family history, social history, health maintenance, notes from last encounter, lab results for her appointment today.      No other concerns at this time.   Past Medical History:  Diagnosis Date   Anemia    Anxiety    H/O: cesarean section    FTP and NRFHT   Headache    migraines    Migraine    PONV (postoperative nausea and vomiting)    nausea and vomiting after 2nd c-section   Seizures (HCC)    as a child, none recently.    Past Surgical History:  Procedure Laterality Date   CESAREAN SECTION  01/23/2014   CESAREAN SECTION N/A 08/10/2015   Procedure: CESAREAN SECTION;  Surgeon: Glory High, MD;  Location: [facility] ORS;  Service: Obstetrics;  Laterality: N/A;   CESAREAN SECTION WITH BILATERAL TUBAL LIGATION Bilateral 09/26/2016   Procedure: CESAREAN SECTION WITH BILATERAL TUBAL LIGATION;  Surgeon: High Glory, MD;  Location: [facility];  Service: Obstetrics;  Laterality: Bilateral;   COLONOSCOPY WITH PROPOFOL  N/A 11/01/2020   Procedure: COLONOSCOPY WITH PROPOFOL ;  Surgeon: Therisa Bi, MD;  Location: [facility] ENDOSCOPY;  Service: Gastroenterology;  Laterality: N/A;   COLONOSCOPY WITH PROPOFOL  N/A 04/11/2021    Procedure: COLONOSCOPY WITH PROPOFOL ;  Surgeon: Therisa Bi, MD;  Location: [facility] ENDOSCOPY;  Service: Gastroenterology;  Laterality: N/A;   CYSTOSCOPY N/A 05/20/2019   Procedure: CYSTOSCOPY;  Surgeon: Arloa Lamar SHAUNNA, MD;  Location: [facility] ORS;  Service: Gynecology;  Laterality: N/A;   LAPAROSCOPIC BILATERAL SALPINGECTOMY  05/20/2019   Procedure: LAPAROSCOPIC BILATERAL SALPINGECTOMY;  Surgeon: Arloa Lamar SHAUNNA, MD;  Location: [facility] ORS;  Service: Gynecology;;   LAPAROSCOPIC LYSIS OF ADHESIONS  05/20/2019   Procedure: LAPAROSCOPIC LYSIS OF ADHESIONS;  Surgeon: Arloa Lamar SHAUNNA, MD;  Location: [facility];  Service: Gynecology;;   LAPAROSCOPY N/A 05/20/2019   Procedure: LAPAROSCOPY DIAGNOSTIC;  Surgeon: Arloa Lamar SHAUNNA, MD;  Location: [facility]S;  Service: Gynecology;  Laterality: N/A;   THERAPEUTIC ABORTION     TONSILLECTOMY     TUBAL LIGATION  09/26/2016   [facility]   TYMPANOSTOMY TUBE PLACEMENT Bilateral    WISDOM TOOTH EXTRACTION      Social History   Socioeconomic History   Marital status: Married    Spouse name: Not on file   Number of children: 3   Years of education: college   Highest education level: Bachelor's degree (e.g., BA, AB, BS)  Occupational History   Occupation: Teller at [employer]  Tobacco Use   Smoking status: Never   Smokeless tobacco: Never  Vaping Use   Vaping status: Never Used  Substance and Sexual Activity   Alcohol use: Yes    Alcohol/week: 7.0  standard drinks of alcohol    Types: 7 Glasses of wine per week    Comment: glass of wine daily   Drug use: Not Currently    Types: Marijuana    Comment: socially   Sexual activity: Yes    Birth control/protection: Surgical    Comment: BTL  Other Topics Concern   Not on file  Social History Narrative   Lives at home with her family.   Right-handed.   No daily use of caffeine .   Social Drivers of Corporate Investment Banker Strain: Not on file  Food Insecurity: Not on file  Transportation Needs: Not on file   Physical Activity: Not on file  Stress: Not on file  Social Connections: Not on file  Intimate Partner Violence: Not on file    Family History  Problem Relation Age of Onset   Anemia Mother    Anxiety disorder Mother    Kidney disease Father    Hypertension Paternal Grandmother    Diabetes Paternal Grandmother    Colon cancer Paternal Grandfather    Breast cancer Other     Allergies  Allergen Reactions   Sulfa Antibiotics Hives    Review of Systems  Constitutional:  Positive for malaise/fatigue.  HENT: Negative.    Eyes:  Negative for blurred vision and pain.  Respiratory:  Negative for cough and shortness of breath.   Cardiovascular:  Negative for chest pain, palpitations, claudication and leg swelling.  Gastrointestinal:  Negative for abdominal pain, blood in stool, constipation, diarrhea, nausea and vomiting.  Genitourinary:  Negative for dysuria, frequency and urgency.  Musculoskeletal: Negative.   Skin: Negative.   Neurological:  Negative for dizziness, tingling, sensory change and headaches.  Endo/Heme/Allergies: Negative.   Psychiatric/Behavioral: Negative.         Objective:   BP 122/80   Pulse 91   Ht 5' 4 (1.626 m)   Wt 183 lb 9.6 oz (83.3 kg)   LMP 01/03/2024 (Approximate)   SpO2 96%   BMI 31.51 kg/m   Vitals:   01/16/24 0927  BP: 122/80  Pulse: 91  Height: 5' 4 (1.626 m)  Weight: 183 lb 9.6 oz (83.3 kg)  SpO2: 96%  BMI (Calculated): 31.5    Physical Exam Vitals and nursing note reviewed.  Constitutional:      Appearance: Normal appearance.  HENT:     Head: Normocephalic.  Eyes:     Extraocular Movements: Extraocular movements intact.     Pupils: Pupils are equal, round, and reactive to light.  Cardiovascular:     Rate and Rhythm: Normal rate and regular rhythm.     Pulses: Normal pulses.     Heart sounds: Normal heart sounds. No murmur heard. Pulmonary:     Effort: Pulmonary effort is normal. No respiratory distress.      Breath sounds: Normal breath sounds.  Abdominal:     General: There is no distension.     Tenderness: There is no abdominal tenderness.  Musculoskeletal:        General: No tenderness. Normal range of motion.     Cervical back: Normal range of motion and neck supple.     Right lower leg: No edema.     Left lower leg: No edema.  Skin:    General: Skin is warm and dry.     Coloration: Skin is not jaundiced.     Findings: No erythema.  Neurological:     General: No focal deficit present.     Mental  Status: She is alert and oriented to person, place, and time.  Psychiatric:        Mood and Affect: Mood normal.        Speech: Speech normal.        Behavior: Behavior is cooperative.        Cognition and Memory: Memory is not impaired.      No results found for any visits on 01/16/24.  Recent Results (from the past 2160 hours)  CBC     Status: None   Collection Time: 11/15/23  4:03 PM  Result Value Ref Range   WBC 5.6 4.0 - 10.5 K/uL   RBC 4.53 3.87 - 5.11 MIL/uL   Hemoglobin 13.4 12.0 - 15.0 g/dL   HCT 60.6 63.9 - 53.9 %   MCV 86.8 80.0 - 100.0 fL   MCH 29.6 26.0 - 34.0 pg   MCHC 34.1 30.0 - 36.0 g/dL   RDW 87.1 88.4 - 84.4 %   Platelets 310 150 - 400 K/uL   nRBC 0.0 0.0 - 0.2 %    Comment: Performed at [facility], [address] [place], [place] [address]  Basic metabolic panel     Status: Abnormal   Collection Time: 11/15/23  4:03 PM  Result Value Ref Range   Sodium 136 135 - 145 mmol/L   Potassium 3.6 3.5 - 5.1 mmol/L   Chloride 100 98 - 111 mmol/L   CO2 24 22 - 32 mmol/L   Glucose, Bld 106 (H) 70 - 99 mg/dL    Comment: Glucose reference range applies only to samples taken after fasting for at least 8 hours.   BUN 9 6 - 20 mg/dL   Creatinine, Ser 9.17 0.44 - 1.00 mg/dL   Calcium 9.2 8.9 - 89.6 mg/dL   GFR, Estimated >39 >39 mL/min    Comment: (NOTE) Calculated using the CKD-EPI Creatinine Equation (2021)    Anion gap 12 5 - 15    Comment: Performed  at [facility], [address] [place], [place] [address]  TSH     Status: None   Collection Time: 12/19/23 12:00 PM  Result Value Ref Range   TSH 0.908 0.450 - 4.500 uIU/mL  Hemoglobin A1c     Status: None   Collection Time: 12/19/23 12:00 PM  Result Value Ref Range   Hgb A1c MFr Bld 5.4 4.8 - 5.6 %    Comment:          Prediabetes: 5.7 - 6.4          Diabetes: >6.4          Glycemic control for adults with diabetes: <7.0    Est. average glucose Bld gHb Est-mCnc 108 mg/dL  CBC with Diff     Status: None   Collection Time: 12/19/23 12:00 PM  Result Value Ref Range   WBC 5.9 3.4 - 10.8 x10E3/uL   RBC 4.00 3.77 - 5.28 x10E6/uL   Hemoglobin 11.7 11.1 - 15.9 g/dL   Hematocrit 63.3 65.9 - 46.6 %   MCV 92 79 - 97 fL   MCH 29.3 26.6 - 33.0 pg   MCHC 32.0 31.5 - 35.7 g/dL   RDW 86.7 88.2 - 84.5 %   Platelets 284 150 - 450 x10E3/uL   Neutrophils 56 Not Estab. %   Lymphs 36 Not Estab. %   Monocytes 6 Not Estab. %   Eos 1 Not Estab. %   Basos 1 Not Estab. %   Neutrophils Absolute  3.4 1.4 - 7.0 x10E3/uL   Lymphocytes Absolute 2.1 0.7 - 3.1 x10E3/uL   Monocytes Absolute 0.3 0.1 - 0.9 x10E3/uL   EOS (ABSOLUTE) 0.0 0.0 - 0.4 x10E3/uL   Basophils Absolute 0.1 0.0 - 0.2 x10E3/uL   Immature Granulocytes 0 Not Estab. %   Immature Grans (Abs) 0.0 0.0 - 0.1 x10E3/uL  Vitamin B12     Status: None   Collection Time: 12/19/23 12:00 PM  Result Value Ref Range   Vitamin B-12 346 232 - 1,245 pg/mL  VITAMIN D  25 Hydroxy (Vit-D Deficiency, Fractures)     Status: Abnormal   Collection Time: 12/19/23 12:00 PM  Result Value Ref Range   Vit D, 25-Hydroxy 13.4 (L) 30.0 - 100.0 ng/mL    Comment: Vitamin D  deficiency has been defined by the Institute of Medicine and an Endocrine Society practice guideline as a level of serum 25-OH vitamin D  less than 20 ng/mL (1,2). The Endocrine Society went on to further define vitamin D  insufficiency as a level between 21 and 29 ng/mL (2). 1. IOM  (Institute of Medicine). 2010. Dietary reference    intakes for calcium and D. [place]: The    [employer]. 2. Holick MF, [facility], Bischoff-Ferrari HA, et al.    Evaluation, treatment, and prevention of vitamin D     deficiency: an Endocrine Society clinical practice    guideline. JCEM. 2011 Jul; 96(7):1911-30.   Lipid panel     Status: None   Collection Time: 12/19/23 12:00 PM  Result Value Ref Range   Cholesterol, Total 107 100 - 199 mg/dL   Triglycerides 59 0 - 149 mg/dL   HDL 50 >60 mg/dL   VLDL Cholesterol Cal 13 5 - 40 mg/dL   LDL Chol Calc (NIH) 44 0 - 99 mg/dL   Chol/HDL Ratio 2.1 0.0 - 4.4 ratio    Comment:                                   T. Chol/HDL Ratio                                             Men  Women                               1/2 Avg.Risk  3.4    3.3                                   Avg.Risk  5.0    4.4                                2X Avg.Risk  9.6    7.1                                3X Avg.Risk 23.4   11.0   CMP14+EGFR     Status: Abnormal   Collection Time: 12/19/23 12:00 PM  Result Value Ref Range   Glucose 91 70 - 99 mg/dL   BUN 9 6 - 20 mg/dL   Creatinine,  Ser 0.84 0.57 - 1.00 mg/dL   eGFR 93 >40 fO/fpw/8.26   BUN/Creatinine Ratio 11 9 - 23   Sodium 140 134 - 144 mmol/L   Potassium 3.8 3.5 - 5.2 mmol/L   Chloride 105 96 - 106 mmol/L   CO2 21 20 - 29 mmol/L   Calcium 8.7 8.7 - 10.2 mg/dL   Total Protein 5.9 (L) 6.0 - 8.5 g/dL   Albumin 3.8 (L) 3.9 - 4.9 g/dL   Globulin, Total 2.1 1.5 - 4.5 g/dL   Bilirubin Total 0.5 0.0 - 1.2 mg/dL   Alkaline Phosphatase 60 41 - 116 IU/L   AST 13 0 - 40 IU/L   ALT 13 0 - 32 IU/L       Assessment & Plan:   Assessment & Plan Vitamin D  deficiency, unspecified Other fatigue - Refill snet - Supplementation recommended based off lab results and will notify patient at that time  Essential hypertension, benign Class 1 obesity with serious comorbidity and body mass index (BMI) of  31.0 to 31.9 in adult, unspecified obesity type Prediabetes Mixed hyperlipidemia - Start Wegovy  1.7 mg weekly injection. - Continue healthy diet and exercise as tolerated. - Continue medications as prescribed.     Return in about 3 months (around 04/15/2024).   Total time spent: 25 minutes  Oddis DELENA Cain, FNP  01/16/2024   This document may have been prepared by [facility] Voice Recognition software and as such may include unintentional dictation errors.

## 2024-01-18 ENCOUNTER — Encounter: Payer: Self-pay | Admitting: Family

## 2024-03-13 ENCOUNTER — Other Ambulatory Visit: Payer: Self-pay

## 2024-03-13 DIAGNOSIS — R7303 Prediabetes: Secondary | ICD-10-CM

## 2024-03-13 DIAGNOSIS — E66811 Obesity, class 1: Secondary | ICD-10-CM

## 2024-04-15 ENCOUNTER — Ambulatory Visit: Admitting: Family
